# Patient Record
Sex: Female | Born: 1955
Health system: Southern US, Community
[De-identification: ages and names within clinical notes are randomized; demographics above are authoritative.]

## PROBLEM LIST (undated history)

## (undated) DIAGNOSIS — K219 Gastro-esophageal reflux disease without esophagitis: Secondary | ICD-10-CM

## (undated) DIAGNOSIS — IMO0002 Reserved for concepts with insufficient information to code with codable children: Secondary | ICD-10-CM

## (undated) DIAGNOSIS — G5603 Carpal tunnel syndrome, bilateral upper limbs: Secondary | ICD-10-CM

## (undated) DIAGNOSIS — T7840XA Allergy, unspecified, initial encounter: Secondary | ICD-10-CM

## (undated) DIAGNOSIS — F329 Major depressive disorder, single episode, unspecified: Secondary | ICD-10-CM

## (undated) DIAGNOSIS — F419 Anxiety disorder, unspecified: Secondary | ICD-10-CM

## (undated) DIAGNOSIS — M1990 Unspecified osteoarthritis, unspecified site: Secondary | ICD-10-CM

## (undated) DIAGNOSIS — M5430 Sciatica, unspecified side: Secondary | ICD-10-CM

## (undated) DIAGNOSIS — S46009A Unspecified injury of muscle(s) and tendon(s) of the rotator cuff of unspecified shoulder, initial encounter: Secondary | ICD-10-CM

## (undated) DIAGNOSIS — Z720 Tobacco use: Secondary | ICD-10-CM

## (undated) DIAGNOSIS — R0602 Shortness of breath: Secondary | ICD-10-CM

## (undated) DIAGNOSIS — F32A Depression, unspecified: Secondary | ICD-10-CM

## (undated) HISTORY — DX: Anxiety disorder, unspecified: F41.9

## (undated) HISTORY — PX: UPPER GASTROINTESTINAL ENDOSCOPY: SHX188

## (undated) HISTORY — DX: Gastro-esophageal reflux disease without esophagitis: K21.9

## (undated) HISTORY — DX: Sciatica, unspecified side: M54.30

## (undated) HISTORY — DX: Unspecified injury of muscle(s) and tendon(s) of the rotator cuff of unspecified shoulder, initial encounter: S46.009A

## (undated) HISTORY — DX: Unspecified osteoarthritis, unspecified site: M19.90

## (undated) HISTORY — DX: Depression, unspecified: F32.A

## (undated) HISTORY — PX: APPENDECTOMY: SHX54

## (undated) HISTORY — DX: Allergy, unspecified, initial encounter: T78.40XA

## (undated) HISTORY — DX: Carpal tunnel syndrome, bilateral upper limbs: G56.03

## (undated) HISTORY — DX: Tobacco use: Z72.0

## (undated) HISTORY — PX: OTHER SURGICAL HISTORY: SHX169

## (undated) HISTORY — DX: Reserved for concepts with insufficient information to code with codable children: IMO0002

## (undated) HISTORY — PX: EXPLORATORY LAPAROTOMY: SUR591

## (undated) HISTORY — DX: Major depressive disorder, single episode, unspecified: F32.9

---

## 1997-09-13 ENCOUNTER — Emergency Department (HOSPITAL_COMMUNITY): Admission: EM | Admit: 1997-09-13 | Discharge: 1997-09-13 | Payer: Self-pay | Admitting: Emergency Medicine

## 2003-05-22 ENCOUNTER — Ambulatory Visit (HOSPITAL_COMMUNITY): Admission: RE | Admit: 2003-05-22 | Discharge: 2003-05-22 | Payer: Self-pay | Admitting: Family Medicine

## 2003-09-26 ENCOUNTER — Emergency Department (HOSPITAL_COMMUNITY): Admission: EM | Admit: 2003-09-26 | Discharge: 2003-09-26 | Payer: Self-pay | Admitting: Emergency Medicine

## 2007-10-17 ENCOUNTER — Emergency Department (HOSPITAL_COMMUNITY): Admission: EM | Admit: 2007-10-17 | Discharge: 2007-10-17 | Payer: Self-pay | Admitting: Emergency Medicine

## 2008-08-23 ENCOUNTER — Emergency Department (HOSPITAL_COMMUNITY): Admission: EM | Admit: 2008-08-23 | Discharge: 2008-08-23 | Payer: Self-pay | Admitting: Emergency Medicine

## 2008-12-27 ENCOUNTER — Ambulatory Visit: Payer: Self-pay | Admitting: Internal Medicine

## 2008-12-27 ENCOUNTER — Encounter (INDEPENDENT_AMBULATORY_CARE_PROVIDER_SITE_OTHER): Payer: Self-pay | Admitting: Internal Medicine

## 2008-12-27 DIAGNOSIS — F329 Major depressive disorder, single episode, unspecified: Secondary | ICD-10-CM | POA: Insufficient documentation

## 2008-12-30 ENCOUNTER — Emergency Department (HOSPITAL_COMMUNITY): Admission: EM | Admit: 2008-12-30 | Discharge: 2008-12-30 | Payer: Self-pay | Admitting: Emergency Medicine

## 2009-01-21 ENCOUNTER — Ambulatory Visit: Payer: Self-pay | Admitting: Internal Medicine

## 2009-01-21 LAB — CONVERTED CEMR LAB
ALT: 14 units/L (ref 0–35)
BUN: 13 mg/dL (ref 6–23)
CO2: 23 meq/L (ref 19–32)
Calcium: 9.8 mg/dL (ref 8.4–10.5)
Chloride: 108 meq/L (ref 96–112)
Cholesterol: 225 mg/dL — ABNORMAL HIGH (ref 0–200)
Creatinine, Ser: 0.82 mg/dL (ref 0.40–1.20)
Eosinophils Absolute: 0.1 10*3/uL (ref 0.0–0.7)
Eosinophils Relative: 2 % (ref 0–5)
Glucose, Bld: 96 mg/dL (ref 70–99)
HCT: 41.7 % (ref 36.0–46.0)
HDL: 89 mg/dL (ref >39)
Lymphs Abs: 2.4 10*3/uL (ref 0.7–4.0)
MCHC: 31.9 g/dL (ref 30.0–36.0)
MCV: 93.1 fL (ref >=78.0)
Monocytes Absolute: 0.4 10*3/uL (ref 0.1–1.0)
Monocytes Relative: 5 % (ref 3–12)
RBC: 4.48 M/uL (ref 3.87–5.11)
Total CHOL/HDL Ratio: 2.5
Triglycerides: 71 mg/dL (ref <150)
WBC: 7.8 10*3/uL (ref 4.0–10.5)

## 2009-02-08 ENCOUNTER — Ambulatory Visit: Payer: Self-pay | Admitting: Infectious Diseases

## 2009-02-08 DIAGNOSIS — M47816 Spondylosis without myelopathy or radiculopathy, lumbar region: Secondary | ICD-10-CM

## 2010-06-12 LAB — URINALYSIS, ROUTINE W REFLEX MICROSCOPIC
Glucose, UA: NEGATIVE mg/dL
Hgb urine dipstick: NEGATIVE
Protein, ur: NEGATIVE mg/dL

## 2010-07-30 ENCOUNTER — Encounter: Payer: Self-pay | Admitting: Internal Medicine

## 2010-07-30 DIAGNOSIS — G5603 Carpal tunnel syndrome, bilateral upper limbs: Secondary | ICD-10-CM | POA: Insufficient documentation

## 2010-07-30 DIAGNOSIS — S46009A Unspecified injury of muscle(s) and tendon(s) of the rotator cuff of unspecified shoulder, initial encounter: Secondary | ICD-10-CM | POA: Insufficient documentation

## 2010-07-30 DIAGNOSIS — M5430 Sciatica, unspecified side: Secondary | ICD-10-CM | POA: Insufficient documentation

## 2010-09-18 ENCOUNTER — Encounter: Payer: Self-pay | Admitting: Internal Medicine

## 2010-10-27 ENCOUNTER — Encounter: Payer: Self-pay | Admitting: Internal Medicine

## 2010-11-03 ENCOUNTER — Encounter: Payer: Self-pay | Admitting: Internal Medicine

## 2010-12-26 ENCOUNTER — Ambulatory Visit (INDEPENDENT_AMBULATORY_CARE_PROVIDER_SITE_OTHER): Payer: Self-pay | Admitting: Internal Medicine

## 2010-12-26 ENCOUNTER — Encounter: Payer: Self-pay | Admitting: Internal Medicine

## 2010-12-26 VITALS — BP 119/73 | HR 117 | Temp 99.1°F | Wt 177.4 lb

## 2010-12-26 DIAGNOSIS — K219 Gastro-esophageal reflux disease without esophagitis: Secondary | ICD-10-CM | POA: Insufficient documentation

## 2010-12-26 DIAGNOSIS — F329 Major depressive disorder, single episode, unspecified: Secondary | ICD-10-CM

## 2010-12-26 DIAGNOSIS — J45909 Unspecified asthma, uncomplicated: Secondary | ICD-10-CM | POA: Insufficient documentation

## 2010-12-26 DIAGNOSIS — Z23 Encounter for immunization: Secondary | ICD-10-CM

## 2010-12-26 DIAGNOSIS — Z299 Encounter for prophylactic measures, unspecified: Secondary | ICD-10-CM

## 2010-12-26 DIAGNOSIS — F3289 Other specified depressive episodes: Secondary | ICD-10-CM

## 2010-12-26 LAB — CBC
HCT: 40.8 % (ref 36.0–46.0)
Hemoglobin: 13.4 g/dL (ref 12.0–15.0)
MCH: 29.6 pg (ref 26.0–34.0)
MCHC: 32.8 g/dL (ref 30.0–36.0)
MCV: 90.3 fL (ref 78.0–100.0)

## 2010-12-26 MED ORDER — ALBUTEROL SULFATE HFA 108 (90 BASE) MCG/ACT IN AERS: 2.0000 | INHALATION_SPRAY | Freq: Four times a day (QID) | RESPIRATORY_TRACT | Status: DC | PRN

## 2010-12-26 MED ORDER — PREDNISONE (PAK) 10 MG PO TABS: 10.0000 mg | ORAL_TABLET | Freq: Every day | ORAL | Status: AC

## 2010-12-26 MED ORDER — OMEPRAZOLE 40 MG PO CPDR: DELAYED_RELEASE_CAPSULE | ORAL | Status: DC

## 2010-12-26 NOTE — Progress Notes (Signed)
Subjective:   Patient ID: Tabitha Wilcox female   DOB: [birth year] 55 y.o.   MRN: [id number]  HPI: Tabitha Wilcox is a 55 y.o.  Female with PMH significant as outlined below who presented to the clinic for regular office visit and refill of medication. Patient has been here since 2 years since she moved to [place] to help her daughter taking care of the granddaughter. She noted she is doing fine except: 1. Trouble of swolling: this has been going on since 2 years. She noted that taking Prilosec 20 mg daily has improved symptoms somewhat but she continues to have symptoms that something is stuck in her throat. No difference between liquid or solid. She further noted that she had one episode of vomiting and occasionally epigastric pain and nausea. She denies any constipation, diarrhea, weight loss. She does smoke 0.5 pack a day since many years.   2. SOB and dry cough since 2 weeks the patient is using her inhaler more often especially in the night. Denies any fevers, recent travel. Refulx. Stuck things in her throat . Vomiting. Epigastric pain. Vomiting episodes.     Past Medical History  Diagnosis Date  . Asthma   . Carpal tunnel syndrome on both sides   . Rotator cuff injury     Right side  . Sciatica   . Tobacco abuse   . Depression    Current Outpatient Prescriptions  Medication Sig Dispense Refill  . omeprazole (PRILOSEC) 20 MG capsule Take 20 mg by mouth daily. Take one pill by mouth until you take naprosyn       . QUEtiapine (SEROQUEL) 200 MG tablet Take 200 mg by mouth at bedtime.         Family History  Problem Relation Age of Onset  . Colon cancer Maternal Aunt   . Colon cancer Paternal Uncle    History   Social History  . Marital Status: Divorced    Spouse Name: N/A    Number of Children: 3  . Years of Education: N/A   Social History Main Topics  . Smoking status: Smoker, Current Status Unknown -- 0.5 packs/day    Types: Cigarettes  . Smokeless tobacco: None    . Alcohol Use: Yes     Drinks beer occasionally (beer once or twice a week)  . Drug Use: No     stopped since 2 1/2 years. Before using Marijuana.   . Sexually Active: None   Other Topics Concern  . 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 Daughter, 35Son, 33Son, 61 (Deceased, shot at fathers home in 2007)Does not exercise anymore due to foot/sciatica pain   Review of Systems: Constitutional: Denies fever, chills, diaphoresis, appetite change and fatigue.  HEENT: Denies photophobia, eye pain, redness, hearing loss, ear pain, congestion, sore throat, rhinorrhea, sneezing, mouth sores, neck pain, neck stiffness and tinnitus.   Cardiovascular: Denies chest pain, palpitations and leg swelling.  Genitourinary: Denies dysuria, urgency, frequency, hematuria, flank pain and difficulty urinating.  Musculoskeletal: Denies myalgias, back pain, joint swelling, arthralgias and gait problem.  Skin: Denies pallor, rash and wound.  Neurological: Denies dizziness, seizures, syncope, weakness, light-headedness, numbness and headaches.  Hematological: Denies adenopathy. Easy bruising, personal or family bleeding history    Objective:  Physical Exam: Filed Vitals:   12/26/10 1613  BP: 119/73  Pulse: 117  Temp: 99.1 F (37.3 C)  TempSrc: Oral  Weight: 177 lb 6.4 oz (80.468 kg)   Constitutional: Vital signs reviewed.  Patient is a well-developed and well-nourished  in no acute distress and cooperative with exam. Alert and oriented x3.  Mouth: no erythema or exudates, MMM  Neck: Supple, Trachea midline normal ROM, No JVD, mass, thyromegaly,  Cardiovascular: RRR, S1 normal, S2 normal, no MRG, pulses symmetric and intact bilaterally Pulmonary/Chest:  Wheezes bilaterally noted  But no rales, or rhonchi Abdominal: Soft.  Non-tender, non-distended, bowel sounds are normal, no masses, organomegaly, or guarding present.  GU: no CVA tenderness Musculoskeletal: No joint deformities, erythema, or stiffness, ROM full and no nontender Neurological: A&O x3, no focal motor deficit, sensory intact to light touch bilaterally.  Skin: Warm, dry and intact. No rash, cyanosis, or clubbing.

## 2010-12-27 ENCOUNTER — Encounter: Payer: Self-pay | Admitting: Internal Medicine

## 2010-12-27 DIAGNOSIS — Z299 Encounter for prophylactic measures, unspecified: Secondary | ICD-10-CM | POA: Insufficient documentation

## 2010-12-27 LAB — COMPREHENSIVE METABOLIC PANEL
AST: 33 U/L (ref 0–37)
Albumin: 5 g/dL (ref 3.5–5.2)
CO2: 23 mEq/L (ref 19–32)
Glucose, Bld: 123 mg/dL — ABNORMAL HIGH (ref 70–99)
Sodium: 138 mEq/L (ref 135–145)
Total Bilirubin: 0.6 mg/dL (ref 0.3–1.2)
Total Protein: 8 g/dL (ref 6.0–8.3)

## 2010-12-27 LAB — LIPID PANEL
Cholesterol: 208 mg/dL — ABNORMAL HIGH (ref 0–200)
Triglycerides: 105 mg/dL (ref <150)
VLDL: 21 mg/dL (ref 0–40)

## 2010-12-27 NOTE — Assessment & Plan Note (Signed)
Flu vaccination given. Consider mammogram referral and colonoscopy if orange card is approved.

## 2010-12-27 NOTE — Assessment & Plan Note (Signed)
Dysphagia is likley related to uncontrolled GERD. Will increase proazac to 40 mg BID for one month and then 40 mg once a day. If symptoms are uncontrolled at the next office visit consider a Barium Swallow to evaluate Dysmotility , lesion or strictures . Patient has current self pay only.

## 2010-12-27 NOTE — Assessment & Plan Note (Signed)
Patient has no financial recourses at this point to pay for any inhaled steroids to control her Asthma therefore I will prescribe a 10 day course of prednisone will follow up on her insurance situation. She wanted to try to contact Rudell Cobb to assist with orange card. At that point I would consider PFT and start inhaled steroid.

## 2011-02-02 ENCOUNTER — Encounter: Payer: Self-pay | Admitting: Internal Medicine

## 2011-03-12 ENCOUNTER — Ambulatory Visit (INDEPENDENT_AMBULATORY_CARE_PROVIDER_SITE_OTHER): Payer: Self-pay | Admitting: Internal Medicine

## 2011-03-12 ENCOUNTER — Encounter: Payer: Self-pay | Admitting: Internal Medicine

## 2011-03-12 DIAGNOSIS — M25579 Pain in unspecified ankle and joints of unspecified foot: Secondary | ICD-10-CM

## 2011-03-12 DIAGNOSIS — Z299 Encounter for prophylactic measures, unspecified: Secondary | ICD-10-CM

## 2011-03-12 DIAGNOSIS — K222 Esophageal obstruction: Secondary | ICD-10-CM | POA: Insufficient documentation

## 2011-03-12 DIAGNOSIS — R1310 Dysphagia, unspecified: Secondary | ICD-10-CM

## 2011-03-12 NOTE — Assessment & Plan Note (Signed)
Pap smear deferred to next office visit per patient

## 2011-03-12 NOTE — Assessment & Plan Note (Signed)
Patient has completed a one-month course of PPI twice a day which somehow improved her symptoms but as soon as she is only taking once a day her symptoms started to worsen. Obtain them swallow evaluation for any strictures or lesions to assist with management. Patient was noted to have mild elevation of calcium.

## 2011-03-12 NOTE — Progress Notes (Signed)
  Subjective:   Patient ID: Tabitha Wilcox female   DOB: Aug 06, 1955 56 y.o.   MRN: [id number]  HPI: [name] is a 56 y.o. female with past history significant as outlined below who presented to the clinic with bilateral ankle pain. She noted she had sprained her ankle multiple times in the past and since then she feels that her ankles are kind of in stable. As soon as she takes the wrong step she would twist her ankle which happened recently again. She was a noted swelling and pain which was nonsteroidal and elevation. She also reports about pain radiating to her knee and some cramps in her legs.   She was seen during the last office visit for dysphagia. She was started on PPI twice a day. Patient reports that his symptoms had improved slightly but not since she has been taking only once a day she has some episodes where she has the feeling that her food gets stuck . Should be able to get her or in shot next week.     Past Medical History  Diagnosis Date  . Asthma   . Carpal tunnel syndrome on both sides   . Rotator cuff injury     Right side  . Sciatica   . Tobacco abuse   . Depression    Dysphagia    . GERD (gastroesophageal reflux disease)    Current Outpatient Prescriptions  Medication Sig Dispense Refill  . albuterol (PROVENTIL HFA;VENTOLIN HFA) 108 (90 BASE) MCG/ACT inhaler Inhale 2 puffs into the lungs every 6 (six) hours as needed.  1 Inhaler  0  . omeprazole (PRILOSEC) 40 MG capsule Take 40 mg by mouth daily.        . QUEtiapine (SEROQUEL) 200 MG tablet Take 200 mg by mouth at bedtime.         Review of Systems: Constitutional: Denies fever, chills, diaphoresis, appetite change and fatigue.  HEENT: Denies , congestion, sore throat, rhinorrhea, sneezing, mouth sores,  neck pain, neck stiffness and tinnitus.   Respiratory: Denies SOB, DOE, cough, chest tightness,  and wheezing.   Cardiovascular: Denies chest pain, palpitations and leg swelling.  Gastrointestinal: Denies  nausea, vomiting, abdominal pain, diarrhea, constipation, blood in stool and abdominal distention.  Skin: Denies pallor, rash and wound.   Objective:  Physical Exam: Filed Vitals:   03/12/11 1542  BP: 127/81  Pulse: 90  Temp: 97.5 F (36.4 C)  Height: 5\' 2"  (1.575 m)  Weight: 178 lb (80.74 kg)  SpO2: 96%   Constitutional: Vital signs reviewed.  Patient is a well-developed and well-nourished female in no acute distress and cooperative with exam. Alert and oriented x3.  Mouth: no erythema or exudates, MMM  Neck: Supple,  Cardiovascular: RRR, S1 normal, S2 normal, no MRG, pulses symmetric and intact bilaterally Pulmonary/Chest: CTAB, no wheezes, rales, or rhonchi Abdominal: Soft. Non-tender, non-distended, bowel sounds are normal, no masses, organomegaly, or guarding present.  Musculoskeletal: Bialteral joint swelling noted ,without erythema, or stiffness. Normal ROM full  Neurological: A&O x3, no focal motor deficit, sensory intact to light touch bilaterally.  Skin: Warm, dry and intact. No rash, cyanosis, or clubbing.

## 2011-03-12 NOTE — Assessment & Plan Note (Signed)
Noted bilateral pain. I will obtain x-rays of the ankle and recommended bandages or splints over the counter. Furthermore recommended when necessary nonsteroidal if needed. May consider to refer her to sports medicine as soon as patient has insurance.

## 2011-03-19 ENCOUNTER — Other Ambulatory Visit: Payer: Self-pay | Admitting: Internal Medicine

## 2011-03-19 DIAGNOSIS — M25579 Pain in unspecified ankle and joints of unspecified foot: Secondary | ICD-10-CM

## 2011-03-19 NOTE — Assessment & Plan Note (Signed)
She received orange card. He is also now complaining about knee pain when she walked to the front desk. I will refer patient for further evaluation to sports medicine. I would not obtain the x-rays at this point tender for management to sports medicine. Appreciate their assistance.

## 2011-03-24 ENCOUNTER — Other Ambulatory Visit (HOSPITAL_COMMUNITY): Payer: Self-pay

## 2011-03-27 ENCOUNTER — Ambulatory Visit (HOSPITAL_COMMUNITY)
Admission: RE | Admit: 2011-03-27 | Discharge: 2011-03-27 | Disposition: A | Discharge status: Home / Self Care | Payer: Self-pay | Source: Ambulatory Visit | Attending: Internal Medicine | Admitting: Internal Medicine

## 2011-03-27 DIAGNOSIS — R1310 Dysphagia, unspecified: Secondary | ICD-10-CM | POA: Insufficient documentation

## 2011-03-30 ENCOUNTER — Ambulatory Visit (INDEPENDENT_AMBULATORY_CARE_PROVIDER_SITE_OTHER): Payer: Self-pay | Admitting: Family Medicine

## 2011-03-30 VITALS — BP 130/70 | Ht 62.0 in | Wt 178.0 lb

## 2011-03-30 DIAGNOSIS — M25579 Pain in unspecified ankle and joints of unspecified foot: Secondary | ICD-10-CM

## 2011-03-30 DIAGNOSIS — M25562 Pain in left knee: Secondary | ICD-10-CM

## 2011-03-30 DIAGNOSIS — M25569 Pain in unspecified knee: Secondary | ICD-10-CM

## 2011-03-30 MED ORDER — MELOXICAM 7.5 MG PO TABS: 7.5000 mg | ORAL_TABLET | Freq: Every day | ORAL | Status: DC

## 2011-03-31 DIAGNOSIS — M25561 Pain in right knee: Secondary | ICD-10-CM | POA: Insufficient documentation

## 2011-03-31 NOTE — Progress Notes (Signed)
  Subjective:    Patient ID: Tabitha Wilcox, female    DOB: April 19, 1955, 56 y.o.   MRN: [id number]  HPI  Pain in bilateral knees right greater than left and bilateral ankles for 5 or more years. His head at least one prior sprain on the right ankle but no other significant injuries to either ankles or knees.  Ankles feel weak. After she does a lot of walking they ache. She feels like they're unstable. The right knee hurts worse than the left feels stiff and sometimes swollen. She has not noted any erythema or redness of the knee.  Review of Systems Denies unusual weight change, fever, sweats, chills. She does admit to bilateral lower extremity burning sensation at night that's intermittent and goes from her knees down to her ankles and toes.    Objective:   Physical Exam  Vital signs reviewed. GENERAL: Well developed, well nourished, no acute distress Knees: Right knee has a well-healed old superficial scar above the patella. There is mild tenderness to palpation of the lateral joint line greater than the medial joint line. There is no effusion. There is some crepitus. Bilaterally hair knees are ligamentously intact to varus and valgus stress, anterior drawer. Negative memory bilaterally. The left knee has some crepitus but the skin over the kneecap is normal. She has full range of motion in flexion and extension of both knees. ANKLES: Anterior drawer has distinct endpoint on the right, slightly less distinct endpoint on the left. Ankles are otherwise ligamentously intact. There is no significant joint swelling, no warmth or erythema. Sensation is intact to soft touch in bilateral feet. She is unable to do a heel raise and double leg stance. She cannot do heel raise and single stance on either foot. LOWER leg: Bilateral calves are soft. The popliteal space bilaterally is normal.      Assessment & Plan:  Multiple joint pains. I think the ankle issues are related to generalized weakness and have  put her on a home exercise program. I will see her back in 2-3 weeks for followup of that #2. She has some joint line tenderness on the right knee. Will get bilateral standing knees to evaluate for joint space loss. I'll see her back in 2-3 weeks

## 2011-04-01 ENCOUNTER — Ambulatory Visit (HOSPITAL_COMMUNITY)
Admission: RE | Admit: 2011-04-01 | Discharge: 2011-04-01 | Disposition: A | Discharge status: Home / Self Care | Payer: Self-pay | Source: Ambulatory Visit | Attending: Family Medicine | Admitting: Family Medicine

## 2011-04-01 DIAGNOSIS — IMO0002 Reserved for concepts with insufficient information to code with codable children: Secondary | ICD-10-CM | POA: Insufficient documentation

## 2011-04-01 DIAGNOSIS — M25569 Pain in unspecified knee: Secondary | ICD-10-CM | POA: Insufficient documentation

## 2011-04-01 DIAGNOSIS — M25561 Pain in right knee: Secondary | ICD-10-CM

## 2011-04-01 DIAGNOSIS — M1710 Unilateral primary osteoarthritis, unspecified knee: Secondary | ICD-10-CM | POA: Insufficient documentation

## 2011-04-13 ENCOUNTER — Encounter: Payer: Self-pay | Admitting: Internal Medicine

## 2011-04-13 ENCOUNTER — Ambulatory Visit (INDEPENDENT_AMBULATORY_CARE_PROVIDER_SITE_OTHER): Payer: Self-pay | Admitting: Family Medicine

## 2011-04-13 ENCOUNTER — Ambulatory Visit (INDEPENDENT_AMBULATORY_CARE_PROVIDER_SITE_OTHER): Payer: Self-pay | Admitting: Internal Medicine

## 2011-04-13 DIAGNOSIS — R1310 Dysphagia, unspecified: Secondary | ICD-10-CM

## 2011-04-13 DIAGNOSIS — F17200 Nicotine dependence, unspecified, uncomplicated: Secondary | ICD-10-CM

## 2011-04-13 DIAGNOSIS — J45909 Unspecified asthma, uncomplicated: Secondary | ICD-10-CM

## 2011-04-13 DIAGNOSIS — M25579 Pain in unspecified ankle and joints of unspecified foot: Secondary | ICD-10-CM

## 2011-04-13 DIAGNOSIS — Z720 Tobacco use: Secondary | ICD-10-CM | POA: Insufficient documentation

## 2011-04-13 DIAGNOSIS — M25562 Pain in left knee: Secondary | ICD-10-CM

## 2011-04-13 DIAGNOSIS — M25569 Pain in unspecified knee: Secondary | ICD-10-CM

## 2011-04-13 DIAGNOSIS — Z1231 Encounter for screening mammogram for malignant neoplasm of breast: Secondary | ICD-10-CM | POA: Insufficient documentation

## 2011-04-13 MED ORDER — MELOXICAM 15 MG PO TABS: ORAL_TABLET | ORAL | Status: DC

## 2011-04-13 MED ORDER — ALBUTEROL SULFATE HFA 108 (90 BASE) MCG/ACT IN AERS: 2.0000 | INHALATION_SPRAY | Freq: Four times a day (QID) | RESPIRATORY_TRACT | Status: DC | PRN

## 2011-04-13 NOTE — Patient Instructions (Signed)
Increase your meloxicam to twice a day (total of 15 mg a day) ADD two 500 mg tylenol --take two tabs three times a day. If you start having worse stomach problems, stop the meloxicam and call me. See me in 3 months. GREAT work on the ankle exercises---start the knee rehab!

## 2011-04-13 NOTE — Assessment & Plan Note (Signed)
Will refer patient for mammogram. 

## 2011-04-13 NOTE — Assessment & Plan Note (Signed)
History of asthma but no PFT in the records. The patient noted using albuterol more often. I will obtain PFT for further evaluation and management.

## 2011-04-13 NOTE — Progress Notes (Signed)
Subjective:   Patient ID: Tabitha Wilcox female   DOB: 1955/04/23 56 y.o.   MRN: [id number]  HPI: [name] is a 56 y.o. female with past medical history significant as outlined below who presented to the clinic for an office visit. 1. Ankle and knee pain: Patient was evaluated by sports medicine and was provided Mobic initiate 7.5 mg now increased to 50 mg. Furthermore recommended stretching exercises which improved patient's ankle swelling and pain. X-rays of the knees was obtained which showed some mild degenerative disease. 2. Dysphagia; Barium swallow was obtained which showed mild smooth stricture in gastroesophageal junction it did  not reproduce her symptoms. She is currently drinking a lot of fluids if she is taking any medication and cutting her food into small pieces to prevent feeling of swallow  Problems. 3. Tobacco abuse: has made a quit date with the help of 1-800-QUIT NOW number. The date is 04/18/11    Past Medical History  Diagnosis Date  . Asthma   . Carpal tunnel syndrome on both sides   . Rotator cuff injury     Right side  . Sciatica   . Tobacco abuse   . Depression   . Diabetes mellitus   . GERD (gastroesophageal reflux disease)    Current Outpatient Prescriptions  Medication Sig Dispense Refill  . albuterol (PROVENTIL HFA;VENTOLIN HFA) 108 (90 BASE) MCG/ACT inhaler Inhale 2 puffs into the lungs every 6 (six) hours as needed.  1 Inhaler  0  . meloxicam (MOBIC) 15 MG tablet Take 7.5 mg (one half tablet) by mouth twice daily  30 tablet  3  . omeprazole (PRILOSEC) 40 MG capsule Take 40 mg by mouth daily.        . QUEtiapine (SEROQUEL) 200 MG tablet Take 200 mg by mouth at bedtime.         Family History  Problem Relation Age of Onset  . Colon cancer Maternal Aunt   . Colon cancer Paternal Uncle    History   Social History  . Marital Status: Divorced    Spouse Name: N/A    Number of Children: 3  . Years of Education: N/A   Social History Main Topics   . Smoking status: Smoker, Current Status Unknown -- 0.5 packs/day    Types: Cigarettes  . Smokeless tobacco: None   Comment: Planning on quitting soon.  . Alcohol Use: Yes     Drinks beer occasionally (beer once or twice a week)  . Drug Use: No     stopped since 2 1/2 years. Before using Marijuana.   . Sexually Active: None   Other Topics Concern  . 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 Daughter, 35Son, 33Son, 74 (Deceased, shot at fathers home in 2007)Does not exercise anymore due to foot/sciatica pain   Review of Systems: Constitutional: Denies fever, chills, diaphoresis, appetite change and fatigue.  Respiratory: Denies SOB, DOE, cough, chest tightness,  and wheezing.   Cardiovascular: Denies chest pain, palpitations and leg swelling.  Gastrointestinal: Denies nausea, vomiting, abdominal pain, diarrhea, constipation, blood in stool and abdominal distention.  Genitourinary: Denies dysuria, urgency, frequency, hematuria, flank pain and difficulty urinating.  Skin: Denies pallor, rash and wound.  Neurological: Denies dizziness,  numbness and headaches.    Objective:  Physical Exam: Filed Vitals:   04/13/11 1440  BP: 132/82  Pulse: 90  Temp: 97.3 F (36.3 C)  TempSrc: Oral  Height: 5\' 3"  (1.6 m)  Weight: 176 lb 12.8 oz (80.196 kg)   Constitutional: Vital signs reviewed.  Patient is a well-developed and well-nourished  in no acute distress and cooperative with exam. Alert and oriented x3.  Neck: Supple,  Cardiovascular: RRR, S1 normal, S2 normal, no MRG, pulses symmetric and intact bilaterally Pulmonary/Chest: CTAB, no wheezes, rales, or rhonchi Abdominal: Soft. Non-tender, non-distended, bowel sounds are normal,  GU: no CVA tenderness Musculoskeletal: No joint deformities, erythema, or  stiffness, ROM full and no nontender Hematology: no cervical,  Neurological: A&O x3, Strenght is normal and symmetric bilaterally, , no focal motor deficit, sensory intact to light touch bilaterally.  Skin: Warm, dry and intact. No rash, cyanosis, or clubbing.  Psychiatric: Normal mood and affect. speech and behavior is normal.

## 2011-04-13 NOTE — Assessment & Plan Note (Signed)
Patient has changed her habits to improve her symptoms which is concerning with her age and history of tobacco abuse. Barium swallow evaluation did not show any acute process. For the evaluation bowel refer patient to gastroenterology for possible EGD.

## 2011-04-13 NOTE — Progress Notes (Signed)
  Subjective:    Patient ID: Tabitha Wilcox, female    DOB: Feb 13, 1956, 56 y.o.   MRN: [id number]  HPI  #1 ankle pain. Improved. She has been doing her exercises each day and already her ankles feel better, there is swelling less and they feel stronger. #2. Followup bilateral knee pain. She went for the x-rays. Continues to have knee pain that is worse at night, worse with walking up stairs or standing for long periods of time. Sometimes it hurts as much as 8-9/10 but many days she will have pain only 2-3/10.  Review of Systems Denies fever, sweats, chills.    Objective:   Physical Exam   Vital signs reviewed. GENERAL: Well developed, well nourished, no acute distress ANKLES: She can now do 6 or 7 heel raises versus at last visit she can only do one. Ankles are ligamentously intact. There is no edema or swelling. No tenderness to palpation. KNEES: No swelling noted. Full range of motion extension and flexion. Bilateral BP standing knees reveal moderate osteoarthritis a little worse on the right. There is some joint narrowing bilaterally, some sclerosis and on the right there is some osteophytes.       Assessment & Plan  :#1. Ankle pain improved with rehabilitation. I would continue rehabilitation until she can get up to single stance heel raise on each foot of #20. Does not think she can back off to doing those exercises 2 or 3 times a week #2. Mild to moderate osteoarthritis knees. We'll put her on a quad strengthening program. Tylenol for knee pain in addition to her meloxicam. I will increase her meloxicam to 15 mg a day as the 7.5 did not seem to help much. Does have a history of some reflux and has previously been on omeprazole so we discussed red plaques to stop medication. I will see her back in 3-4 months. I do not think there is any need to do ankle x-rays.

## 2011-04-13 NOTE — Assessment & Plan Note (Signed)
Has made a quit date with the help of 1-800-QUIT NOW number. The date is 04/18/11. She will be using nicotine patches

## 2011-04-14 LAB — BASIC METABOLIC PANEL
BUN: 11 mg/dL (ref 6–23)
CO2: 25 mEq/L (ref 19–32)
Calcium: 10.1 mg/dL (ref 8.4–10.5)
Creat: 0.71 mg/dL (ref 0.50–1.10)
Glucose, Bld: 130 mg/dL — ABNORMAL HIGH (ref 70–99)

## 2011-04-20 NOTE — Progress Notes (Signed)
Addended by: Dorie Rank E on: 04/20/2011 02:00 PM   Modules accepted: Orders

## 2011-04-22 ENCOUNTER — Other Ambulatory Visit: Payer: Self-pay | Admitting: *Deleted

## 2011-04-22 ENCOUNTER — Ambulatory Visit (HOSPITAL_COMMUNITY)
Admission: RE | Admit: 2011-04-22 | Discharge: 2011-04-22 | Disposition: A | Discharge status: Home / Self Care | Payer: Self-pay | Source: Ambulatory Visit | Attending: Internal Medicine | Admitting: Internal Medicine

## 2011-04-22 DIAGNOSIS — J45909 Unspecified asthma, uncomplicated: Secondary | ICD-10-CM | POA: Insufficient documentation

## 2011-04-22 MED ORDER — ALBUTEROL SULFATE (5 MG/ML) 0.5% IN NEBU
2.5000 mg | INHALATION_SOLUTION | Freq: Once | RESPIRATORY_TRACT | Status: AC
  Administered 2011-04-22: 2.5 mg via RESPIRATORY_TRACT

## 2011-04-22 MED ORDER — MELOXICAM 15 MG PO TABS: ORAL_TABLET | ORAL | Status: DC

## 2011-04-24 ENCOUNTER — Telehealth: Payer: Self-pay | Admitting: *Deleted

## 2011-04-24 NOTE — Telephone Encounter (Signed)
Spoke with cathy at the [facility] and she informed me that pt lost her rx for albuterol and mobic on the bus. Gave her permission to refill.Loralee Pacas Malo

## 2011-05-13 ENCOUNTER — Ambulatory Visit (HOSPITAL_COMMUNITY)
Admission: RE | Admit: 2011-05-13 | Discharge: 2011-05-13 | Disposition: A | Discharge status: Home / Self Care | Payer: Self-pay | Source: Ambulatory Visit | Attending: Internal Medicine | Admitting: Internal Medicine

## 2011-05-13 DIAGNOSIS — Z1231 Encounter for screening mammogram for malignant neoplasm of breast: Secondary | ICD-10-CM | POA: Insufficient documentation

## 2011-05-14 ENCOUNTER — Encounter: Payer: Self-pay | Admitting: Gastroenterology

## 2011-05-18 ENCOUNTER — Other Ambulatory Visit: Payer: Self-pay | Admitting: Internal Medicine

## 2011-05-18 DIAGNOSIS — R928 Other abnormal and inconclusive findings on diagnostic imaging of breast: Secondary | ICD-10-CM

## 2011-05-25 ENCOUNTER — Ambulatory Visit
Admission: RE | Admit: 2011-05-25 | Discharge: 2011-05-25 | Disposition: A | Discharge status: Home / Self Care | Payer: Self-pay | Source: Ambulatory Visit | Attending: Internal Medicine | Admitting: Internal Medicine

## 2011-05-25 DIAGNOSIS — R928 Other abnormal and inconclusive findings on diagnostic imaging of breast: Secondary | ICD-10-CM

## 2011-06-08 ENCOUNTER — Telehealth: Payer: Self-pay | Admitting: *Deleted

## 2011-06-08 ENCOUNTER — Ambulatory Visit: Payer: Self-pay | Admitting: Gastroenterology

## 2011-06-08 NOTE — Telephone Encounter (Signed)
Called pt. Gradual onset of sxs. Sxs only from shoulder to elbow. Numb & pins and needles but now more dull ache. Feels heavy or like it is asleep, Can move it and raise arm above head. Hand Ok - no weakness. No injuries. Never had these sx before. Highly unlikely that this is a CVA so no need to go to ER. Could be nerve impingement but no muscular weakness so can wait for an appt here. Eval here -first opening is Wed and [facility] is making appt.

## 2011-06-08 NOTE — Telephone Encounter (Signed)
Appointment given for Wed

## 2011-06-08 NOTE — Telephone Encounter (Signed)
Pt called with dull pain, numbness to left arm.  Onset 1 week ago, on and off but is lasting longer.  She states her arm feels like heavy weight. Also feels fuzzy headed and blurred vision on and off. Nothing seems to improve this. No known injury. Please advise.

## 2011-06-10 ENCOUNTER — Ambulatory Visit (INDEPENDENT_AMBULATORY_CARE_PROVIDER_SITE_OTHER): Payer: Self-pay | Admitting: Internal Medicine

## 2011-06-10 ENCOUNTER — Encounter: Payer: Self-pay | Admitting: Internal Medicine

## 2011-06-10 VITALS — BP 138/86 | HR 97 | Temp 97.5°F | Ht 62.0 in | Wt 182.7 lb

## 2011-06-10 DIAGNOSIS — R209 Unspecified disturbances of skin sensation: Secondary | ICD-10-CM

## 2011-06-10 DIAGNOSIS — R200 Anesthesia of skin: Secondary | ICD-10-CM

## 2011-06-10 MED ORDER — ESOMEPRAZOLE MAGNESIUM 40 MG PO CPDR: 40.0000 mg | DELAYED_RELEASE_CAPSULE | Freq: Every day | ORAL | Status: DC

## 2011-06-10 MED ORDER — ALBUTEROL SULFATE HFA 108 (90 BASE) MCG/ACT IN AERS: 2.0000 | INHALATION_SPRAY | Freq: Four times a day (QID) | RESPIRATORY_TRACT | Status: DC | PRN

## 2011-06-10 NOTE — Progress Notes (Signed)
Patient ID: Tabitha Wilcox, female   DOB: [birth year], 56 y.o.   MRN: [id number]  56 year old woman with past medical history listed comes to clinic complaining numbness in the left arm Ongoing for past one week Episodic, multiple episodes during the day. Exacerbating or relieving factors. No pattern to the episodes Never had this problem before Gradually worsening No numbness or weakness in the forearm or hand No pain in the shoulder or back Right arm normal  Physical exam  Gen.- No acute distress CVS- regular rate and rhythm Respiratory- clear to auscultation bilaterally Extremities- right upper- no abnormality on examination, normal strength and sensations throughout the right upper extremities, full range of motion at the shoulder and elbow joint Neuro- nonfocal  Review of system - As per history of present illness

## 2011-06-10 NOTE — Assessment & Plan Note (Signed)
See history of present illness for details Unsure about the diagnosis, but neurological deficits absent No muscular weakness  Plan - Check ESR - TSH, chemistry, CBC normal before - Advised stretching exercise - Followup in the clinic next week if continues to get worse. Consider nerve conduction studies at that time\ - No signs or symptoms suggestive of strokes a CT scan not warranted at this time

## 2011-06-10 NOTE — Patient Instructions (Signed)
Continue to perform stretching exercise as you are doing Call me back next week if no improvement

## 2011-06-11 ENCOUNTER — Encounter: Payer: Self-pay | Admitting: Gastroenterology

## 2011-07-01 ENCOUNTER — Other Ambulatory Visit: Payer: Self-pay | Admitting: *Deleted

## 2011-07-01 MED ORDER — MELOXICAM 15 MG PO TABS: ORAL_TABLET | ORAL | Status: DC

## 2011-07-08 ENCOUNTER — Ambulatory Visit: Payer: Self-pay | Admitting: Gastroenterology

## 2011-07-08 NOTE — Progress Notes (Signed)
Addended by: Neomia Dear on: 07/08/2011 05:24 PM   Modules accepted: Orders

## 2011-07-13 ENCOUNTER — Ambulatory Visit (INDEPENDENT_AMBULATORY_CARE_PROVIDER_SITE_OTHER): Payer: Self-pay | Admitting: Family Medicine

## 2011-07-13 VITALS — BP 110/70

## 2011-07-13 DIAGNOSIS — IMO0002 Reserved for concepts with insufficient information to code with codable children: Secondary | ICD-10-CM

## 2011-07-13 DIAGNOSIS — S83249A Other tear of medial meniscus, current injury, unspecified knee, initial encounter: Secondary | ICD-10-CM

## 2011-07-14 DIAGNOSIS — S83249A Other tear of medial meniscus, current injury, unspecified knee, initial encounter: Secondary | ICD-10-CM | POA: Insufficient documentation

## 2011-07-14 NOTE — Progress Notes (Signed)
  Subjective:    Patient ID: Tabitha Wilcox, female    DOB: June 25, 1955, 56 y.o.   MRN: [id number]  HPI  Followup bilateral knee pain. We have started her on a quadriceps strengthening program which seemed to help for a while. Over the last for 6 weeks he's had increasing pain with particular her left knee when she climbs stairs or does a lot of walking. Has aching at night which is keeping her awake. Has had some intermittent swelling in both knees  that usually resolves overnight. No new injury. These have not been giving way. There is some clicking sensation of sticking he she's been seated for quite a long time.  PERTINENT  PMH / PSH: No prior knee surgeries.  Review of Systems Denies unusual weight change, fever, sweats, chills. Denies calf pain.    Objective:   Physical Exam   GENERAL: Well-developed female no acute distress vital signs are reviewed KNEES: Bilaterally she has full range of motion in extension and flexion. To palpation of the left medial joint line and this reproduces some of the acute sharp pain (not the constant ache). There is a slight effusion on the left knee. Ligamentously intact to varus and valgus stress. Lachman is normal. McMurray is normal.   ULTRASOUND:  Left knee patellar and quadriceps tendons are intact. The lateral meniscus appears benign although not well defined. The medial meniscus appears to be torn and is slightly protruding. There is some surrounding fluid.    INJECTION: Patient was given informed consent, signed copy in the chart. Appropriate time out was taken. Area prepped and draped in usual sterile fashion. One cc of methylprednisolone 40 mg/ml plus  4 cc of 1% lidocaine without epinephrine was injected into the left knee joint using a(n) anterior medial approach. The patient tolerated the procedure well. There were no complications. Post procedure instructions were given.  Assessment & Plan:  #1. Left knee pain and ultrasound consistent with  medial meniscal tear. We discussed options. We'll try corticosteroid injection. She is very done a home exercise program that was quite beneficial in the past and we'll restart that when she had some resolution of her pain. I'll see her back in 4-6 weeks and when necessary.

## 2011-08-10 ENCOUNTER — Ambulatory Visit (INDEPENDENT_AMBULATORY_CARE_PROVIDER_SITE_OTHER): Payer: Self-pay | Admitting: Internal Medicine

## 2011-08-10 ENCOUNTER — Encounter: Payer: Self-pay | Admitting: Internal Medicine

## 2011-08-10 VITALS — BP 131/80 | HR 73 | Temp 98.1°F | Ht 62.0 in | Wt 174.9 lb

## 2011-08-10 DIAGNOSIS — K219 Gastro-esophageal reflux disease without esophagitis: Secondary | ICD-10-CM

## 2011-08-10 DIAGNOSIS — R209 Unspecified disturbances of skin sensation: Secondary | ICD-10-CM

## 2011-08-10 DIAGNOSIS — M25562 Pain in left knee: Secondary | ICD-10-CM

## 2011-08-10 DIAGNOSIS — R200 Anesthesia of skin: Secondary | ICD-10-CM

## 2011-08-10 DIAGNOSIS — M25569 Pain in unspecified knee: Secondary | ICD-10-CM

## 2011-08-10 LAB — CBC
HCT: 36 % (ref 36.0–46.0)
MCH: 29.4 pg (ref 26.0–34.0)
Platelets: 325 10*3/uL (ref 150–400)
RBC: 4.19 MIL/uL (ref 3.87–5.11)
RDW: 14.3 % (ref 11.5–15.5)
WBC: 7.4 10*3/uL (ref 4.0–10.5)

## 2011-08-14 ENCOUNTER — Ambulatory Visit (INDEPENDENT_AMBULATORY_CARE_PROVIDER_SITE_OTHER): Payer: Self-pay | Admitting: Family Medicine

## 2011-08-14 VITALS — BP 120/70

## 2011-08-14 DIAGNOSIS — S83249A Other tear of medial meniscus, current injury, unspecified knee, initial encounter: Secondary | ICD-10-CM

## 2011-08-14 DIAGNOSIS — IMO0002 Reserved for concepts with insufficient information to code with codable children: Secondary | ICD-10-CM

## 2011-08-14 NOTE — Progress Notes (Signed)
  Subjective:    Patient ID: Tabitha Wilcox, female    DOB: [birth year], 56 y.o.   MRN: [id number]  HPI  Followup left knee pain with ultrasound consistent with meniscal tear. At last office visit we did a corticosteroid injection. She is 95% better. She has decreased her meloxicam to one half tab daily.  Review of Systems Denies redness, warmth or swelling of the left knee joint.    Objective:   Physical Exam  Vital signs reviewed. GENERAL: Well developed, well nourished, no acute distress KNEES: Full range of motion in extension bilaterally. Left knee is without any sign of effusion, erythema, warmth.      Assessment & Plan:  #1. Knee pain significantly improved. Likely meniscal tear. I agree with decreasing her meloxicam to the lowest dose that will keep her pain a day. Hopefully she will have spent many months of relief. She will return when necessary. We did discuss that corticosteroid injections should not be more often then every 3 months and in fact should be space as far apart as she can given her pain level. She's quite satisfied with the results

## 2011-08-14 NOTE — Assessment & Plan Note (Signed)
95% improved with CSI

## 2011-08-17 NOTE — Progress Notes (Signed)
Subjective:   Patient ID: Tabitha Wilcox female   DOB: October 21, 1955 56 y.o.   MRN: [id number]  HPI: [name] is a 56 y.o. female with past medical history significant as outlined below who presented to the clinic for regular followup. Patient was evaluated a month ago for right  arm numbness. Patient reports this has mildly improved but is still persistent. She further complains about bilateral knee pain but is followed up by Dr. Lloyd Huger (sports medicine.) Has been doing exercises which has somewhat improved her symptoms.    Past Medical History  Diagnosis Date  . Asthma   . Carpal tunnel syndrome on both sides   . Rotator cuff injury     Right side  . Sciatica   . Tobacco abuse   . Depression   . Diabetes mellitus   . GERD (gastroesophageal reflux disease)    Current Outpatient Prescriptions  Medication Sig Dispense Refill  . FLUoxetine (PROZAC) 20 MG capsule Take 20 mg by mouth daily. Prescribed by Dr Biagio Borg      . meloxicam (MOBIC) 15 MG tablet Take 7.5 mg (one half tablet) by mouth twice daily  30 tablet  3  . traZODone (DESYREL) 100 MG tablet Take 100 mg by mouth at bedtime. Prescribed Dr Biagio Borg      . albuterol (PROVENTIL HFA;VENTOLIN HFA) 108 (90 BASE) MCG/ACT inhaler Inhale 2 puffs into the lungs every 6 (six) hours as needed.  1 Inhaler  2  . esomeprazole (NEXIUM) 40 MG capsule Take 1 capsule (40 mg total) by mouth daily.  30 capsule  1   Family History  Problem Relation Age of Onset  . Colon cancer Maternal Aunt   . Colon cancer Paternal Uncle    History   Social History  . Marital Status: Divorced    Spouse Name: N/A    Number of Children: 3  . Years of Education: N/A   Social History Main Topics  . Smoking status: Smoker, Current Status Unknown -- 0.5 packs/day    Types: Cigarettes  . Smokeless tobacco: None   Comment: Planning on quitting soon.  . Alcohol Use: Yes     Drinks beer occasionally (beer once or twice a week)  . Drug Use: No       stopped since 2 1/2 years. Before using Marijuana.   . Sexually Active: None   Other Topics Concern  . 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 Daughter, 35Son, 33Son, 78 (Deceased, shot at fathers home in 2007)Does not exercise anymore due to foot/sciatica pain   Review of Systems: Positive if bold .  Constitutional:  fever, chills, diaphoresis, appetite change and fatigue.  Respiratory:SOB, DOE, cough, chest tightness,  and wheezing.   Cardiovascular:  chest pain, palpitations and leg swelling.  Gastrointestinal:  nausea, vomiting, abdominal pain, diarrhea, constipation, blood in stool and abdominal distention.  Genitourinary:  dysuria, urgency, frequency, hematuria, flank pain and difficulty urinating.  Musculoskeletal: myalgias, back pain, joint swelling, arthralgias and gait problem.  Skin:  pallor, rash and wound.  Neurological: dizziness, seizures, syncope, weakness, light-headedness, numbness and headaches.    Objective:  Physical Exam: Filed Vitals:   08/10/11 1436  BP: 131/80  Pulse: 73  Temp: 98.1 F (36.7 C)  TempSrc: Oral  Height: 5\' 2"  (1.575 m)  Weight: 174 lb 14.4  oz (79.334 kg)  SpO2: 99%   Constitutional: Vital signs reviewed.  Patient is a well-developed and well-nourished woman in no acute distress and cooperative with exam. Alert and oriented x3.  Neck: Supple,  Cardiovascular: RRR, S1 normal, S2 normal, no MRG, pulses symmetric and intact bilaterally Pulmonary/Chest: CTAB, no wheezes, rales, or rhonchi Abdominal: Soft. Non-tender, non-distended, bowel sounds are normal, no masses, organomegaly, or guarding present.  Musculoskeletal: No joint deformities, erythema, or stiffness, ROM full and no nontender Neurological: A&O x3, Strenght is normal and symmetric bilaterally,  no  focal motor deficit, sensory intact to light touch bilaterally.  Skin: Warm, dry and intact. No rash, cyanosis, or clubbing.

## 2011-08-17 NOTE — Assessment & Plan Note (Signed)
Unclear etiology. ESR, TSH, chemistry and CBC are within normal limits. May need to consider nerve conduction study in the future. Patient has no insurance therefore be difficult to schedule the study. Will continue to monitor.

## 2011-08-17 NOTE — Assessment & Plan Note (Signed)
Patient will followup with Dr. Jennette Kettle  (Sports medicine ). Patient was found to have meniscal tear and is currently doing special exercises.

## 2011-08-19 ENCOUNTER — Ambulatory Visit (INDEPENDENT_AMBULATORY_CARE_PROVIDER_SITE_OTHER): Payer: Self-pay | Admitting: Gastroenterology

## 2011-08-19 ENCOUNTER — Encounter: Payer: Self-pay | Admitting: Gastroenterology

## 2011-08-19 VITALS — BP 124/68 | HR 88 | Ht 62.0 in | Wt 169.0 lb

## 2011-08-19 DIAGNOSIS — Z1211 Encounter for screening for malignant neoplasm of colon: Secondary | ICD-10-CM | POA: Insufficient documentation

## 2011-08-19 DIAGNOSIS — K219 Gastro-esophageal reflux disease without esophagitis: Secondary | ICD-10-CM

## 2011-08-19 NOTE — Assessment & Plan Note (Addendum)
I suspect that she has an early esophageal stricture as evidenced by the hang up of a barium tablet despite the absence of a frank stricture by barium swallow.  Recommendations #1 upper endoscopy with dilatation as indicated  Risks, alternatives, and complications of the procedure, including bleeding, perforation, and possible need for surgery, were explained to the patient.  Patient's questions were answered.

## 2011-08-19 NOTE — Progress Notes (Signed)
History of Present Illness:  [name] is a pleasant 56 year old white female for at the request of Dr. Loistine Chance for evaluation of dysphagia. For over a year he's been complaining of dysphagia to solids and, most recently, liquids. She frequently regurgitates her foods.  Esophagram in January, 2013, which I reviewed, demonstrated transient holdup of a barium tablet but no frank strictures.  She has minimal pyrosis with careful dietary restrictions and not eating late at night. She is on omeprazole.  She does complain of intermittent hoarseness.      Past Medical History  Diagnosis Date  . Asthma   . Carpal tunnel syndrome on both sides   . Rotator cuff injury     Right side  . Sciatica   . Tobacco abuse   . Depression   . GERD (gastroesophageal reflux disease)   . Alcoholism   . Anxiety    Past Surgical History  Procedure Date  . Appendectomy    family history includes Colon cancer in her maternal aunt and paternal uncle. Current Outpatient Prescriptions  Medication Sig Dispense Refill  . albuterol (PROVENTIL HFA;VENTOLIN HFA) 108 (90 BASE) MCG/ACT inhaler Inhale 2 puffs into the lungs every 6 (six) hours as needed.  1 Inhaler  2  . AMBULATORY NON FORMULARY MEDICATION SKIN AND NAILS VITAMIN Take as directed      . CALCIUM PO Take 1 tablet by mouth daily.      . Cyanocobalamin (VITAMIN B-12 PO) Take 1 capsule by mouth daily.      [name] esomeprazole (NEXIUM) 40 MG capsule Take 1 capsule (40 mg total) by mouth daily.  30 capsule  1  . fish oil-omega-3 fatty acids 1000 MG capsule Take by mouth daily.      [name] FLUoxetine (PROZAC) 20 MG capsule Take 20 mg by mouth daily. Prescribed by Dr Biagio Borg      . meloxicam (MOBIC) 15 MG tablet Take 7.5 mg (one half tablet) by mouth twice daily  30 tablet  3  . Multiple Vitamins-Minerals (MULTIVITAMIN PO) Take 1 capsule by mouth daily.      . traZODone (DESYREL) 100 MG tablet Take 100 mg by mouth at bedtime. Prescribed Dr Biagio Borg        Allergies as of 08/19/2011  . (No Known Allergies)    reports that she has been smoking Cigarettes.  She has been smoking about .5 packs per day. She has never used smokeless tobacco. She reports that she drinks alcohol. She reports that she does not use illicit drugs.     Review of Systems: Pertinent positive and negative review of systems were noted in the above HPI section. All other review of systems were otherwise negative.  Vital signs were reviewed in today's medical record Physical Exam: General: Well developed , well nourished, no acute distress Head: Normocephalic and atraumatic Eyes:  sclerae anicteric, EOMI Ears: Normal auditory acuity Mouth: No deformity or lesions Neck: Supple, no masses or thyromegaly Lungs: Clear throughout to auscultation Heart: Regular rate and rhythm; no murmurs, rubs or bruits Abdomen: Soft, non tender and non distended. No masses, hepatosplenomegaly or hernias noted. Normal Bowel sounds Rectal:deferred Musculoskeletal: Symmetrical with no gross deformities  Skin: No lesions on visible extremities Pulses:  Normal pulses noted Extremities: No clubbing, cyanosis, edema or deformities noted Neurological: Alert oriented x 4, grossly nonfocal Cervical Nodes:  No significant cervical adenopathy Inguinal Nodes: No significant inguinal adenopathy Psychological:  Alert and cooperative. Normal mood and affect

## 2011-08-19 NOTE — Patient Instructions (Addendum)
Your Endoscopy is scheduled on 08/24/2011 at 11am Your Colonoscopy is scheduled on 09/22/2011 at 8am A sample kit of MoviPrep has been given to you  Upper GI Endoscopy Upper GI endoscopy means using a flexible scope to look at the esophagus, stomach, and upper small bowel. This is done to make a diagnosis in people with heartburn, abdominal pain, or abnormal bleeding. Sometimes an endoscope is needed to remove foreign bodies or food that become stuck in the esophagus; it can also be used to take biopsy samples. For the best results, do not eat or drink for 8 hours before having your upper endoscopy.  To perform the endoscopy, you will probably be sedated and your throat will be numbed with a special spray. The endoscope is then slowly passed down your throat (this will not interfere with your breathing). An endoscopy exam takes 15 to 30 minutes to complete and there is no real pain. Patients rarely remember much about the procedure. The results of the test may take several days if a biopsy or other test is taken.  You may have a sore throat after an endoscopy exam. Serious complications are very rare. Stick to liquids and soft foods until your pain is better. Do not drive a car or operate any dangerous equipment for at least 24 hours after being sedated. SEEK IMMEDIATE MEDICAL CARE IF:   You have severe throat pain.   You have shortness of breath.   You have bleeding problems.   You have a fever.   You have difficulty recovering from your sedation.  Document Released: 04/02/2004 Document Revised: 02/12/2011 Document Reviewed: 02/26/2008 [facility] Patient Information 2012 [place], [place].  Colonoscopy A colonoscopy is an exam to evaluate your entire colon. In this exam, your colon is cleansed. A long fiberoptic tube is inserted through your rectum and into your colon. The fiberoptic scope (endoscope) is a long bundle of enclosed and very flexible fibers. These fibers transmit light to the area  examined and send images from that area to your caregiver. Discomfort is usually minimal. You may be given a drug to help you sleep (sedative) during or prior to the procedure. This exam helps to detect lumps (tumors), polyps, inflammation, and areas of bleeding. Your caregiver may also take a small piece of tissue (biopsy) that will be examined under a microscope. LET YOUR CAREGIVER KNOW ABOUT:   Allergies to food or medicine.   Medicines taken, including vitamins, herbs, eyedrops, over-the-counter medicines, and creams.   Use of steroids (by mouth or creams).   Previous problems with anesthetics or numbing medicines.   History of bleeding problems or blood clots.   Previous surgery.   Other health problems, including diabetes and kidney problems.   Possibility of pregnancy, if this applies.  BEFORE THE PROCEDURE   A clear liquid diet may be required for 2 days before the exam.   Ask your caregiver about changing or stopping your regular medications.   Liquid injections (enemas) or laxatives may be required.   A large amount of electrolyte solution may be given to you to drink over a short period of time. This solution is used to clean out your colon.   You should be present 60 minutes prior to your procedure or as directed by your caregiver.  AFTER THE PROCEDURE   If you received a sedative or pain relieving medication, you will need to arrange for someone to drive you home.   Occasionally, there is a little blood passed with the first  bowel movement. Do not be concerned.  FINDING OUT THE RESULTS OF YOUR TEST Not all test results are available during your visit. If your test results are not back during the visit, make an appointment with your caregiver to find out the results. Do not assume everything is normal if you have not heard from your caregiver or the medical facility. It is important for you to follow up on all of your test results. HOME CARE INSTRUCTIONS   It is not  unusual to pass moderate amounts of gas and experience mild abdominal cramping following the procedure. This is due to air being used to inflate your colon during the exam. Walking or a warm pack on your belly (abdomen) may help.   You may resume all normal meals and activities after sedatives and medicines have worn off.   Only take over-the-counter or prescription medicines for pain, discomfort, or fever as directed by your caregiver. Do not use aspirin or blood thinners if a biopsy was taken. Consult your caregiver for medicine usage if biopsies were taken.  SEEK IMMEDIATE MEDICAL CARE IF:   You have a fever.   You pass large blood clots or fill a toilet with blood following the procedure. This may also occur 10 to 14 days following the procedure. This is more likely if a biopsy was taken.   You develop abdominal pain that keeps getting worse and cannot be relieved with medicine.  Document Released: 02/21/2000 Document Revised: 02/12/2011 Document Reviewed: 10/06/2007 [facility] Patient Information 2012 [place], [place].

## 2011-08-19 NOTE — Assessment & Plan Note (Signed)
Fairly well-controlled with omeprazole. We'll continue with the same.

## 2011-08-19 NOTE — Assessment & Plan Note (Signed)
Plan screening colonoscopy 

## 2011-08-24 ENCOUNTER — Encounter: Payer: Self-pay | Admitting: *Deleted

## 2011-08-24 ENCOUNTER — Ambulatory Visit (AMBULATORY_SURGERY_CENTER): Payer: Self-pay | Admitting: Gastroenterology

## 2011-08-24 ENCOUNTER — Encounter: Payer: Self-pay | Admitting: Gastroenterology

## 2011-08-24 VITALS — BP 120/51 | HR 84 | Temp 98.3°F | Resp 17 | Ht 62.0 in | Wt 169.0 lb

## 2011-08-24 DIAGNOSIS — K219 Gastro-esophageal reflux disease without esophagitis: Secondary | ICD-10-CM

## 2011-08-24 DIAGNOSIS — K222 Esophageal obstruction: Secondary | ICD-10-CM

## 2011-08-24 MED ORDER — SODIUM CHLORIDE 0.9 % IV SOLN: 500.0000 mL | INTRAVENOUS | Status: DC

## 2011-08-24 NOTE — Progress Notes (Signed)
Patient did not experience any of the following events: a burn prior to discharge; a fall within the facility; wrong site/side/patient/procedure/implant event; or a hospital transfer or hospital admission upon discharge from the facility. (G8907) Patient did not have preoperative order for IV antibiotic SSI prophylaxis. (G8918)  

## 2011-08-24 NOTE — Op Note (Signed)
[facility] [address] [place], [place]  [address]  ENDOSCOPY PROCEDURE REPORT  PATIENT:  Tabitha Wilcox, Tabitha Wilcox  MR#:  [id number] BIRTHDATE:  December 17, 1955, 55 yrs. old  GENDER:  female  ENDOSCOPIST:  Barbette Hair. Arlyce Dice, MD Referred by:  PROCEDURE DATE:  08/24/2011 PROCEDURE:  EGD, diagnostic 43235, Maloney Dilation of Esophagus ASA CLASS:  Class II INDICATIONS:  dysphagia  MEDICATIONS:   MAC sedation, administered by CRNA propofol 200mg IV, glycopyrrolate (Robinal) 0.2 mg IV, 0.6cc simethancone 0.6 cc PO TOPICAL ANESTHETIC:  DESCRIPTION OF PROCEDURE:   After the risks and benefits of the procedure were explained, informed consent was obtained.  The LB GIF-H180 [id number] endoscope was introduced through the mouth and advanced to the third portion of the duodenum.  The instrument was slowly withdrawn as the mucosa was fully examined. <<PROCEDUREIMAGES>>  Multiple erosions were found in the distal esophagus (see image7, image6, and image5).  A stricture was found at the gastroesophageal junction (see image4). Early stricture Dilation with maloney dilator 18mm Mild resistance; no heme  Otherwise the examination was normal (see image2 and image3).    Retroflexed views revealed no abnormalities.    The scope was then withdrawn from the patient and the procedure completed.  COMPLICATIONS:  None  ENDOSCOPIC IMPRESSION: 1) Erosions, multiple in the distal esophagus 2) Stricture at the gastroesophageal junction - s/p maloney dilitation 3) Otherwise normal examination RECOMMENDATIONS: 1) avoid NSAIDS 2) continue PPI - nexium 3) Call office next 2-3 days to schedule an office appointment for 1 month  ______________________________ Barbette Hair. Arlyce Dice, MD  CC:  Nydia Bouton MD  n. Rosalie DoctorBarbette Hair. Lety Cullens at 08/24/2011 11:26 AM  Lenord Fellers, [phone]

## 2011-08-24 NOTE — Patient Instructions (Addendum)
Hold meloxicam for at least 2 weeks Discharge instructions given with verbal understanding. Handouts on esophagitis and a dilatation diet given. Resume previous medications. YOU HAD AN ENDOSCOPIC PROCEDURE TODAY AT THE [facility]: Refer to the procedure report that was given to you for any specific questions about what was found during the examination.  If the procedure report does not answer your questions, please call your gastroenterologist to clarify.  If you requested that your care partner not be given the details of your procedure findings, then the procedure report has been included in a sealed envelope for you to review at your convenience later.  YOU SHOULD EXPECT: Some feelings of bloating in the abdomen. Passage of more gas than usual.  Walking can help get rid of the air that was put into your GI tract during the procedure and reduce the bloating. If you had a lower endoscopy (such as a colonoscopy or flexible sigmoidoscopy) you may notice spotting of blood in your stool or on the toilet paper. If you underwent a bowel prep for your procedure, then you may not have a normal bowel movement for a few days.  DIET: Your first meal following the procedure should be a light meal and then it is ok to progress to your normal diet.  A half-sandwich or bowl of soup is an example of a good first meal.  Heavy or fried foods are harder to digest and may make you feel nauseous or bloated.  Likewise meals heavy in dairy and vegetables can cause extra gas to form and this can also increase the bloating.  Drink plenty of fluids but you should avoid alcoholic beverages for 24 hours.  ACTIVITY: Your care partner should take you home directly after the procedure.  You should plan to take it easy, moving slowly for the rest of the day.  You can resume normal activity the day after the procedure however you should NOT DRIVE or use heavy machinery for 24 hours (because of the sedation medicines used  during the test).    SYMPTOMS TO REPORT IMMEDIATELY: A gastroenterologist can be reached at any hour.  During normal business hours, 8:30 AM to 5:00 PM Monday through Friday, call [phone].  After hours and on weekends, please call the GI answering service at [phone] who will take a message and have the physician on call contact you.   Following upper endoscopy (EGD)  Vomiting of blood or coffee ground material  New chest pain or pain under the shoulder blades  Painful or persistently difficult swallowing  New shortness of breath  Fever of 100F or higher  Black, tarry-looking stools  FOLLOW UP: If any biopsies were taken you will be contacted by phone or by letter within the next 1-3 weeks.  Call your gastroenterologist if you have not heard about the biopsies in 3 weeks.  Our staff will call the home number listed on your records the next business day following your procedure to check on you and address any questions or concerns that you may have at that time regarding the information given to you following your procedure. This is a courtesy call and so if there is no answer at the home number and we have not heard from you through the emergency physician on call, we will assume that you have returned to your regular daily activities without incident.  SIGNATURES/CONFIDENTIALITY: You and/or your care partner have signed paperwork which will be entered into your electronic medical record.  These  signatures attest to the fact that that the information above on your After Visit Summary has been reviewed and is understood.  Full responsibility of the confidentiality of this discharge information lies with you and/or your care-partner.

## 2011-08-25 ENCOUNTER — Telehealth: Payer: Self-pay | Admitting: *Deleted

## 2011-08-25 NOTE — Telephone Encounter (Signed)
Pt states she had an EGD yesterday and was told to stop meloxicam for 2 weeks after procedure.  She is approved by GI to take tylenol for pain.  Advised pt to try tylenol as needed for pain, asked her to call back if she has any problems.  Pt agreeable.

## 2011-08-25 NOTE — Telephone Encounter (Signed)
  Follow up Call-  Call back number 08/24/2011  Post procedure Call Back phone  # [phone]  Permission to leave phone message Yes     Patient questions:  Message left to call if necessary.

## 2011-08-25 NOTE — Telephone Encounter (Signed)
Message copied by Mora Bellman on Tue Aug 25, 2011 11:57 AM ------      Message from: Lizbeth Bark      Created: Tue Aug 25, 2011 10:24 AM      Regarding: phone message      Contact: [phone]       Pt would like to discuss her medication changes.

## 2011-09-04 ENCOUNTER — Ambulatory Visit (AMBULATORY_SURGERY_CENTER): Payer: Self-pay | Admitting: Gastroenterology

## 2011-09-04 ENCOUNTER — Encounter: Payer: Self-pay | Admitting: Gastroenterology

## 2011-09-04 VITALS — BP 113/66 | HR 87 | Temp 98.2°F | Resp 27 | Ht 62.0 in | Wt 169.0 lb

## 2011-09-04 DIAGNOSIS — K648 Other hemorrhoids: Secondary | ICD-10-CM

## 2011-09-04 DIAGNOSIS — D126 Benign neoplasm of colon, unspecified: Secondary | ICD-10-CM

## 2011-09-04 DIAGNOSIS — Z1211 Encounter for screening for malignant neoplasm of colon: Secondary | ICD-10-CM

## 2011-09-04 MED ORDER — SODIUM CHLORIDE 0.9 % IV SOLN: 500.0000 mL | INTRAVENOUS | Status: DC

## 2011-09-04 NOTE — Progress Notes (Signed)
Patient did not experience any of the following events: a burn prior to discharge; a fall within the facility; wrong site/side/patient/procedure/implant event; or a hospital transfer or hospital admission upon discharge from the facility. (G8907) Patient did not have preoperative order for IV antibiotic SSI prophylaxis. (G8918)  

## 2011-09-04 NOTE — Op Note (Signed)
[facility] [address] [place], [place]  [address]  COLONOSCOPY PROCEDURE REPORT  PATIENT:  Tabitha Wilcox, Tabitha Wilcox  MR#:  [id number] BIRTHDATE:  01-23-1956, 55 yrs. old  GENDER:  female ENDOSCOPIST:  Tabitha Hair. Arlyce Dice, MD REF. BY: PROCEDURE DATE:  09/04/2011 PROCEDURE:  Colonoscopy with snare polypectomy ASA CLASS:  Class II INDICATIONS:  Routine Risk Screening MEDICATIONS:   MAC sedation, administered by CRNA 200 mg IV  DESCRIPTION OF PROCEDURE:   After the risks benefits and alternatives of the procedure were thoroughly explained, informed consent was obtained.  Digital rectal exam was performed and revealed no abnormalities.   The LB PCF-H180AL [id number] endoscope was introduced through the anus and advanced to the cecum, which was identified by both the appendix and ileocecal valve, without limitations.  The quality of the prep was excellent, using MiraLax.  The instrument was then slowly withdrawn as the colon was fully examined. <<PROCEDUREIMAGES>>  FINDINGS:  A sessile polyp was found in the descending colon. It was 5 mm in size. Polyp was snared without cautery. Retrieval was successful (see image3). snare polyp  Internal Hemorrhoids were found (see image4).  This was otherwise a normal examination of the colon (see image1).   Retroflexed views in the rectum revealed no abnormalities.    The time to cecum =  1) 2.75  minutes. The scope was then withdrawn in  1) 6.0  minutes from the cecum and the procedure completed. COMPLICATIONS:  None ENDOSCOPIC IMPRESSION: 1) 5 mm sessile polyp in the descending colon 2) Internal hemorrhoids 3) Otherwise normal examination RECOMMENDATIONS: 1) If the polyp(s) removed today are proven to be adenomatous (pre-cancerous) polyps, you will need a repeat colonoscopy in 5 years. Otherwise you should continue to follow colorectal cancer screening guidelines for "routine risk" patients with colonoscopy in 10 years. You will receive a  letter within 1-2 weeks with the results of your biopsy as well as final recommendations. Please call my office if you have not received a letter after 3 weeks. REPEAT EXAM:  You will receive a letter from Dr. Arlyce Wilcox in 1-2 weeks, after reviewing the final pathology, with followup recommendations.  ______________________________ Tabitha Hair Arlyce Dice, MD  CC:  Nydia Bouton MD  n. Rosalie DoctorBarbette Hair. Natashia Wilcox at 09/04/2011 11:37 AM  Lenord Fellers, [phone]

## 2011-09-04 NOTE — Patient Instructions (Addendum)
YOU HAD AN ENDOSCOPIC PROCEDURE TODAY AT THE [facility]: Refer to the procedure report that was given to you for any specific questions about what was found during the examination.  If the procedure report does not answer your questions, please call your gastroenterologist to clarify.  If you requested that your care partner not be given the details of your procedure findings, then the procedure report has been included in a sealed envelope for you to review at your convenience later.  YOU SHOULD EXPECT: Some feelings of bloating in the abdomen. Passage of more gas than usual.  Walking can help get rid of the air that was put into your GI tract during the procedure and reduce the bloating. If you had a lower endoscopy (such as a colonoscopy or flexible sigmoidoscopy) you may notice spotting of blood in your stool or on the toilet paper. If you underwent a bowel prep for your procedure, then you may not have a normal bowel movement for a few days.  DIET: Your first meal following the procedure should be a light meal and then it is ok to progress to your normal diet.  A half-sandwich or bowl of soup is an example of a good first meal.  Heavy or fried foods are harder to digest and may make you feel nauseous or bloated.  Likewise meals heavy in dairy and vegetables can cause extra gas to form and this can also increase the bloating.  Drink plenty of fluids but you should avoid alcoholic beverages for 24 hours.  ACTIVITY: Your care partner should take you home directly after the procedure.  You should plan to take it easy, moving slowly for the rest of the day.  You can resume normal activity the day after the procedure however you should NOT DRIVE or use heavy machinery for 24 hours (because of the sedation medicines used during the test).    SYMPTOMS TO REPORT IMMEDIATELY: A gastroenterologist can be reached at any hour.  During normal business hours, 8:30 AM to 5:00 PM Monday through Friday,  call [phone].  After hours and on weekends, please call the GI answering service at [phone] who will take a message and have the physician on call contact you.   Following lower endoscopy (colonoscopy or flexible sigmoidoscopy):  Excessive amounts of blood in the stool  Significant tenderness or worsening of abdominal pains  Swelling of the abdomen that is new, acute  Fever of 100F or higher    FOLLOW UP: If any biopsies were taken you will be contacted by phone or by letter within the next 1-3 weeks.  Call your gastroenterologist if you have not heard about the biopsies in 3 weeks.  Our staff will call the home number listed on your records the next business day following your procedure to check on you and address any questions or concerns that you may have at that time regarding the information given to you following your procedure. This is a courtesy call and so if there is no answer at the home number and we have not heard from you through the emergency physician on call, we will assume that you have returned to your regular daily activities without incident.  SIGNATURES/CONFIDENTIALITY: You and/or your care partner have signed paperwork which will be entered into your electronic medical record.  These signatures attest to the fact that that the information above on your After Visit Summary has been reviewed and is understood.  Full responsibility of the confidentiality   of this discharge information lies with you and/or your care-partner.     

## 2011-09-04 NOTE — Progress Notes (Signed)
The pt tolerated the colonoscopy very well. Maw   

## 2011-09-07 ENCOUNTER — Telehealth: Payer: Self-pay | Admitting: *Deleted

## 2011-09-07 NOTE — Telephone Encounter (Signed)
  Follow up Call-  Call back number 09/04/2011 08/24/2011  Post procedure Call Back phone  # [phone]  Permission to leave phone message Yes Yes     Patient questions:  Do you have a fever, pain , or abdominal swelling? no Pain Score  0 *  Have you tolerated food without any problems? yes  Have you been able to return to your normal activities? yes  Do you have any questions about your discharge instructions: Diet   no Medications  no Follow up visit  no  Do you have questions or concerns about your Care? no  Actions: * If pain score is 4 or above: No action needed, pain <4.

## 2011-09-14 ENCOUNTER — Encounter: Payer: Self-pay | Admitting: Gastroenterology

## 2011-09-22 ENCOUNTER — Encounter: Payer: Self-pay | Admitting: Gastroenterology

## 2011-10-05 ENCOUNTER — Ambulatory Visit: Payer: Self-pay | Admitting: Gastroenterology

## 2011-10-26 ENCOUNTER — Ambulatory Visit (INDEPENDENT_AMBULATORY_CARE_PROVIDER_SITE_OTHER): Payer: Self-pay | Admitting: Family Medicine

## 2011-10-26 ENCOUNTER — Encounter: Payer: Self-pay | Admitting: Family Medicine

## 2011-10-26 VITALS — BP 122/77 | HR 74 | Ht 62.0 in | Wt 169.0 lb

## 2011-10-26 DIAGNOSIS — S83249A Other tear of medial meniscus, current injury, unspecified knee, initial encounter: Secondary | ICD-10-CM

## 2011-10-26 DIAGNOSIS — S46009A Unspecified injury of muscle(s) and tendon(s) of the rotator cuff of unspecified shoulder, initial encounter: Secondary | ICD-10-CM

## 2011-10-26 DIAGNOSIS — S4980XA Other specified injuries of shoulder and upper arm, unspecified arm, initial encounter: Secondary | ICD-10-CM

## 2011-10-26 DIAGNOSIS — IMO0002 Reserved for concepts with insufficient information to code with codable children: Secondary | ICD-10-CM

## 2011-10-26 DIAGNOSIS — M549 Dorsalgia, unspecified: Secondary | ICD-10-CM

## 2011-10-27 NOTE — Progress Notes (Signed)
  Subjective:    Patient ID: Tabitha Wilcox, female    DOB: November 13, 1955, 56 y.o.   MRN: [id number]  HPI #1. Right knee pain. 3-1/2 months ago I gave her corticosteroid injection for knee pain thought to be related to meniscal tear. She did really well with that we'll about 2 weeks ago when her pain returned. It is now 5-6/10. Same symptoms as previously, worse with walking, climbing stairs. She would like a second corticosteroid injection. #2. Right shoulder pain. Long ago she had some issues with rotator cuff but has not bothered her for years. She is currently in a job training program and doing more lifting and work with her arms. She noted this is keeping her up at night. #3. Back pain. Also chronic but worse the last month or so. Possibly she's been on her feet more. No radiation into the legs, no incontinence. Pain is in the center of the back lumbar area diffuse aching in quality. Worse with standing.   Review of Systems Denies unusual weight change. Has had no leg weakness, no leg pains. No fever, sweats, chills.    Objective:   Physical Exam  GENERAL: Well-developed female no acute distress SHOULDER: Right shoulder slight weakness with supraspinatus testing although this may be related to some pain. Positive impingement signs. The rest of rotator cuff is intact although she has pain with internal rotation. KNEES: Neither knee has effusion. The right knee has full range of motion in flexion extension. Medial joint line is tender to palpation. Did not perform McMurray. BACK: Nontender to palpation. No defect. Flexion extension at the hips is normal. She has some mild tenderness to palpation over the PSIS bilaterally. Lower extremity strength is 5 out of 5. DTRs of knee are 5 out of 5. ULTRASOUND: Right shoulder. Articular surface tear that appears to be old in the supraspinatus muscle. This is about one third the surface of the humeral head. There is no increased Doppler activity noted here  the rest the rotator cuff is normal except for some thinning of the attachment of the bursal side of the supraspinatus muscle.  INJECTION: Patient was given informed consent, signed copy in the chart. Appropriate time out was taken. Area prepped and draped in usual sterile fashion. One cc of methylprednisolone 40 mg/ml plus  4 cc of 1% lidocaine without epinephrine was injected into the right subacromial bursa using a(n) posterior approach. The patient tolerated the procedure well. There were no complications. Post procedure instructions were given.       Assessment & Plan:

## 2011-10-27 NOTE — Assessment & Plan Note (Addendum)
Ultrasound showing articular side tear in the supraspinatus, 30%. It is not a full-thickness tear. It looks old and chronic. There is also some thinning of the supraspinatus muscle. Today we'll start her on a rotator cuff program. Did give her corticosteroid injection. I think is tear has been there for a long time and she's compensated until she had new activities recently. Over the corticosteroid injection will allow her to complete a home exercise program. I'll see her back in 4-6 weeks for this.

## 2011-10-27 NOTE — Assessment & Plan Note (Signed)
Functional low back pain. I gave her home exercise program for back. We'll see her back in 4-6 weeks.

## 2011-10-27 NOTE — Assessment & Plan Note (Signed)
Suspected medial meniscal tear given clinical history and ultrasound findings. Her first corticosteroid injection was 3-1/2 months ago. We gave her her second today.

## 2011-11-23 ENCOUNTER — Ambulatory Visit: Payer: Self-pay | Admitting: Family Medicine

## 2011-12-02 ENCOUNTER — Ambulatory Visit (INDEPENDENT_AMBULATORY_CARE_PROVIDER_SITE_OTHER): Payer: Self-pay

## 2011-12-02 DIAGNOSIS — Z23 Encounter for immunization: Secondary | ICD-10-CM

## 2011-12-11 ENCOUNTER — Ambulatory Visit (INDEPENDENT_AMBULATORY_CARE_PROVIDER_SITE_OTHER): Payer: Self-pay | Admitting: Family Medicine

## 2011-12-11 ENCOUNTER — Encounter: Payer: Self-pay | Admitting: Family Medicine

## 2011-12-11 VITALS — BP 125/85 | HR 84 | Ht 62.0 in | Wt 169.0 lb

## 2011-12-11 DIAGNOSIS — S4980XA Other specified injuries of shoulder and upper arm, unspecified arm, initial encounter: Secondary | ICD-10-CM

## 2011-12-11 DIAGNOSIS — M549 Dorsalgia, unspecified: Secondary | ICD-10-CM

## 2011-12-11 DIAGNOSIS — S46009A Unspecified injury of muscle(s) and tendon(s) of the rotator cuff of unspecified shoulder, initial encounter: Secondary | ICD-10-CM

## 2011-12-14 NOTE — Progress Notes (Signed)
  Subjective:    Patient ID: Tabitha Wilcox, female    DOB: 1955/05/28, 56 y.o.   MRN: [id number]  HPI  #1. Followup shoulder pain. She has not really been doing her exercises. At last office visit she had a corticosteroid injection.She notes little improvement in her shoulder pain. She has been taking meloxicam intermittently. #2. Followup back pain. Has had about 10% improvement this from doing the exercises #3. Followup knee pain which was better after the corticosteroid injection.  Review of Systems Denies hand or arm numbness in the right shoulder.    Objective:   Physical Exam Vital signs are reviewed GENERAL: Well-developed female no acute distress SHOULDER: Right. Full range of motion. Pain with supraspinatus testing.       Assessment & Plan:  #1. Chronic partial supraspinatus tear. I do think the exercises are her route to recovery and I have reiterated that. Injection did not seem to help her a great deal. I take meloxicam regularly, really hit the rehabilitation program hard and followup in 3-4 weeks. #2. Chronic back pain. Again I reassured and the need for rehabilitation program.

## 2011-12-29 ENCOUNTER — Other Ambulatory Visit: Payer: Self-pay | Admitting: *Deleted

## 2011-12-29 DIAGNOSIS — S46009A Unspecified injury of muscle(s) and tendon(s) of the rotator cuff of unspecified shoulder, initial encounter: Secondary | ICD-10-CM

## 2012-01-11 ENCOUNTER — Ambulatory Visit: Payer: Medicaid Other | Attending: Family Medicine | Admitting: Rehabilitative and Restorative Service Providers"

## 2012-01-11 DIAGNOSIS — M542 Cervicalgia: Secondary | ICD-10-CM | POA: Insufficient documentation

## 2012-01-11 DIAGNOSIS — IMO0001 Reserved for inherently not codable concepts without codable children: Secondary | ICD-10-CM | POA: Insufficient documentation

## 2012-01-11 DIAGNOSIS — M25519 Pain in unspecified shoulder: Secondary | ICD-10-CM | POA: Insufficient documentation

## 2012-01-18 ENCOUNTER — Ambulatory Visit: Payer: Medicaid Other | Admitting: Rehabilitative and Restorative Service Providers"

## 2012-01-21 ENCOUNTER — Encounter: Payer: Self-pay | Admitting: Rehabilitative and Restorative Service Providers"

## 2012-01-25 ENCOUNTER — Ambulatory Visit: Payer: Medicaid Other | Admitting: Rehabilitative and Restorative Service Providers"

## 2012-01-25 ENCOUNTER — Encounter: Payer: Self-pay | Admitting: Internal Medicine

## 2012-02-02 ENCOUNTER — Encounter: Payer: Self-pay | Admitting: Rehabilitative and Restorative Service Providers"

## 2012-02-08 ENCOUNTER — Ambulatory Visit: Payer: Medicaid Other | Attending: Family Medicine | Admitting: Rehabilitative and Restorative Service Providers"

## 2012-02-08 DIAGNOSIS — IMO0001 Reserved for inherently not codable concepts without codable children: Secondary | ICD-10-CM | POA: Insufficient documentation

## 2012-02-08 DIAGNOSIS — M542 Cervicalgia: Secondary | ICD-10-CM | POA: Insufficient documentation

## 2012-02-08 DIAGNOSIS — M25519 Pain in unspecified shoulder: Secondary | ICD-10-CM | POA: Insufficient documentation

## 2012-02-10 ENCOUNTER — Ambulatory Visit: Payer: Medicaid Other | Admitting: Rehabilitative and Restorative Service Providers"

## 2012-02-16 ENCOUNTER — Encounter: Payer: Self-pay | Admitting: Internal Medicine

## 2012-02-17 ENCOUNTER — Ambulatory Visit: Payer: Medicaid Other | Admitting: Rehabilitative and Restorative Service Providers"

## 2012-02-22 ENCOUNTER — Ambulatory Visit: Payer: Medicaid Other | Admitting: Rehabilitative and Restorative Service Providers"

## 2012-02-24 ENCOUNTER — Other Ambulatory Visit: Payer: Self-pay | Admitting: *Deleted

## 2012-02-24 ENCOUNTER — Ambulatory Visit: Payer: Medicaid Other | Admitting: Rehabilitative and Restorative Service Providers"

## 2012-02-24 MED ORDER — MELOXICAM 15 MG PO TABS: ORAL_TABLET | ORAL | Status: DC

## 2012-03-07 ENCOUNTER — Encounter: Payer: Self-pay | Admitting: Internal Medicine

## 2012-03-07 ENCOUNTER — Ambulatory Visit (INDEPENDENT_AMBULATORY_CARE_PROVIDER_SITE_OTHER): Payer: Self-pay | Admitting: Internal Medicine

## 2012-03-07 VITALS — BP 114/72 | HR 99 | Temp 98.6°F | Ht 62.0 in | Wt 166.5 lb

## 2012-03-07 DIAGNOSIS — S46009A Unspecified injury of muscle(s) and tendon(s) of the rotator cuff of unspecified shoulder, initial encounter: Secondary | ICD-10-CM

## 2012-03-07 DIAGNOSIS — K219 Gastro-esophageal reflux disease without esophagitis: Secondary | ICD-10-CM

## 2012-03-07 DIAGNOSIS — S4980XA Other specified injuries of shoulder and upper arm, unspecified arm, initial encounter: Secondary | ICD-10-CM

## 2012-03-07 DIAGNOSIS — X58XXXA Exposure to other specified factors, initial encounter: Secondary | ICD-10-CM

## 2012-03-07 DIAGNOSIS — M549 Dorsalgia, unspecified: Secondary | ICD-10-CM

## 2012-03-07 MED ORDER — GABAPENTIN 300 MG PO CAPS: 300.0000 mg | ORAL_CAPSULE | Freq: Three times a day (TID) | ORAL | Status: DC

## 2012-03-07 NOTE — Assessment & Plan Note (Addendum)
Patient again is complaining about shoulder pain. I encouraged her to continue physical therapy and Mobic 7.5 mg bid. Started on Gabapentin 300 mg TID. Will increase gradually Will obtain Bmet today.

## 2012-03-07 NOTE — Assessment & Plan Note (Signed)
Encouraged to continue exercise as per Dr Donnetta Hail recommendation.

## 2012-03-07 NOTE — Patient Instructions (Signed)
Cymbalta  Amitriptyline  Buspirone ( smoking)

## 2012-03-07 NOTE — Progress Notes (Signed)
Subjective:   Patient ID: Tabitha Wilcox female   DOB: [birth year] 56 y.o.   MRN: [id number]  HPI: Tabitha Wilcox is a 56 y.o.  Female with PMH signficant as outlined below who presented to the clinic for numbness/pain in right shoulder/neck and back pain .  Patient has been evaluated by Sportsmedicine where she was given steroid injection x2 with no significant improvement. Recommendation was to do physical therapy and Mobic 7.5 mg bid which she has been doing. She noted that she has started to exercise and lost some weight and already feels a little better. She had experienced  knee swelling and ankle swelling in the past but after starting Mobic the symptoms resolved. No other joints involved. No morning stiffness. No rashes.   Tobacco abuse: Ready to quit. Has been in contact with the [facility] and will be receiving patches very soon. She would like to start a medication to help staying smoke free.      Past Medical History  Diagnosis Date  . Asthma   . Tobacco abuse   . Depression   . GERD (gastroesophageal reflux disease)   . Alcoholism   . Anxiety   . Arthritis   . Carpal tunnel syndrome on both sides   . Rotator cuff injury     Right side  . Sciatica   . Ulcer     1990's   Current Outpatient Prescriptions  Medication Sig Dispense Refill  . albuterol (PROVENTIL HFA;VENTOLIN HFA) 108 (90 BASE) MCG/ACT inhaler Inhale 2 puffs into the lungs every 6 (six) hours as needed.  1 Inhaler  2  . AMBULATORY NON FORMULARY MEDICATION SKIN AND NAILS VITAMIN Take as directed      . CALCIUM PO Take 1 tablet by mouth daily.      . Cyanocobalamin (VITAMIN B-12 PO) Take 1 capsule by mouth daily.      [name] esomeprazole (NEXIUM) 40 MG capsule Take 1 capsule (40 mg total) by mouth daily.  30 capsule  1  . fish oil-omega-3 fatty acids 1000 MG capsule Take by mouth daily.      [name] FLUoxetine (PROZAC) 20 MG capsule Take 20 mg by mouth daily. Prescribed by Dr Biagio Borg      . meloxicam (MOBIC) 15  MG tablet Take 7.5 mg (one half tablet) by mouth twice daily  30 tablet  6  . Multiple Vitamins-Minerals (MULTIVITAMIN PO) Take 1 capsule by mouth daily.      . traZODone (DESYREL) 100 MG tablet Take 100 mg by mouth at bedtime. Prescribed Dr Biagio Borg       Current Facility-Administered Medications  Medication Dose Route Frequency Provider Last Rate Last Dose  . 0.9 %  sodium chloride infusion  500 mL Intravenous Continuous Louis Meckel, MD       Family History  Problem Relation Age of Onset  . Colon cancer Maternal Aunt   . Colon cancer Paternal Uncle    History   Social History  . Marital Status: Divorced    Spouse Name: N/A    Number of Children: 3  . Years of Education: N/A   Occupational History  . Unemployed     Social History Main Topics  . Smoking status: Current Every Day Smoker -- 0.5 packs/day    Types: Cigarettes  . Smokeless tobacco: Never Used     Comment: Planning on quitting soon.  . Alcohol Use: 7.2 oz/week    12 Cans of beer per week  Comment: Drinks beer occasionally (beer once or twice a week)  . Drug Use: No     Comment: stopped since 2 1/2 years. Before using Marijuana.   . Sexually Active: None   Other Topics Concern  . 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 Daughter, 35Son, 33Son, 50 (Deceased, shot at fathers home in 2007)Does not exercise anymore due to foot/sciatica pain   Review of Systems: Constitutional: Denies fever, chills, diaphoresis, appetite change and fatigue.   Respiratory: Denies SOB, DOE, cough, chest tightness,  and wheezing.   Cardiovascular: Denies chest pain, palpitations and leg swelling.  Gastrointestinal: Denies nausea, vomiting, abdominal pain, diarrhea, constipation,Musculoskeletal: Noted myalgias, back pain, joint swelling,  arthralgias  Neurological: Denies dizziness   Objective:  Physical Exam: Filed Vitals:   03/07/12 1508  BP: 114/72  Pulse: 99  Temp: 98.6 F (37 C)  TempSrc: Oral  Height: 5\' 2"  (1.575 m)  Weight: 166 lb 8 oz (75.524 kg)  SpO2: 96%   Constitutional: Vital signs reviewed.  Patient is a well-developed and well-nourished female in no acute distress and cooperative with exam. Alert and oriented x3.  Neck: Supple,  Cardiovascular: RRR, S1 normal, S2 normal, no MRG, pulses symmetric and intact bilaterally Pulmonary/Chest: CTAB, no wheezes, rales, or rhonchi Abdominal: Soft. Non-tender, non-distended, bowel sounds are normal, Musculoskeletal: Right shoulder: tenderness to palpation anteriorly. No redness noted. Mild decrease in ROM due to pain. Good strength .  Lumbar area: pain on palpation.  Neurological: A&O x3, Strength is normal and symmetric bilaterally, sensory intact to light touch bilaterally.

## 2012-03-08 LAB — BASIC METABOLIC PANEL WITH GFR
BUN: 17 mg/dL (ref 6–23)
Creat: 0.68 mg/dL (ref 0.50–1.10)
GFR, Est African American: >89 mL/min
GFR, Est Non African American: >89 mL/min
Potassium: 4.4 mEq/L (ref 3.5–5.3)

## 2012-03-10 ENCOUNTER — Encounter: Payer: Self-pay | Admitting: Physical Therapy

## 2012-03-15 ENCOUNTER — Ambulatory Visit: Payer: Medicaid Other | Attending: Family Medicine | Admitting: Physical Therapy

## 2012-03-15 DIAGNOSIS — M542 Cervicalgia: Secondary | ICD-10-CM | POA: Insufficient documentation

## 2012-03-15 DIAGNOSIS — IMO0001 Reserved for inherently not codable concepts without codable children: Secondary | ICD-10-CM | POA: Insufficient documentation

## 2012-03-15 DIAGNOSIS — M25519 Pain in unspecified shoulder: Secondary | ICD-10-CM | POA: Insufficient documentation

## 2012-03-28 ENCOUNTER — Ambulatory Visit: Payer: Medicaid Other | Admitting: Rehabilitative and Restorative Service Providers"

## 2012-04-04 ENCOUNTER — Ambulatory Visit: Payer: Medicaid Other | Admitting: Rehabilitative and Restorative Service Providers"

## 2012-04-06 ENCOUNTER — Ambulatory Visit: Payer: Medicaid Other | Attending: Family Medicine | Admitting: Physical Therapy

## 2012-04-18 ENCOUNTER — Encounter: Payer: Self-pay | Admitting: Internal Medicine

## 2012-04-18 ENCOUNTER — Ambulatory Visit (INDEPENDENT_AMBULATORY_CARE_PROVIDER_SITE_OTHER): Payer: Medicaid Other | Admitting: Internal Medicine

## 2012-04-18 VITALS — BP 125/76 | HR 91 | Temp 98.3°F | Ht 62.0 in | Wt 169.1 lb

## 2012-04-18 DIAGNOSIS — S46009A Unspecified injury of muscle(s) and tendon(s) of the rotator cuff of unspecified shoulder, initial encounter: Secondary | ICD-10-CM

## 2012-04-18 DIAGNOSIS — M25519 Pain in unspecified shoulder: Secondary | ICD-10-CM

## 2012-04-18 DIAGNOSIS — F17200 Nicotine dependence, unspecified, uncomplicated: Secondary | ICD-10-CM

## 2012-04-18 DIAGNOSIS — Z720 Tobacco use: Secondary | ICD-10-CM

## 2012-04-18 MED ORDER — BACLOFEN 10 MG PO TABS: 10.0000 mg | ORAL_TABLET | Freq: Two times a day (BID) | ORAL | Status: DC

## 2012-04-18 NOTE — Assessment & Plan Note (Addendum)
Current steroid injections not helping and will refer to orthopedics for possible repair. Gave baclofen rx at today's visit.

## 2012-04-18 NOTE — Assessment & Plan Note (Signed)
Trying to quit with patch and buproprion.

## 2012-04-18 NOTE — Patient Instructions (Signed)
Come back in March for a PAP smear and go see the orthopedic doctor to look at your neck and shoulders. We will give you a pain medicine called baclofen to try for pain. You can take it up to 2 times daily. Our number is [phone].

## 2012-04-18 NOTE — Progress Notes (Signed)
Subjective:     Patient ID: Tabitha Wilcox, female   DOB: 24-Oct-1955, 57 y.o.   MRN: [id number]  HPI The patient is a 57 year old female with past medical history of depression, back pain, carpal tunnel, asthma, tobacco abuse. She is trying to quit currently with the patch and buproprion. She states her asthma is under fairly good control and her carpal tunnel syndrome is reasonably well-controlled. She is having problems however with the back pain which is also in her shoulders. She states she has gone to sports medicine to have injections in her shoulders which did not work. She has tried Aleve which was bad and on her stomach and she tried gabapentin which was also bad on her stomach. She does take Nexium daily however this was not enough to overcome those medications and she had to stop taking them. She is not having any shortness of breath, chest pain, nausea, vomiting, diarrhea. She states that she has had Pap smear before however it was many years ago. She did not have hysterectomy however she is postmenopausal at this time. She does not wish to get Pap smear at today's visit however would be reasonable to get one in March or April.  Review of Systems  Constitutional: Positive for activity change. Negative for fever, chills, diaphoresis, appetite change, fatigue and unexpected weight change.  HENT: Positive for neck pain and neck stiffness. Negative for hearing loss, ear pain, nosebleeds, congestion, sore throat, facial swelling, rhinorrhea, sneezing, drooling, mouth sores, trouble swallowing, dental problem, voice change, postnasal drip, sinus pressure, tinnitus and ear discharge.   Eyes: Negative.   Respiratory: Negative for cough, choking, shortness of breath and wheezing.   Cardiovascular: Negative for chest pain, palpitations and leg swelling.  Gastrointestinal: Negative.   Endocrine: Negative.   Genitourinary: Negative.   Musculoskeletal: Positive for myalgias, back pain and arthralgias.  Negative for joint swelling and gait problem.  Skin: Negative.   Allergic/Immunologic: Negative.   Neurological: Negative.  Negative for dizziness, tremors, seizures, syncope, weakness and numbness.  Psychiatric/Behavioral: Negative.        Objective:   Physical Exam  Constitutional: She is oriented to person, place, and time. She appears well-developed and well-nourished. No distress.  HENT:  Head: Normocephalic and atraumatic.  Eyes: EOM are normal. Pupils are equal, round, and reactive to light.  Neck: Normal range of motion. Neck supple. No JVD present. No tracheal deviation present. No thyromegaly present.  Cardiovascular: Normal rate, regular rhythm and normal heart sounds.   Pulmonary/Chest: Effort normal and breath sounds normal. No respiratory distress. She has no wheezes. She has no rales.  Abdominal: Soft. Bowel sounds are normal.  Musculoskeletal: Normal range of motion. She exhibits tenderness. She exhibits no edema.  Mostly in the shoulder region and upper neck pain C2-C4 region  Lymphadenopathy:    She has no cervical adenopathy.  Neurological: She is alert and oriented to person, place, and time. No cranial nerve deficit.  Skin: Skin is warm and dry. No rash noted. No erythema. No pallor.  Psychiatric: She has a normal mood and affect. Her behavior is normal.       Assessment/Plan:    1. Please see problem oriented charting.  2. Disposition-the patient be seen back in 3 months. Prescription for baclofen was given at today's visit. Referral for orthopedic surgery was given at today's visit regarding shoulder pain and neck pain. Patient advised to get Pap smear at next visit. She is currently trying to quit smoking and I congratulated  her in that regard.

## 2012-04-25 ENCOUNTER — Ambulatory Visit (INDEPENDENT_AMBULATORY_CARE_PROVIDER_SITE_OTHER): Payer: Medicaid Other | Admitting: Family Medicine

## 2012-04-25 ENCOUNTER — Other Ambulatory Visit: Payer: Self-pay | Admitting: Internal Medicine

## 2012-04-25 VITALS — BP 110/70 | Ht 62.0 in | Wt 169.0 lb

## 2012-04-25 DIAGNOSIS — S46009A Unspecified injury of muscle(s) and tendon(s) of the rotator cuff of unspecified shoulder, initial encounter: Secondary | ICD-10-CM

## 2012-04-25 DIAGNOSIS — Z1231 Encounter for screening mammogram for malignant neoplasm of breast: Secondary | ICD-10-CM

## 2012-04-25 DIAGNOSIS — S4980XA Other specified injuries of shoulder and upper arm, unspecified arm, initial encounter: Secondary | ICD-10-CM

## 2012-04-25 NOTE — Progress Notes (Signed)
Patient left without being seen.

## 2012-05-16 ENCOUNTER — Encounter: Payer: Medicaid Other | Admitting: Internal Medicine

## 2012-05-25 ENCOUNTER — Ambulatory Visit (HOSPITAL_COMMUNITY)
Admission: RE | Admit: 2012-05-25 | Discharge: 2012-05-25 | Disposition: A | Discharge status: Home / Self Care | Payer: Medicaid Other | Source: Ambulatory Visit | Attending: Internal Medicine | Admitting: Internal Medicine

## 2012-05-25 DIAGNOSIS — Z1231 Encounter for screening mammogram for malignant neoplasm of breast: Secondary | ICD-10-CM | POA: Insufficient documentation

## 2012-05-31 ENCOUNTER — Ambulatory Visit (INDEPENDENT_AMBULATORY_CARE_PROVIDER_SITE_OTHER): Payer: Medicaid Other | Admitting: Internal Medicine

## 2012-05-31 ENCOUNTER — Encounter: Payer: Self-pay | Admitting: Internal Medicine

## 2012-05-31 ENCOUNTER — Other Ambulatory Visit (HOSPITAL_COMMUNITY)
Admission: RE | Admit: 2012-05-31 | Discharge: 2012-05-31 | Disposition: A | Discharge status: Home / Self Care | Payer: Medicaid Other | Source: Ambulatory Visit | Attending: Internal Medicine | Admitting: Internal Medicine

## 2012-05-31 VITALS — BP 127/77 | HR 99 | Temp 98.5°F | Ht 62.0 in | Wt 165.6 lb

## 2012-05-31 DIAGNOSIS — F17200 Nicotine dependence, unspecified, uncomplicated: Secondary | ICD-10-CM

## 2012-05-31 DIAGNOSIS — Z720 Tobacco use: Secondary | ICD-10-CM

## 2012-05-31 DIAGNOSIS — M549 Dorsalgia, unspecified: Secondary | ICD-10-CM

## 2012-05-31 DIAGNOSIS — O09A0 Supervision of pregnancy with history of molar pregnancy, unspecified trimester: Secondary | ICD-10-CM | POA: Insufficient documentation

## 2012-05-31 DIAGNOSIS — M5431 Sciatica, right side: Secondary | ICD-10-CM

## 2012-05-31 DIAGNOSIS — Z1151 Encounter for screening for human papillomavirus (HPV): Secondary | ICD-10-CM | POA: Insufficient documentation

## 2012-05-31 DIAGNOSIS — Z124 Encounter for screening for malignant neoplasm of cervix: Secondary | ICD-10-CM

## 2012-05-31 DIAGNOSIS — Z01419 Encounter for gynecological examination (general) (routine) without abnormal findings: Secondary | ICD-10-CM | POA: Insufficient documentation

## 2012-05-31 DIAGNOSIS — S46002S Unspecified injury of muscle(s) and tendon(s) of the rotator cuff of left shoulder, sequela: Secondary | ICD-10-CM

## 2012-05-31 MED ORDER — ACETAMINOPHEN 325 MG PO TABS: 325.0000 mg | ORAL_TABLET | Freq: Four times a day (QID) | ORAL | Status: DC | PRN

## 2012-05-31 MED ORDER — GABAPENTIN 300 MG PO CAPS: 300.0000 mg | ORAL_CAPSULE | Freq: Three times a day (TID) | ORAL | Status: DC

## 2012-05-31 MED ORDER — TRAMADOL HCL 50 MG PO TABS: 50.0000 mg | ORAL_TABLET | Freq: Two times a day (BID) | ORAL | Status: DC | PRN

## 2012-05-31 NOTE — Progress Notes (Signed)
Subjective:   Patient ID: Tabitha Wilcox female   DOB: 12-26-1955 57 y.o.   MRN: [id number]  HPI: [name] is a 57 y.o. female with cosmetic history significant as outlined below who presented to the clinic for regular office visit. Her main concern today is back pain. She has been experience back pain for regular long time. It is located in the lower back. She reports a shooting pain down her leg especially on the right. Denies any weakness, numbness or tingling, no changes in bowel or bladder control, no fevers or chills, no rashes. She would like to have a lidocaine patch prescribed for the pain. Patient reports she had tried gabapentin and Mobic which made her stomach upset. She further endorses left shoulder pain which she is followed by orthopedic surgeon who recommended surgical intervention.    Past Medical History  Diagnosis Date  . Asthma   . Tobacco abuse   . Depression   . GERD (gastroesophageal reflux disease)   . Alcoholism   . Anxiety   . Arthritis   . Carpal tunnel syndrome on both sides   . Rotator cuff injury     Right side  . Sciatica   . Ulcer     1990's   Current Outpatient Prescriptions  Medication Sig Dispense Refill  . albuterol (PROVENTIL HFA;VENTOLIN HFA) 108 (90 BASE) MCG/ACT inhaler Inhale 2 puffs into the lungs every 6 (six) hours as needed.  1 Inhaler  2  . AMBULATORY NON FORMULARY MEDICATION SKIN AND NAILS VITAMIN Take as directed      . baclofen (LIORESAL) 10 MG tablet Take 1 tablet (10 mg total) by mouth 2 (two) times daily.  60 tablet  1  . buPROPion (WELLBUTRIN XL) 150 MG 24 hr tablet Take 150 mg by mouth daily.      [name] CALCIUM PO Take 1 tablet by mouth daily.      . Cyanocobalamin (VITAMIN B-12 PO) Take 1 capsule by mouth daily.      [name] esomeprazole (NEXIUM) 40 MG capsule Take 1 capsule (40 mg total) by mouth daily.  30 capsule  1  . fish oil-omega-3 fatty acids 1000 MG capsule Take by mouth daily.      [name] FLUoxetine (PROZAC) 20 MG capsule  Take 20 mg by mouth daily. Prescribed by Dr Biagio Borg      . gabapentin (NEURONTIN) 300 MG capsule Take 1 capsule (300 mg total) by mouth 3 (three) times daily.  90 capsule  0  . hydrOXYzine (ATARAX/VISTARIL) 25 MG tablet Take 25 mg by mouth 2 (two) times daily as needed. Prescribed by Dr Marybelle Killings      . meloxicam (MOBIC) 15 MG tablet Take 7.5 mg (one half tablet) by mouth twice daily  30 tablet  6  . Multiple Vitamins-Minerals (MULTIVITAMIN PO) Take 1 capsule by mouth daily.      . traZODone (DESYREL) 100 MG tablet Take 100 mg by mouth at bedtime. Prescribed Dr Biagio Borg       No current facility-administered medications for this visit.   Family History  Problem Relation Age of Onset  . Colon cancer Maternal Aunt   . Colon cancer Paternal Uncle    History   Social History  . Marital Status: Divorced    Spouse Name: N/A    Number of Children: 3  . Years of Education: N/A   Occupational History  . Unemployed     Social History Main Topics  . Smoking status: Current Every  Day Smoker -- 0.20 packs/day    Types: Cigarettes  . Smokeless tobacco: Never Used     Comment: cuting back. Taking Wellbutrin.  . Alcohol Use: 7.2 oz/week    12 Cans of beer per week     Comment: Drinks beer occasionally (beer once or twice a week)  . Drug Use: No     Comment: stopped since 2 1/2 years. Before using Marijuana.   . Sexually Active: None   Other Topics Concern  . 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       Daughter, 72   Son, 72   Son, 45 (Deceased, shot at fathers home in 2007)   Does not exercise anymore due to foot/sciatica pain         Review of Systems: Constitutional: Denies fever, chills, diaphoresis, appetite change and fatigue.  Respiratory: Denies SOB, DOE, cough, chest tightness,  and  wheezing.   Cardiovascular: Denies chest pain, palpitations and leg swelling.  Gastrointestinal: Denies nausea, vomiting, abdominal pain, diarrhea, constipation, blood in stool and abdominal distention.  Genitourinary: Denies dysuria, urgency, frequency, hematuria, flank pain and difficulty urinating.  Skin: Denies pallor, rash and wound.  Neurological: Denies dizziness,weakness, light-headedness, numbness and headaches.     Objective:  Physical Exam: Filed Vitals:   05/31/12 1021  BP: 127/77  Pulse: 99  Temp: 98.5 F (36.9 C)  TempSrc: Oral  Height: 5\' 2"  (1.575 m)  Weight: 165 lb 9.6 oz (75.116 kg)  SpO2: 97%   Constitutional: Vital signs reviewed.  Patient is a well-developed and well-nourished female  in no acute distress and cooperative with exam. Alert and oriented x3.  Neck: Supple,  Cardiovascular: RRR, S1 normal, S2 normal, no MRG, pulses symmetric and intact bilaterally Pulmonary/Chest: CTAB, no wheezes, rales, or rhonchi Abdominal: Soft. Non-tender, non-distended, bowel sounds are normal,  GU: no CVA tenderness Musculoskeletal: No joint deformities, erythema, or stiffness, ROM full and no nontender. Mild tenderness present in the lumbar paraspinal area on palpation. Negative straight leg test.  Hematology: no cervical adenopathy.  Neurological: A&O x3, Strength is normal and symmetric bilaterally, cranial nerve II-XII are grossly intact, no focal motor deficit, sensory intact to light touch bilaterally.  Skin: Warm, dry and intact. No rash, cyanosis, or clubbing.

## 2012-06-01 NOTE — Assessment & Plan Note (Signed)
Patient has chronic back pain for years. His CT was done in 2010 with did not show any significant changes. I refer patient for physical therapy and start patient on tramadol for pain control. The tramadol should be short-term. Furthermore we'll obtain a lumbar x-ray.

## 2012-06-01 NOTE — Assessment & Plan Note (Signed)
Pap smear obtained today. 

## 2012-06-01 NOTE — Assessment & Plan Note (Signed)
Patient continues to smoke but reports that she has cut down. Patient was provided counseling him and handouts for one 800-number. Patient is currently on Wellbutrin and reports that does help with the craving somewhat.

## 2012-06-02 ENCOUNTER — Other Ambulatory Visit (HOSPITAL_COMMUNITY): Payer: Medicaid Other

## 2012-06-13 ENCOUNTER — Ambulatory Visit: Payer: Medicaid Other | Admitting: Physical Therapy

## 2012-06-14 ENCOUNTER — Ambulatory Visit: Payer: Medicaid Other | Admitting: Physical Therapy

## 2012-06-21 ENCOUNTER — Ambulatory Visit: Payer: Medicaid Other | Attending: Internal Medicine

## 2012-06-21 DIAGNOSIS — IMO0001 Reserved for inherently not codable concepts without codable children: Secondary | ICD-10-CM | POA: Insufficient documentation

## 2012-06-21 DIAGNOSIS — M542 Cervicalgia: Secondary | ICD-10-CM | POA: Insufficient documentation

## 2012-06-21 DIAGNOSIS — M25519 Pain in unspecified shoulder: Secondary | ICD-10-CM | POA: Insufficient documentation

## 2012-06-23 ENCOUNTER — Telehealth: Payer: Self-pay | Admitting: Dietician

## 2012-06-23 NOTE — Telephone Encounter (Signed)
Checked blood sugar and it was in 50s and 60s fasting. Patient wanted to know if she should be concerned and discuss with doctor at her next appointment. She denies having diabetes. I explained that in someone without diabetes having blood sugars this low is pretty normal when fasting ( hoem meters are not 100% accurate)  and it usually means it is time to eat. Explained to normal daily fluctuations in  Blood sugar and rational for healthy meal pattern eating every 40 6 hours while awake.

## 2012-06-25 ENCOUNTER — Encounter (HOSPITAL_COMMUNITY): Payer: Self-pay | Admitting: Nurse Practitioner

## 2012-06-25 ENCOUNTER — Emergency Department (HOSPITAL_COMMUNITY)
Admission: EM | Admit: 2012-06-25 | Discharge: 2012-06-25 | Discharge status: Against Medical Advice | Payer: Medicaid Other | Attending: Emergency Medicine | Admitting: Emergency Medicine

## 2012-06-25 DIAGNOSIS — Y939 Activity, unspecified: Secondary | ICD-10-CM | POA: Insufficient documentation

## 2012-06-25 DIAGNOSIS — S4980XA Other specified injuries of shoulder and upper arm, unspecified arm, initial encounter: Secondary | ICD-10-CM | POA: Insufficient documentation

## 2012-06-25 DIAGNOSIS — Y9229 Other specified public building as the place of occurrence of the external cause: Secondary | ICD-10-CM | POA: Insufficient documentation

## 2012-06-25 DIAGNOSIS — W010XXA Fall on same level from slipping, tripping and stumbling without subsequent striking against object, initial encounter: Secondary | ICD-10-CM | POA: Insufficient documentation

## 2012-06-25 DIAGNOSIS — S46909A Unspecified injury of unspecified muscle, fascia and tendon at shoulder and upper arm level, unspecified arm, initial encounter: Secondary | ICD-10-CM | POA: Insufficient documentation

## 2012-06-25 NOTE — ED Notes (Signed)
Unable to locate the pt 

## 2012-06-25 NOTE — ED Notes (Signed)
The pt left a few minutes after checking in.  Another pt heard her say she was leaving and wal;ked out the back door.  She did not get the iv ordered

## 2012-06-25 NOTE — ED Notes (Signed)
Unable to locate pt x2

## 2012-06-25 NOTE — ED Notes (Signed)
Per ems: pt fell on slippery floor at grocery store today and reports having L shoulder pain since. Cms intact, no deformity.

## 2012-06-28 ENCOUNTER — Ambulatory Visit: Payer: Medicaid Other

## 2012-07-05 ENCOUNTER — Encounter (HOSPITAL_COMMUNITY): Payer: Self-pay | Admitting: Emergency Medicine

## 2012-07-05 ENCOUNTER — Encounter (HOSPITAL_COMMUNITY): Payer: Self-pay | Admitting: Anesthesiology

## 2012-07-05 ENCOUNTER — Encounter (HOSPITAL_COMMUNITY): Admission: EM | Disposition: A | Discharge status: Home / Self Care | Payer: Self-pay | Source: Home / Self Care

## 2012-07-05 ENCOUNTER — Inpatient Hospital Stay (HOSPITAL_COMMUNITY)
Admission: EM | Admit: 2012-07-05 | Discharge: 2012-07-07 | DRG: 603 | Disposition: A | Discharge status: Home / Self Care | Payer: Medicaid Other | Attending: Surgery | Admitting: Surgery

## 2012-07-05 ENCOUNTER — Inpatient Hospital Stay (HOSPITAL_COMMUNITY): Payer: Medicaid Other | Admitting: Anesthesiology

## 2012-07-05 DIAGNOSIS — L02419 Cutaneous abscess of limb, unspecified: Principal | ICD-10-CM | POA: Diagnosis present

## 2012-07-05 DIAGNOSIS — F3289 Other specified depressive episodes: Secondary | ICD-10-CM | POA: Diagnosis present

## 2012-07-05 DIAGNOSIS — Z23 Encounter for immunization: Secondary | ICD-10-CM

## 2012-07-05 DIAGNOSIS — L02215 Cutaneous abscess of perineum: Secondary | ICD-10-CM

## 2012-07-05 DIAGNOSIS — F17200 Nicotine dependence, unspecified, uncomplicated: Secondary | ICD-10-CM | POA: Diagnosis present

## 2012-07-05 DIAGNOSIS — K219 Gastro-esophageal reflux disease without esophagitis: Secondary | ICD-10-CM | POA: Diagnosis present

## 2012-07-05 DIAGNOSIS — F329 Major depressive disorder, single episode, unspecified: Secondary | ICD-10-CM | POA: Diagnosis present

## 2012-07-05 DIAGNOSIS — L03119 Cellulitis of unspecified part of limb: Secondary | ICD-10-CM

## 2012-07-05 DIAGNOSIS — Z79899 Other long term (current) drug therapy: Secondary | ICD-10-CM

## 2012-07-05 DIAGNOSIS — J45909 Unspecified asthma, uncomplicated: Secondary | ICD-10-CM | POA: Diagnosis present

## 2012-07-05 HISTORY — DX: Shortness of breath: R06.02

## 2012-07-05 HISTORY — PX: IRRIGATION AND DEBRIDEMENT ABSCESS: SHX5252

## 2012-07-05 LAB — POCT I-STAT, CHEM 8
Calcium, Ion: 1.18 mmol/L (ref 1.12–1.23)
Creatinine, Ser: 0.7 mg/dL (ref 0.50–1.10)
Glucose, Bld: 114 mg/dL — ABNORMAL HIGH (ref 70–99)
Hemoglobin: 13.3 g/dL (ref 12.0–15.0)
TCO2: 21 mmol/L (ref 0–100)

## 2012-07-05 LAB — CBC WITH DIFFERENTIAL/PLATELET
Eosinophils Absolute: 0 10*3/uL (ref 0.0–0.7)
Eosinophils Relative: 0 % (ref 0–5)
HCT: 34.3 % — ABNORMAL LOW (ref 36.0–46.0)
Lymphs Abs: 1.2 10*3/uL (ref 0.7–4.0)
MCH: 30.9 pg (ref 26.0–34.0)
MCV: 87.5 fL (ref 78.0–100.0)
Monocytes Absolute: 0.9 10*3/uL (ref 0.1–1.0)
Monocytes Relative: 5 % (ref 3–12)
Platelets: 265 10*3/uL (ref 150–400)
RBC: 3.92 MIL/uL (ref 3.87–5.11)

## 2012-07-05 SURGERY — IRRIGATION AND DEBRIDEMENT ABSCESS
Anesthesia: General | Site: Perineum | Laterality: Right | Wound class: Dirty or Infected

## 2012-07-05 MED ORDER — 0.9 % SODIUM CHLORIDE (POUR BTL) OPTIME
TOPICAL | Status: DC | PRN
  Administered 2012-07-05: 1000 mL

## 2012-07-05 MED ORDER — BUPIVACAINE-EPINEPHRINE 0.5% -1:200000 IJ SOLN
INTRAMUSCULAR | Status: DC | PRN
  Administered 2012-07-05: 1 mL

## 2012-07-05 MED ORDER — PNEUMOCOCCAL VAC POLYVALENT 25 MCG/0.5ML IJ INJ
0.5000 mL | INJECTION | INTRAMUSCULAR | Status: AC
  Administered 2012-07-06: 0.5 mL via INTRAMUSCULAR
  Filled 2012-07-05: qty 0.5

## 2012-07-05 MED ORDER — CLINDAMYCIN PHOSPHATE 900 MG/50ML IV SOLN
900.0000 mg | Freq: Once | INTRAVENOUS | Status: AC
  Administered 2012-07-05: 900 mg via INTRAVENOUS
  Filled 2012-07-05: qty 50

## 2012-07-05 MED ORDER — HYDROMORPHONE HCL PF 1 MG/ML IJ SOLN
INTRAMUSCULAR | Status: AC
  Filled 2012-07-05: qty 1

## 2012-07-05 MED ORDER — PROPOFOL 10 MG/ML IV BOLUS
INTRAVENOUS | Status: DC | PRN
  Administered 2012-07-05: 200 mg via INTRAVENOUS

## 2012-07-05 MED ORDER — ONDANSETRON HCL 4 MG/2ML IJ SOLN: 4.0000 mg | Freq: Once | INTRAMUSCULAR | Status: DC | PRN

## 2012-07-05 MED ORDER — HYDROMORPHONE HCL PF 1 MG/ML IJ SOLN
1.0000 mg | INTRAMUSCULAR | Status: DC | PRN
  Administered 2012-07-05: 1 mg via INTRAVENOUS
  Filled 2012-07-05: qty 1

## 2012-07-05 MED ORDER — DIPHENHYDRAMINE HCL 12.5 MG/5ML PO ELIX: 12.5000 mg | ORAL_SOLUTION | Freq: Four times a day (QID) | ORAL | Status: DC | PRN

## 2012-07-05 MED ORDER — PIPERACILLIN-TAZOBACTAM 3.375 G IVPB
3.3750 g | Freq: Three times a day (TID) | INTRAVENOUS | Status: DC
  Administered 2012-07-05 – 2012-07-07 (×6): 3.375 g via INTRAVENOUS
  Filled 2012-07-05 (×8): qty 50

## 2012-07-05 MED ORDER — SODIUM CHLORIDE 0.9 % IV SOLN
INTRAVENOUS | Status: DC
  Administered 2012-07-05 – 2012-07-07 (×4): via INTRAVENOUS

## 2012-07-05 MED ORDER — MORPHINE SULFATE 2 MG/ML IJ SOLN
2.0000 mg | Freq: Once | INTRAMUSCULAR | Status: AC
  Administered 2012-07-05: 2 mg via INTRAVENOUS
  Filled 2012-07-05: qty 1

## 2012-07-05 MED ORDER — BUPROPION HCL ER (XL) 150 MG PO TB24
150.0000 mg | ORAL_TABLET | Freq: Every day | ORAL | Status: DC
  Administered 2012-07-05 – 2012-07-07 (×3): 150 mg via ORAL
  Filled 2012-07-05 (×4): qty 1

## 2012-07-05 MED ORDER — BUPIVACAINE HCL (PF) 0.5 % IJ SOLN
INTRAMUSCULAR | Status: AC
  Filled 2012-07-05: qty 30

## 2012-07-05 MED ORDER — OXYCODONE-ACETAMINOPHEN 5-325 MG PO TABS
1.0000 | ORAL_TABLET | ORAL | Status: DC | PRN
  Administered 2012-07-05 – 2012-07-07 (×9): 2 via ORAL
  Filled 2012-07-05 (×9): qty 2

## 2012-07-05 MED ORDER — ONDANSETRON HCL 4 MG/2ML IJ SOLN: 4.0000 mg | Freq: Four times a day (QID) | INTRAMUSCULAR | Status: DC | PRN

## 2012-07-05 MED ORDER — LIDOCAINE HCL (CARDIAC) 20 MG/ML IV SOLN
INTRAVENOUS | Status: DC | PRN
  Administered 2012-07-05: 50 mg via INTRAVENOUS

## 2012-07-05 MED ORDER — MORPHINE SULFATE 4 MG/ML IJ SOLN: 4.0000 mg | INTRAMUSCULAR | Status: DC | PRN

## 2012-07-05 MED ORDER — ONDANSETRON HCL 4 MG/2ML IJ SOLN
INTRAMUSCULAR | Status: DC | PRN
  Administered 2012-07-05: 4 mg via INTRAVENOUS

## 2012-07-05 MED ORDER — LACTATED RINGERS IV SOLN
INTRAVENOUS | Status: DC | PRN
  Administered 2012-07-05 (×3): via INTRAVENOUS

## 2012-07-05 MED ORDER — DIPHENHYDRAMINE HCL 50 MG/ML IJ SOLN: 12.5000 mg | Freq: Four times a day (QID) | INTRAMUSCULAR | Status: DC | PRN

## 2012-07-05 MED ORDER — ALBUTEROL SULFATE HFA 108 (90 BASE) MCG/ACT IN AERS: 2.0000 | INHALATION_SPRAY | Freq: Four times a day (QID) | RESPIRATORY_TRACT | Status: DC | PRN

## 2012-07-05 MED ORDER — SODIUM CHLORIDE 0.9 % IV BOLUS (SEPSIS)
2000.0000 mL | Freq: Once | INTRAVENOUS | Status: AC
  Administered 2012-07-05: 2000 mL via INTRAVENOUS

## 2012-07-05 MED ORDER — PANTOPRAZOLE SODIUM 40 MG IV SOLR
40.0000 mg | Freq: Every day | INTRAVENOUS | Status: DC
  Administered 2012-07-05: 40 mg via INTRAVENOUS
  Filled 2012-07-05 (×3): qty 40

## 2012-07-05 MED ORDER — FENTANYL CITRATE 0.05 MG/ML IJ SOLN
INTRAMUSCULAR | Status: DC | PRN
  Administered 2012-07-05: 100 ug via INTRAVENOUS
  Administered 2012-07-05: 25 ug via INTRAVENOUS
  Administered 2012-07-05: 50 ug via INTRAVENOUS

## 2012-07-05 MED ORDER — HYDROMORPHONE HCL PF 1 MG/ML IJ SOLN
0.2500 mg | INTRAMUSCULAR | Status: DC | PRN
  Administered 2012-07-05: 0.5 mg via INTRAVENOUS

## 2012-07-05 SURGICAL SUPPLY — 39 items
BANDAGE CONFORM 2  STR LF (GAUZE/BANDAGES/DRESSINGS) ×1 IMPLANT
BLADE SURG 15 STRL LF DISP TIS (BLADE) ×1 IMPLANT
BLADE SURG 15 STRL SS (BLADE) ×2
CANISTER SUCTION 2500CC (MISCELLANEOUS) ×2 IMPLANT
CLOTH BEACON ORANGE TIMEOUT ST (SAFETY) ×2 IMPLANT
COVER SURGICAL LIGHT HANDLE (MISCELLANEOUS) ×2 IMPLANT
DRSG PAD ABDOMINAL 8X10 ST (GAUZE/BANDAGES/DRESSINGS) ×2 IMPLANT
ELECT CAUTERY BLADE 6.4 (BLADE) ×1 IMPLANT
ELECT REM PT RETURN 9FT ADLT (ELECTROSURGICAL) ×2
ELECTRODE REM PT RTRN 9FT ADLT (ELECTROSURGICAL) IMPLANT
GAUZE PACKING IODOFORM 1 (PACKING) IMPLANT
GLOVE BIO SURGEON STRL SZ7 (GLOVE) ×2 IMPLANT
GLOVE BIOGEL PI IND STRL 6.5 (GLOVE) IMPLANT
GLOVE BIOGEL PI IND STRL 7.0 (GLOVE) IMPLANT
GLOVE BIOGEL PI INDICATOR 6.5 (GLOVE) ×1
GLOVE BIOGEL PI INDICATOR 7.0 (GLOVE) ×2
GLOVE ECLIPSE 6.5 STRL STRAW (GLOVE) ×1 IMPLANT
GLOVE SURG SIGNA 7.5 PF LTX (GLOVE) ×2 IMPLANT
GOWN PREVENTION PLUS XLARGE (GOWN DISPOSABLE) ×2 IMPLANT
GOWN STRL NON-REIN LRG LVL3 (GOWN DISPOSABLE) ×4 IMPLANT
KIT BASIN OR (CUSTOM PROCEDURE TRAY) ×2 IMPLANT
KIT ROOM TURNOVER OR (KITS) ×2 IMPLANT
NEEDLE 22X1 1/2 (OR ONLY) (NEEDLE) ×1 IMPLANT
NS IRRIG 1000ML POUR BTL (IV SOLUTION) ×2 IMPLANT
PACK LITHOTOMY IV (CUSTOM PROCEDURE TRAY) ×2 IMPLANT
PAD ARMBOARD 7.5X6 YLW CONV (MISCELLANEOUS) ×3 IMPLANT
PENCIL BUTTON HOLSTER BLD 10FT (ELECTRODE) ×1 IMPLANT
SPONGE GAUZE 4X4 12PLY (GAUZE/BANDAGES/DRESSINGS) ×2 IMPLANT
SPONGE LAP 18X18 X RAY DECT (DISPOSABLE) ×1 IMPLANT
SWAB COLLECTION DEVICE MRSA (MISCELLANEOUS) ×2 IMPLANT
SYR BULB 3OZ (MISCELLANEOUS) ×1 IMPLANT
SYR CONTROL 10ML LL (SYRINGE) ×1 IMPLANT
TOWEL OR 17X24 6PK STRL BLUE (TOWEL DISPOSABLE) ×2 IMPLANT
TOWEL OR 17X26 10 PK STRL BLUE (TOWEL DISPOSABLE) ×2 IMPLANT
TUBE ANAEROBIC SPECIMEN COL (MISCELLANEOUS) ×2 IMPLANT
TUBE CONNECTING 12X1/4 (SUCTIONS) ×2 IMPLANT
UNDERPAD 30X30 INCONTINENT (UNDERPADS AND DIAPERS) ×2 IMPLANT
WATER STERILE IRR 1000ML POUR (IV SOLUTION) ×1 IMPLANT
YANKAUER SUCT BULB TIP NO VENT (SUCTIONS) ×2 IMPLANT

## 2012-07-05 NOTE — H&P (Signed)
I have seen and examined the patient and agree with the assessment and plans.  Jyquan Kenley A. Ayjah Show  MD, FACS  

## 2012-07-05 NOTE — Preoperative (Signed)
Beta Blockers   Reason not to administer Beta Blockers:Not Applicable 

## 2012-07-05 NOTE — ED Provider Notes (Signed)
Medical screening examination/treatment/procedure(s) were conducted as a shared visit with non-physician practitioner(s) and myself.  I personally evaluated the patient during the encounter  Doug Sou, MD 07/05/12 [phone]

## 2012-07-05 NOTE — Transfer of Care (Signed)
Immediate Anesthesia Transfer of Care Note  Patient: Tabitha Wilcox  Procedure(s) Performed: Procedure(s): IRRIGATION AND DEBRIDEMENT RIGHT LABIAL/THIGH ABSCESS (Right)  Patient Location: PACU  Anesthesia Type:General  Level of Consciousness: awake, alert  and oriented  Airway & Oxygen Therapy: Patient Spontanous Breathing and Patient connected to nasal cannula oxygen  Post-op Assessment: Report given to PACU RN  Post vital signs: Reviewed and stable  Complications: No apparent anesthesia complications

## 2012-07-05 NOTE — Progress Notes (Signed)
Patient ID: Tabitha Wilcox, female   DOB: [birth year], 56 y.o.   MRN: [id number]  Agree with H and P Plan on I and D of right labia/thigh abscess in OR today. Procedure and risks discussed with patient in detail

## 2012-07-05 NOTE — Op Note (Signed)
IRRIGATION AND DEBRIDEMENT RIGHT LABIAL/THIGH ABSCESS  Procedure Note  CAMALA TALWAR 07/05/2012   Pre-op Diagnosis: right labial/thigh abscess     Post-op Diagnosis: right proximal thigh abscess  Procedure(s): IRRIGATION AND DEBRIDEMENT RIGHT THIGH ABSCESS  Surgeon(s): Shelly Rubenstein, MD  Anesthesia: General  Staff:  Circulator: Pauletta Browns, RN Relief Circulator: Gerre Pebbles Sipsis, RN Relief Scrub: Jani Files, CST Scrub Person: Gerre Pebbles Sipsis, RN Circulator Assistant: Doy Mince, RN  Estimated Blood Loss: Minimal                         Junita Kubota A   Date: 07/05/2012  Time: 3:04 PM

## 2012-07-05 NOTE — ED Provider Notes (Signed)
Presents with "infection" at right talk ring to right thigh onset 2 days ago. Area is painful and swollen she treated it with warm compresses, without relief. No vomiting no known fever no other complaint. On exam patient with call pulseless fluctuant area at right talk with reddened  area two thirds of the way down medial thigh   Doug Sou, MD 07/05/12 [phone]

## 2012-07-05 NOTE — ED Provider Notes (Signed)
History     CSN: [id number]  Arrival date & time 07/05/12  0718   First MD Initiated Contact with Patient 07/05/12 [phone]      Chief Complaint  Patient presents with  . Wound Infection    (Consider location/radiation/quality/duration/timing/severity/associated sxs/prior treatment) HPI Comments: Tabitha Wilcox is a 57 y/o with PMHx of asthma, depression, GERD, anxiety, sciatica presenting to the ED with abscess formation to the right inner thigh and perineum region. Patient reported that on Saturday she noticed mild itching to the inner right thigh that eventually developed into an abscess - stated that the pain, redness, and swelling have gotten progressively worse over the past couple of days. Stated that pain is more of a burning sensation. Patient reported that she is very uncomfortable, hurts when walking when her thighs hit together and hurts when she sits down - stated that she has been having to stand up when urinating because any pressure to site is painful. Patient reported that she has been using hot compressions with zinc topicals with little reliegf. Reported that last night she was unable to sleep due to discomfort - reported that her left thigh, posterior aspect, started to cramp up. She has been having an ongoing headache since yesterday, described as a throbbing sensation that is constant - has used Tylenol with little relief. Denied dizziness, visual distortions, facial asymmetry, facial drooping, loss of sensation, numbness, paresthesias, gi symptoms, urinary symptoms, chest pain, shortness of breathe, difficulty breathing, fever, chills.   PCP: Almyra Deforest  The history is provided by the patient. No language interpreter was used.    Past Medical History  Diagnosis Date  . Asthma   . Tobacco abuse   . Depression   . GERD (gastroesophageal reflux disease)   . Alcoholism   . Anxiety   . Arthritis   . Carpal tunnel syndrome on both sides   . Rotator cuff injury    Right side  . Sciatica   . Ulcer     1990's    Past Surgical History  Procedure Laterality Date  . Appendectomy    . Upper gastrointestinal endoscopy      Family History  Problem Relation Age of Onset  . Colon cancer Maternal Aunt   . Colon cancer Paternal Uncle     History  Substance Use Topics  . Smoking status: Current Every Day Smoker -- 0.20 packs/day    Types: Cigarettes  . Smokeless tobacco: Never Used     Comment: cuting back. Taking Wellbutrin.  . Alcohol Use: 7.2 oz/week    12 Cans of beer per week     Comment: Drinks beer occasionally (beer once or twice a week)    OB History   Grav Para Term Preterm Abortions TAB SAB Ect Mult Living                  Review of Systems  Constitutional: Negative for fever and chills.  HENT: Negative for ear pain, sore throat, trouble swallowing, neck pain and tinnitus.   Eyes: Negative for pain and visual disturbance.  Respiratory: Negative for cough, chest tightness and shortness of breath.   Cardiovascular: Negative for chest pain.  Gastrointestinal: Negative for nausea, vomiting, abdominal pain, diarrhea and constipation.  Genitourinary: Negative for dysuria, hematuria, decreased urine volume and difficulty urinating.  Musculoskeletal: Negative for back pain.  Skin: Positive for color change. Negative for wound.       Abscess to right inner thigh and right labia majora  Neurological: Negative for dizziness, weakness, light-headedness, numbness and headaches.  All other systems reviewed and are negative.    Allergies  Review of patient's allergies indicates no known allergies.  Home Medications   Current Outpatient Rx  Name  Route  Sig  Dispense  Refill  . albuterol (PROVENTIL HFA;VENTOLIN HFA) 108 (90 BASE) MCG/ACT inhaler   Inhalation   Inhale 2 puffs into the lungs every 6 (six) hours as needed.   1 Inhaler   2   . baclofen (LIORESAL) 10 MG tablet   Oral   Take 1 tablet (10 mg total) by mouth 2 (two)  times daily.   60 tablet   1   . buPROPion (WELLBUTRIN XL) 150 MG 24 hr tablet   Oral   Take 150 mg by mouth daily.         [name] CALCIUM PO   Oral   Take 1 tablet by mouth daily.         . Cyanocobalamin (VITAMIN B-12 PO)   Oral   Take 1 capsule by mouth daily.         . fish oil-omega-3 fatty acids 1000 MG capsule   Oral   Take by mouth daily.         [name] FLUoxetine (PROZAC) 20 MG capsule   Oral   Take 20 mg by mouth daily. Prescribed by Dr Biagio Borg         . hydrOXYzine (ATARAX/VISTARIL) 25 MG tablet   Oral   Take 25 mg by mouth 2 (two) times daily as needed. Prescribed by Dr Marybelle Killings         . meloxicam (MOBIC) 7.5 MG tablet   Oral   Take 7.5 mg by mouth daily.         . Multiple Vitamins-Minerals (HAIR/SKIN/NAILS PO)   Oral   Take 1 tablet by mouth daily.         . Multiple Vitamins-Minerals (MULTIVITAMIN PO)   Oral   Take 1 capsule by mouth daily.         . traMADol (ULTRAM) 50 MG tablet   Oral   Take 1 tablet (50 mg total) by mouth 2 (two) times daily as needed for pain.   30 tablet   0   . traZODone (DESYREL) 100 MG tablet   Oral   Take 100 mg by mouth at bedtime. Prescribed Dr Biagio Borg           BP 129/74  Pulse 105  Temp(Src) 99.8 F (37.7 C) (Oral)  Resp 19  SpO2 99%  Physical Exam  Nursing note and vitals reviewed. Constitutional: She is oriented to person, place, and time. She appears well-developed and well-nourished. No distress.  HENT:  Head: Normocephalic and atraumatic.  Mouth/Throat: Oropharynx is clear and moist. No oropharyngeal exudate.  Uvula midline, symmetrical elevation  Eyes: Conjunctivae and EOM are normal. Pupils are equal, round, and reactive to light. Right eye exhibits no discharge. Left eye exhibits no discharge.  Neck: Normal range of motion. Neck supple. No tracheal deviation present.  Negative nuchal rigidity Negative lymphadenopathy  Cardiovascular: Normal rate and normal heart  sounds.  Exam reveals no friction rub.   No murmur heard. Mild tachycardic Radial pulses 2+ bilaterally Pedal pulses 2+ bilaterally Negative leg and ankle swelling Negative pitting edema  Pulmonary/Chest: Effort normal and breath sounds normal. No respiratory distress. She has no wheezes. She has no rales.  Genitourinary:     Musculoskeletal: Normal range of motion. She exhibits  no edema.  Lymphadenopathy:    She has no cervical adenopathy.  Neurological: She is alert and oriented to person, place, and time. No cranial nerve deficit. She exhibits normal muscle tone. Coordination normal.  Skin: Skin is warm and dry. No rash noted. She is not diaphoretic. There is erythema.     Inflammation and abscess noted to the right inner thigh and right lower labia majora. Mild fluctuance to right inner thigh abscess, induration to right lower labia majora extending to right perirectal region. Warmth to touch noted. Tenderness upon palpation to these regions. Erythema noted to right inner thigh, spreading noted.   Psychiatric: She has a normal mood and affect. Her behavior is normal. Thought content normal.    ED Course  Procedures (including critical care time)  8:50AM Dr. Ethelda Chick assessed by patient - recommended general surgery to be on board.  CBC, Chem 8, IV saline lock, NS IV fluids 2L, morphine 2 mg IV ordered.   9:27AM General surgery to see patient. Clindamycin IVPB 900 mg ordered for infection coverage.    10:33AM CBC elevated WBC (19.0) with left shift (neutro 89). Chem-8 hyponatremia (133) - patient being hydrated and IV antibiotics given. Patient needs to get abscess removed and taken care of.   11:08AM patient reassessed- appears calm when laying in bed. Reported improvement in pain.   Patient seen by general surgery - going to be admitted to general surgery and I&D procedure performed for removal of right thigh and perineum abscess in OR setting.    Labs Reviewed  CBC WITH  DIFFERENTIAL - Abnormal; Notable for the following:    WBC 19.0 (*)    HCT 34.3 (*)    Neutrophils Relative 89 (*)    Neutro Abs 16.9 (*)    Lymphocytes Relative 6 (*)    All other components within normal limits  POCT I-STAT, CHEM 8 - Abnormal; Notable for the following:    Sodium 133 (*)    Glucose, Bld 114 (*)    All other components within normal limits   No results found.   1. Abscess of perineum   2. Asthma   3. GERD (gastroesophageal reflux disease)       MDM  I personally evaluated and examined the patient. Patient febrile and tachycardic, elevated WBC (19.0) with left shift, mild hyponatremia (133) - patient given IV fluids and started on IV antibiotics. General surgery consult ordered and completed. Pain controlled in ED setting. Patient is to be admitted to general surgery, I&D procedure to be performed in OR this afternoon with Dr. Barrie Dunker.          Raymon Mutton, PA-C 07/05/12 1558

## 2012-07-05 NOTE — Anesthesia Preprocedure Evaluation (Signed)
Anesthesia Evaluation  Patient identified by MRN, date of birth, ID band Patient awake    Reviewed: Allergy & Precautions, H&P , NPO status , Patient's Chart, lab work & pertinent test results  Airway Mallampati: I TM Distance: >3 FB Neck ROM: full    Dental   Pulmonary asthma , Current Smoker,          Cardiovascular Rhythm:regular Rate:Normal     Neuro/Psych  Neuromuscular disease    GI/Hepatic GERD-  ,  Endo/Other    Renal/GU      Musculoskeletal   Abdominal   Peds  Hematology   Anesthesia Other Findings ETOH  Reproductive/Obstetrics                           Anesthesia Physical Anesthesia Plan  ASA: II  Anesthesia Plan: General   Post-op Pain Management:    Induction: Intravenous  Airway Management Planned: Oral ETT  Additional Equipment:   Intra-op Plan:   Post-operative Plan: Extubation in OR  Informed Consent: I have reviewed the patients History and Physical, chart, labs and discussed the procedure including the risks, benefits and alternatives for the proposed anesthesia with the patient or authorized representative who has indicated his/her understanding and acceptance.     Plan Discussed with: CRNA, Anesthesiologist and Surgeon  Anesthesia Plan Comments:         Anesthesia Quick Evaluation

## 2012-07-05 NOTE — H&P (Signed)
Tabitha Wilcox is an 57 y.o. female.   Chief Complaint: Right medial thigh and perineum abscess/cellulitis Referring Provider: Doug Sou, MD HPI: 57 yr old female with 2 day history of pain and redness in her inner right thigh and labia.  It has progressively gotten worse and became so bad she could no longer sleep or sit without severe pain.  She has also had fevers.  She has had a "boil" in her right axilla that has had to be surgically drained in the past but has otherwise not had any other abscesses.  The area began to drain today near her labia.  Past Medical History  Diagnosis Date  . Asthma   . Tobacco abuse   . Depression   . GERD (gastroesophageal reflux disease)   . Alcoholism   . Anxiety   . Arthritis   . Carpal tunnel syndrome on both sides   . Rotator cuff injury     Right side  . Sciatica   . Ulcer     1990's    Past Surgical History  Procedure Laterality Date  . Appendectomy    . Upper gastrointestinal endoscopy      Family History  Problem Relation Age of Onset  . Colon cancer Maternal Aunt   . Colon cancer Paternal Uncle    Social History:  reports that she has been smoking Cigarettes.  She has been smoking about 0.20 packs per day. She has never used smokeless tobacco. She reports that she drinks about 7.2 ounces of alcohol per week. She reports that she does not use illicit drugs.  Allergies: No Known Allergies   (Not in a hospital admission)  Results for orders placed during the hospital encounter of 07/05/12 (from the past 48 hour(s))  CBC WITH DIFFERENTIAL     Status: Abnormal   Collection Time    07/05/12  9:00 AM      Result Value Range   WBC 19.0 (*) 4.0 - 10.5 K/uL   RBC 3.92  3.87 - 5.11 MIL/uL   Hemoglobin 12.1  12.0 - 15.0 g/dL   HCT 16.1 (*) 09.6 - 04.5 %   MCV 87.5  78.0 - 100.0 fL   MCH 30.9  26.0 - 34.0 pg   MCHC 35.3  30.0 - 36.0 g/dL   RDW 40.9  81.1 - 91.4 %   Platelets 265  150 - 400 K/uL   Neutrophils Relative 89 (*) 43  - 77 %   Neutro Abs 16.9 (*) 1.7 - 7.7 K/uL   Lymphocytes Relative 6 (*) 12 - 46 %   Lymphs Abs 1.2  0.7 - 4.0 K/uL   Monocytes Relative 5  3 - 12 %   Monocytes Absolute 0.9  0.1 - 1.0 K/uL   Eosinophils Relative 0  0 - 5 %   Eosinophils Absolute 0.0  0.0 - 0.7 K/uL   Basophils Relative 0  0 - 1 %   Basophils Absolute 0.0  0.0 - 0.1 K/uL  POCT I-STAT, CHEM 8     Status: Abnormal   Collection Time    07/05/12  9:19 AM      Result Value Range   Sodium 133 (*) 135 - 145 mEq/L   Potassium 4.1  3.5 - 5.1 mEq/L   Chloride 104  96 - 112 mEq/L   BUN 10  6 - 23 mg/dL   Creatinine, Ser 7.82  0.50 - 1.10 mg/dL   Glucose, Bld 956 (*) 70 - 99 mg/dL  Calcium, Ion 1.18  1.12 - 1.23 mmol/L   TCO2 21  0 - 100 mmol/L   Hemoglobin 13.3  12.0 - 15.0 g/dL   HCT 16.1  09.6 - 04.5 %   No results found.  Review of Systems  Constitutional: Positive for fever. Negative for chills and malaise/fatigue.  HENT: Negative.   Eyes: Negative.   Respiratory: Negative.   Cardiovascular: Negative.   Gastrointestinal: Negative.   Genitourinary: Negative.   Musculoskeletal: Negative.   Skin: Positive for rash.       Pain and redness in right medial thigh and labia  Neurological: Negative.   Endo/Heme/Allergies: Negative.   Psychiatric/Behavioral: Negative.     Blood pressure 129/74, pulse 119, temperature 100.4 F (38 C), resp. rate 18, SpO2 96.00%. Physical Exam  Nursing note and vitals reviewed. Constitutional: She is oriented to person, place, and time. She appears well-developed and well-nourished.  HENT:  Head: Normocephalic and atraumatic.  Eyes: Conjunctivae are normal. Pupils are equal, round, and reactive to light.  Neck: Normal range of motion. Neck supple.  Cardiovascular: Normal rate and regular rhythm.   Respiratory: Effort normal and breath sounds normal.  GI: Soft. Bowel sounds are normal.  Genitourinary:  See skin  Musculoskeletal: Normal range of motion.  Neurological: She is  alert and oriented to person, place, and time.  Skin: Skin is warm and dry. There is erythema (medial right thigh and perinium).  Medial right thigh with baseball sized indurated area with cellulitic changes extended almost to the knee, perineum near the labia has a large hard area with a small draining area in the center, the area is very indurated and painful to the touch.   Psychiatric: She has a normal mood and affect. Her behavior is normal. Thought content normal.     Assessment/Plan 1. Right medial thigh and perineum abscess and cellulitis: the area is very extensive and extremely tender.  The patient's WBC count is also 19.  I do not believe the area will be able to be debrided at bedside adequately.  I believe that this will need to be done in the operating room.  This was explained to the patient who expresses understanding and agrees to this.  She will be admitted for now and placed on IV antibiotics, IV pain medications and kept NPO pending OR today.  Tabitha Wilcox 07/05/2012, 10:59 AM

## 2012-07-05 NOTE — ED Notes (Signed)
Has a bump on rt inner thigh that has been getting biger started since Sunday has been putting warm compresses on it now upper rt leg hurts

## 2012-07-06 ENCOUNTER — Encounter (HOSPITAL_COMMUNITY): Payer: Self-pay | Admitting: Surgery

## 2012-07-06 LAB — CBC
HCT: 29.8 % — ABNORMAL LOW (ref 36.0–46.0)
Hemoglobin: 10 g/dL — ABNORMAL LOW (ref 12.0–15.0)
MCH: 30 pg (ref 26.0–34.0)
MCHC: 33.6 g/dL (ref 30.0–36.0)
MCV: 89.5 fL (ref 78.0–100.0)
Platelets: 227 10*3/uL (ref 150–400)
RBC: 3.33 MIL/uL — ABNORMAL LOW (ref 3.87–5.11)
RDW: 14.6 % (ref 11.5–15.5)
WBC: 10.1 10*3/uL (ref 4.0–10.5)

## 2012-07-06 MED ORDER — LORAZEPAM 2 MG/ML IJ SOLN
0.5000 mg | Freq: Four times a day (QID) | INTRAMUSCULAR | Status: DC | PRN
  Administered 2012-07-06 – 2012-07-07 (×2): 0.5 mg via INTRAVENOUS
  Filled 2012-07-06 (×2): qty 1

## 2012-07-06 MED ORDER — PANTOPRAZOLE SODIUM 40 MG PO TBEC
40.0000 mg | DELAYED_RELEASE_TABLET | Freq: Every day | ORAL | Status: DC
  Administered 2012-07-06: 40 mg via ORAL
  Filled 2012-07-06: qty 1

## 2012-07-06 NOTE — Anesthesia Postprocedure Evaluation (Signed)
  Anesthesia Post-op Note  Patient: Tabitha Wilcox  Procedure(s) Performed: Procedure(s): IRRIGATION AND DEBRIDEMENT RIGHT LABIAL/THIGH ABSCESS (Right)  Patient Location: PACU  Anesthesia Type:General  Level of Consciousness: awake, oriented and patient cooperative  Airway and Oxygen Therapy: Patient Spontanous Breathing  Post-op Pain: mild  Post-op Assessment: Post-op Vital signs reviewed, Patient's Cardiovascular Status Stable, Respiratory Function Stable, Patent Airway, No signs of Nausea or vomiting and Pain level controlled  Post-op Vital Signs: stable  Complications: No apparent anesthesia complications

## 2012-07-06 NOTE — Op Note (Signed)
NAMEESSENCE, MERLE                ACCOUNT NO.:  [id number]  MEDICAL RECORD NO.:  [id number]  LOCATION:  6N30C                        FACILITY:  [facility]  PHYSICIAN:  Abigail Miyamoto, M.D. DATE OF BIRTH:  12-26-1955  DATE OF PROCEDURE:  07/05/2012 DATE OF DISCHARGE:                              OPERATIVE REPORT   PREOPERATIVE DIAGNOSIS:  Right thigh/labial abscess.  POSTOPERATIVE DIAGNOSIS:  Right proximal thigh abscess.  PROCEDURE:  Incision and drainage of the right proximal thigh abscess.  SURGEON:  Abigail Miyamoto, M.D.  ANESTHESIA:  General and 0.5% Marcaine with epinephrine.  ESTIMATED BLOOD LOSS:  Minimal.  PROCEDURE IN DETAIL:  The patient was brought to the operating room, identified as [employer].  She was placed supine on the operating room table and general anesthesia was induced.  The patient was placed in lithotomy position.  Her perianal area and proximal thigh was then prepped and draped in the usual sterile fashion.  The patient was found to have an area of fluctuance on the proximal right thigh near the buttock.  This did not go up onto the labia.  I made an incision over the fluctuant area, and drained a large abscess cavity.  I made another incision on an area of erythema that was a slightly more lateral to this, and I found no area of purulence there.  Cultures were obtained of these areas.  I then irrigated the areas with saline.  I then packed them with wet-to-dry gauze after hemostasis was achieved with cautery. I anesthetized them circumferentially with Marcaine as well.  A dry gauze and tape were then applied.  The patient tolerated the procedure well.  All the counts were correct at the end of the procedure.  The patient was then extubated in the operating room, and taken in a stable condition to recovery room.     Abigail Miyamoto, M.D.     DB/MEDQ  D:  07/05/2012  T:  07/06/2012  Job:  [id number]

## 2012-07-06 NOTE — Progress Notes (Signed)
Noted that pt had albuterol inhaler at bedside. Advised pt that she has an order for the inhaler and to notify RN if she needs to use it so that it can be documented accurately and dispensed correctly. Pt stated understanding and told me that she had used it 4 times since 1800 4/29.

## 2012-07-06 NOTE — Progress Notes (Signed)
Patient ID: Tabitha Wilcox, female   DOB: 1955-12-10, 57 y.o.   MRN: [id number] 1 Day Post-Op  Subjective: Pt feels better this morning.    Objective: Vital signs in last 24 hours: Temp:  [97.8 F (36.6 C)-99.8 F (37.7 C)] 98 F (36.7 C) (04/30 0944) Pulse Rate:  [87-118] 104 (04/30 0944) Resp:  [12-27] 18 (04/30 0944) BP: (102-135)/(58-82) 135/75 mmHg (04/30 0944) SpO2:  [93 %-99 %] 99 % (04/30 0944) Weight:  [165 lb 9.6 oz (75.116 kg)] 165 lb 9.6 oz (75.116 kg) (04/29 1722) Last BM Date: 07/05/12  Intake/Output from previous day: 04/29 0701 - 04/30 0700 In: 3330 [P.O.:720; I.V.:2510; IV Piggyback:100] Out: 25 [Blood:25] Intake/Output this shift:    PE: Skin: right thigh still with erythema tracking down the medial portion towards the mid thigh and superior towards the top of the thigh.  There is still some induration as well.  Wounds are clean with no purulent drainage note.  Some bleeding was encountered, but controlled.  Lab Results:   Recent Labs  07/05/12 0900 07/05/12 0919 07/06/12 0500  WBC 19.0*  --  10.1  HGB 12.1 13.3 10.0*  HCT 34.3* 39.0 29.8*  PLT 265  --  227   BMET  Recent Labs  07/05/12 0919  NA 133*  K 4.1  CL 104  GLUCOSE 114*  BUN 10  CREATININE 0.70   PT/INR No results found for this basename: LABPROT, INR,  in the last 72 hours CMP     Component Value Date/Time   NA 133* 07/05/2012 0919   K 4.1 07/05/2012 0919   CL 104 07/05/2012 0919   CO2 24 03/07/2012 1618   GLUCOSE 114* 07/05/2012 0919   BUN 10 07/05/2012 0919   CREATININE 0.70 07/05/2012 0919   CREATININE 0.68 03/07/2012 1618   CALCIUM 10.2 03/07/2012 1618   PROT 8.0 12/26/2010 1643   ALBUMIN 5.0 12/26/2010 1643   AST 33 12/26/2010 1643   ALT 33 12/26/2010 1643   ALKPHOS 105 12/26/2010 1643   BILITOT 0.6 12/26/2010 1643   Lipase  No results found for this basename: lipase       Studies/Results: No results found.  Anti-infectives: Anti-infectives   Start      Dose/Rate Route Frequency Ordered Stop   07/05/12 1615  piperacillin-tazobactam (ZOSYN) IVPB 3.375 g     3.375 g 12.5 mL/hr over 240 Minutes Intravenous 3 times per day 07/05/12 1608     07/05/12 0915  clindamycin (CLEOCIN) IVPB 900 mg     900 mg 100 mL/hr over 30 Minutes Intravenous  Once 07/05/12 0905 07/05/12 1214       Assessment/Plan  1. Right thigh abscess, s/p I&D  Plan: 1. Patient still needs at least another 24 hrs of IV abx therapy for her cellulitic changes to her right thigh.   2. Start NS WD dressing changes BID 3. Hopefully home tomorrow.   LOS: 1 day    Oneika Simonian E 07/06/2012, 9:45 AM Pager: [phone]

## 2012-07-06 NOTE — Progress Notes (Signed)
I have seen and examined the patient and agree with the assessment and plans.  Sivan Quast A. Eustolia Drennen  MD, FACS  

## 2012-07-06 NOTE — Progress Notes (Signed)
UR completed. Pawel Soules RNBSN 

## 2012-07-07 MED ORDER — OXYCODONE-ACETAMINOPHEN 5-325 MG PO TABS: 1.0000 | ORAL_TABLET | ORAL | Status: DC | PRN

## 2012-07-07 MED ORDER — DOXYCYCLINE HYCLATE 50 MG PO CAPS: 100.0000 mg | ORAL_CAPSULE | Freq: Two times a day (BID) | ORAL | Status: DC

## 2012-07-07 NOTE — Progress Notes (Signed)
Patient discharged to home.  Discharge teaching done including follow up care, medications, and signs and symptoms of infection.  Instructed on dressing change, patient used a mirror to observe and assist with dressing change.  Verbalizes understanding on wet to dry dressing change.  [facility] to follow patient at home to continue teaching, patient aware.  Vital signs stable.  Discharged per wheelchair with family.

## 2012-07-07 NOTE — Care Management Note (Signed)
  Page 1 of 1   07/07/2012     10:42:53 AM   CARE MANAGEMENT NOTE 07/07/2012  Patient:  Tabitha Wilcox, Tabitha Wilcox   Account Number:  [id number]  Date Initiated:  07/07/2012  Documentation initiated by:  Ronny Flurry  Subjective/Objective Assessment:     Action/Plan:   Anticipated DC Date:  07/07/2012   Anticipated DC Plan:  HOME W HOME HEALTH SERVICES         Choice offered to / List presented to:  C-1 Patient        HH arranged  HH-1 RN      [facility] agency  [facility]   Status of service:  Completed, signed off Medicare Important Message given?   (If response is "NO", the following Medicare IM given date fields will be blank) Date Medicare IM given:   Date Additional Medicare IM given:    Discharge Disposition:  HOME W HOME HEALTH SERVICES  Per UR Regulation:  Reviewed for med. necessity/level of care/duration of stay  If discussed at Long Length of Stay Meetings, dates discussed:    Comments:  07-07-12 Facesheet information confirmed with patient. Ronny Flurry RN BSN [id number]

## 2012-07-07 NOTE — Discharge Summary (Signed)
Patient ID: ARMELLA STOGNER MRN: [id number] DOB/AGE: 56-24-1957 56 y.o.  Admit date: 07/05/2012 Discharge date: 07/07/2012  Procedures:IRRIGATION AND DEBRIDEMENT RIGHT THIGH ABSCESS   Consults: None  Reason for Admission: 57 yr old female with 2 day history of pain and redness in her inner right thigh and labia. It has progressively gotten worse and became so bad she could no longer sleep or sit without severe pain. She has also had fevers. She has had a "boil" in her right axilla that has had to be surgically drained in the past but has otherwise not had any other abscesses. The area began to drain today near her labia.   Admission Diagnoses:  1. Right medial thigh and perineum abscess and cellulitis: Patient Active Problem List   Diagnosis Date Noted  . Screening for cervical cancer 05/31/2012  . Special screening for malignant neoplasms, colon 08/19/2011  . Medial meniscus tear 07/14/2011  . Right arm numbness 06/10/2011  . Tobacco abuse 04/13/2011  . Knee pain, bilateral 03/31/2011  . Ankle pain 03/12/2011  . Dysphagia 03/12/2011  . Asthma 12/26/2010  . GERD (gastroesophageal reflux disease) 12/26/2010  . Carpal tunnel syndrome on both sides   . Rotator cuff injury   . Sciatica   . BACK PAIN 02/08/2009  . DEPRESSION 12/27/2008    Hospital Course: the patient was admitted and taken to the operating room where she had an I&D of her right thigh abscess in 2 separate places.  She did great from this.  On POD1, she still had some erythema; however, her dressing change was completed with no problems.  One more day of IV abx was given.  On POD2, she was looking better and felt better.  She was stable for dc home.  PE: Skin: less erythema today.  Still with some induration.  Wounds are all clean.  Discharge Diagnoses:  Right medial thigh and perineum abscess and cellulitis: Patient Active Problem List   Diagnosis Date Noted  . Screening for cervical cancer 05/31/2012  . Special  screening for malignant neoplasms, colon 08/19/2011  . Medial meniscus tear 07/14/2011  . Right arm numbness 06/10/2011  . Tobacco abuse 04/13/2011  . Knee pain, bilateral 03/31/2011  . Ankle pain 03/12/2011  . Dysphagia 03/12/2011  . Asthma 12/26/2010  . GERD (gastroesophageal reflux disease) 12/26/2010  . Carpal tunnel syndrome on both sides   . Rotator cuff injury   . Sciatica   . BACK PAIN 02/08/2009  . DEPRESSION 12/27/2008     Discharge Medications:   Medication List    TAKE these medications       albuterol 108 (90 BASE) MCG/ACT inhaler  Commonly known as:  PROVENTIL HFA;VENTOLIN HFA  Inhale 2 puffs into the lungs every 6 (six) hours as needed.     baclofen 10 MG tablet  Commonly known as:  LIORESAL  Take 1 tablet (10 mg total) by mouth 2 (two) times daily.     buPROPion 150 MG 24 hr tablet  Commonly known as:  WELLBUTRIN XL  Take 150 mg by mouth daily.     CALCIUM PO  Take 1 tablet by mouth daily.     doxycycline 50 MG capsule  Commonly known as:  VIBRAMYCIN  Take 2 capsules (100 mg total) by mouth 2 (two) times daily.     fish oil-omega-3 fatty acids 1000 MG capsule  Take by mouth daily.     FLUoxetine 20 MG capsule  Commonly known as:  PROZAC  Take 20 mg by  mouth daily. Prescribed by Dr Biagio Borg     hydrOXYzine 25 MG tablet  Commonly known as:  ATARAX/VISTARIL  Take 25 mg by mouth 2 (two) times daily as needed. Prescribed by Dr Marybelle Killings     meloxicam 7.5 MG tablet  Commonly known as:  MOBIC  Take 7.5 mg by mouth daily.     MULTIVITAMIN PO  Take 1 capsule by mouth daily.     HAIR/SKIN/NAILS PO  Take 1 tablet by mouth daily.     oxyCODONE-acetaminophen 5-325 MG per tablet  Commonly known as:  PERCOCET/ROXICET  Take 1-2 tablets by mouth every 4 (four) hours as needed.     traMADol 50 MG tablet  Commonly known as:  ULTRAM  Take 1 tablet (50 mg total) by mouth 2 (two) times daily as needed for pain.     traZODone 100 MG tablet   Commonly known as:  DESYREL  Take 100 mg by mouth at bedtime. Prescribed Dr Biagio Borg     VITAMIN B-12 PO  Take 1 capsule by mouth daily.        Discharge Instructions: Follow-up Information   Follow up with Shelly Rubenstein, MD On 07/14/2012. (our office will call you with appointment time)    Contact information:   [address] [place] [address] [phone]       Signed: Letha Cape 07/07/2012, 9:33 AM

## 2012-07-08 ENCOUNTER — Encounter: Payer: Self-pay | Admitting: Internal Medicine

## 2012-07-08 LAB — CULTURE, ROUTINE-ABSCESS

## 2012-07-10 LAB — ANAEROBIC CULTURE

## 2012-07-14 ENCOUNTER — Encounter (INDEPENDENT_AMBULATORY_CARE_PROVIDER_SITE_OTHER): Payer: Self-pay | Admitting: Surgery

## 2012-07-14 ENCOUNTER — Ambulatory Visit (INDEPENDENT_AMBULATORY_CARE_PROVIDER_SITE_OTHER): Payer: Medicaid Other | Admitting: Surgery

## 2012-07-14 VITALS — BP 118/68 | HR 107 | Temp 96.2°F | Ht 62.0 in | Wt 170.4 lb

## 2012-07-14 DIAGNOSIS — Z09 Encounter for follow-up examination after completed treatment for conditions other than malignant neoplasm: Secondary | ICD-10-CM

## 2012-07-14 NOTE — Progress Notes (Signed)
Subjective:     Patient ID: Tabitha Wilcox, female   DOB: 1955-08-06, 57 y.o.   MRN: [id number]  HPI She is here for her first postop visit status post incision and drainage of a right proximal thigh/labial abscess. She is undergoing wet to dry dressing changes daily. She is finishing her course of doxycycline. She is doing well and has no complaints  Review of Systems     Objective:   Physical Exam On exam, the wounds are healing well with excellent granulation tissue. There is no erythema or induration    Assessment:     Patient stable postop     Plan:     She will remove the dressing changes to just once a day. I will see her back in 2 weeks

## 2012-07-19 ENCOUNTER — Telehealth (INDEPENDENT_AMBULATORY_CARE_PROVIDER_SITE_OTHER): Payer: Self-pay | Admitting: General Surgery

## 2012-07-19 NOTE — Telephone Encounter (Signed)
Tabitha Wilcox with [facility] calling to see if okay to continue seeing patient once weekly for packing of open thigh wound. Per Dr Magnus Ivan this is okay and Tabitha Wilcox made aware. They will call with any problems. Wound looking good.

## 2012-07-25 ENCOUNTER — Encounter: Payer: Medicaid Other | Admitting: Internal Medicine

## 2012-07-29 ENCOUNTER — Encounter (INDEPENDENT_AMBULATORY_CARE_PROVIDER_SITE_OTHER): Payer: Medicaid Other | Admitting: Surgery

## 2012-08-15 ENCOUNTER — Encounter: Payer: Self-pay | Admitting: Internal Medicine

## 2012-08-15 ENCOUNTER — Telehealth: Payer: Self-pay | Admitting: *Deleted

## 2012-08-15 ENCOUNTER — Ambulatory Visit (INDEPENDENT_AMBULATORY_CARE_PROVIDER_SITE_OTHER): Payer: Medicaid Other | Admitting: Internal Medicine

## 2012-08-15 VITALS — BP 149/93 | HR 79 | Temp 98.8°F | Ht 62.0 in | Wt 169.4 lb

## 2012-08-15 DIAGNOSIS — F1021 Alcohol dependence, in remission: Secondary | ICD-10-CM | POA: Insufficient documentation

## 2012-08-15 DIAGNOSIS — F1010 Alcohol abuse, uncomplicated: Secondary | ICD-10-CM

## 2012-08-15 DIAGNOSIS — F10239 Alcohol dependence with withdrawal, unspecified: Secondary | ICD-10-CM

## 2012-08-15 DIAGNOSIS — F10230 Alcohol dependence with withdrawal, uncomplicated: Secondary | ICD-10-CM

## 2012-08-15 MED ORDER — THIAMINE HCL 100 MG PO TABS: 100.0000 mg | ORAL_TABLET | Freq: Every day | ORAL | Status: DC

## 2012-08-15 MED ORDER — FOLIC ACID 1 MG PO TABS: 1.0000 mg | ORAL_TABLET | Freq: Every day | ORAL | Status: DC

## 2012-08-15 MED ORDER — CHLORDIAZEPOXIDE HCL 25 MG PO CAPS: ORAL_CAPSULE | ORAL | Status: DC

## 2012-08-15 NOTE — Progress Notes (Signed)
Subjective:   Patient ID: Tabitha Wilcox female   DOB: 02/08/1956 57 y.o.   MRN: [id number]  HPI: [name] is a 57 y.o. female with past medical history significant as outlined below who presented with chills, sweats, shakiness, diarrhea, nausea. Patient reports that she stopped drinking 3 days ago and since then everything started. She used to drink at least a 6 pack/day for the past 4 month. She has been drinking on and on since age 61. She had x3 she would be drinking 20 and liquor. Underwent in the past detox program and has been going to [facility] but not on a regular basis. She never has experienced alcohol withdrawal symptoms or seizures. Patient noted that she wants to quit as soon as possible. At this point she noticed that she no longer has a craving and taste to drink alcohol. She has some beer at home but does not want to drink it. She is more than ready to quit. She also has been trying to reduce smoking. She noted her daughter is very helpful. She has been able to keep down food and drink fluids. Denies any episodes of vomiting but reports nausea with heaves.   Past Medical History  Diagnosis Date  . Asthma   . Tobacco abuse   . Depression   . GERD (gastroesophageal reflux disease)   . Alcoholism   . Anxiety   . Arthritis   . Carpal tunnel syndrome on both sides   . Rotator cuff injury     Right side  . Sciatica   . Ulcer     1990's  . Shortness of breath    Current Outpatient Prescriptions  Medication Sig Dispense Refill  . albuterol (PROVENTIL HFA;VENTOLIN HFA) 108 (90 BASE) MCG/ACT inhaler Inhale 2 puffs into the lungs every 6 (six) hours as needed.  1 Inhaler  2  . baclofen (LIORESAL) 10 MG tablet Take 1 tablet (10 mg total) by mouth 2 (two) times daily.  60 tablet  1  . buPROPion (WELLBUTRIN XL) 150 MG 24 hr tablet Take 150 mg by mouth daily.      [name] CALCIUM PO Take 1 tablet by mouth daily.      . Cyanocobalamin (VITAMIN B-12 PO) Take 1 capsule by mouth  daily.      [name] doxycycline (VIBRAMYCIN) 50 MG capsule Take 2 capsules (100 mg total) by mouth 2 (two) times daily.  28 capsule  0  . fish oil-omega-3 fatty acids 1000 MG capsule Take by mouth daily.      [name] FLUoxetine (PROZAC) 20 MG capsule Take 20 mg by mouth daily. Prescribed by Dr Biagio Borg      . hydrOXYzine (ATARAX/VISTARIL) 25 MG tablet Take 25 mg by mouth 2 (two) times daily as needed. Prescribed by Dr Marybelle Killings      . meloxicam (MOBIC) 7.5 MG tablet Take 7.5 mg by mouth daily.      . Multiple Vitamins-Minerals (HAIR/SKIN/NAILS PO) Take 1 tablet by mouth daily.      . Multiple Vitamins-Minerals (MULTIVITAMIN PO) Take 1 capsule by mouth daily.      [name] oxyCODONE-acetaminophen (PERCOCET/ROXICET) 5-325 MG per tablet Take 1-2 tablets by mouth every 4 (four) hours as needed.  30 tablet  0  . traMADol (ULTRAM) 50 MG tablet Take 1 tablet (50 mg total) by mouth 2 (two) times daily as needed for pain.  30 tablet  0  . traZODone (DESYREL) 100 MG tablet Take 100 mg by mouth at bedtime.  Prescribed Dr Biagio Borg       No current facility-administered medications for this visit.   Family History  Problem Relation Age of Onset  . Colon cancer Maternal Aunt   . Colon cancer Paternal Uncle    History   Social History  . Marital Status: Divorced    Spouse Name: N/A    Number of Children: 3  . Years of Education: N/A   Occupational History  . Unemployed     Social History Main Topics  . Smoking status: Current Every Day Smoker -- 0.20 packs/day for 25 years    Types: Cigarettes  . Smokeless tobacco: Never Used     Comment: cuting back. Taking Wellbutrin.  . Alcohol Use: No     Comment: Stopped alcohol since Thursday.  . Drug Use: No     Comment: stopped since 2 1/2 years. Before using Marijuana.   . Sexually Active: None   Other Topics Concern  . 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       Daughter, 54   Son, 58   Son, 33 (Deceased, shot at fathers home in 2007)   Does not exercise anymore due to foot/sciatica pain         Review of Systems: Constitutional: Denies fever but noted chills, diaphoresis, appetite change and fatigue.  HEENT: Denies photophobia, sore throat,trouble swallowing, neck pain, neck stiffness and tinnitus.   Respiratory: Denies SOB, DOE, cough, chest tightness,  and wheezing.   Cardiovascular: Denies chest pain  and leg swelling.  but noted palpitations Gastrointestinal: Noted nausea and diarrhea but denies vomiting, abdominal pain,  constipation, blood in stool and abdominal distention.  Genitourinary: Denies dysuria, urgency, frequency, hematuria, flank pain and difficulty urinating.  Musculoskeletal: Noted myalgias Skin: Denies pallor, rash and wound.  Neurological: Denies dizziness, seizures, syncope, weakness,  headaches.   Psychiatric/Behavioral: Denies suicidal ideation.   Objective:  Physical Exam: Filed Vitals:   08/15/12 1421  BP: 149/93  Pulse: 79  Temp: 98.8 F (37.1 C)  TempSrc: Oral  Height: 5\' 2"  (1.575 m)  Weight: 169 lb 6.4 oz (76.839 kg)  SpO2: 99%   Constitutional: Vital signs reviewed.  Patient is a well-developed and well-nourished female in no acute distress and cooperative with exam. Alert and oriented x3.  Mouth: no erythema or exudates, MMM Eyes: PERRL, EOMI, conjunctivae normal, No scleral icterus.  Neck: Supple,  Cardiovascular: RRR, S1 normal, S2 normal, no MRG, pulses symmetric and intact bilaterally Pulmonary/Chest: normal respiratory effort, CTAB, no wheezes, rales, or rhonchi Abdominal: Soft. Non-tender, non-distended, bowel sounds are normal, no masses, organomegaly, or guarding present.  Hematology: no cervical adenopathy.  Neurological: A&O x3, Strength is normal and symmetric bilaterally, cranial nerve II-XII  are grossly intact, no focal motor deficit, sensory intact to light touch bilaterally. Mild tremor present  Skin: Warm, dry and intact. No rash, cyanosis, or clubbing.  Psychiatric: Patient is tearful. speech and behavior is normal. Judgment and thought content normal.

## 2012-08-15 NOTE — Patient Instructions (Signed)
1. You have to stay with your Daughter for 1 week. ( Somebody should be with you ) 2. Please make sure that you drink and eat regularly 3. Stop taking Meloxicam  4. If you are not feeling better please call the clinic or go to the emergency room right away   5.After the one week think about going to AA group. Our social worker can help you with it .

## 2012-08-15 NOTE — Telephone Encounter (Signed)
Pt calls and states since fri she has hot/cold flashes, N&V,

## 2012-08-16 ENCOUNTER — Telehealth: Payer: Self-pay | Admitting: Internal Medicine

## 2012-08-16 LAB — COMPREHENSIVE METABOLIC PANEL
Albumin: 4.9 g/dL (ref 3.5–5.2)
BUN: 10 mg/dL (ref 6–23)
CO2: 23 mEq/L (ref 19–32)
Calcium: 10 mg/dL (ref 8.4–10.5)
Chloride: 102 mEq/L (ref 96–112)
Glucose, Bld: 144 mg/dL — ABNORMAL HIGH (ref 70–99)
Potassium: 4.4 mEq/L (ref 3.5–5.3)
Total Protein: 8 g/dL (ref 6.0–8.3)

## 2012-08-16 NOTE — Telephone Encounter (Signed)
Called to reevaluate patient . Patient did not answer the phone . Left a message to call the clinic

## 2012-08-16 NOTE — Assessment & Plan Note (Signed)
Most likely patient is experiencing alcohol withdrawal. Discussed with Dr. Aundria Rud to treat patient as an outpatient. Based on the following criteria:  No symptoms of seizures, delirium, fever, disorientation, sever tachycardia or hypertension is present. Patient is able to take oral medication. Patient may be staying with her daughter therefore reliable family member to monitor her patient. Patient has no history of delirium tremens or alcohol withdrawal seizures. CIWA score of  15. The patient was started on thiamine and folic acid. Per up to date followed detox regimen with chlordiazepoxide . Discussed with patient in length that if she is experiencing worsening symptoms she needs to call the clinic or directly go to the emergency room. Patient will be followed up in the clinic on 08/17/12.

## 2012-08-16 NOTE — Assessment & Plan Note (Signed)
The patient is interested in alcohol absence. Years ago patient underwent detox for one month and had been going to [facility] but not on a regular basis. Patient at this point is definitely interested to restart the process. Consider to discuss details during the next office visit and refer to social worker for assistance.

## 2012-08-17 ENCOUNTER — Ambulatory Visit (INDEPENDENT_AMBULATORY_CARE_PROVIDER_SITE_OTHER): Payer: Medicaid Other | Admitting: Internal Medicine

## 2012-08-17 ENCOUNTER — Encounter: Payer: Self-pay | Admitting: Internal Medicine

## 2012-08-17 VITALS — BP 108/73 | HR 97 | Temp 99.0°F | Ht 62.0 in | Wt 171.6 lb

## 2012-08-17 DIAGNOSIS — F10230 Alcohol dependence with withdrawal, uncomplicated: Secondary | ICD-10-CM

## 2012-08-17 DIAGNOSIS — F1020 Alcohol dependence, uncomplicated: Secondary | ICD-10-CM

## 2012-08-17 DIAGNOSIS — F10239 Alcohol dependence with withdrawal, unspecified: Secondary | ICD-10-CM

## 2012-08-17 NOTE — Progress Notes (Addendum)
Patient ID: Tabitha Wilcox, female   DOB: December 04, 1955, 56 y.o.   MRN: [id number]  Subjective:   Patient ID: Tabitha Wilcox female   DOB: 1955-08-24 57 y.o.   MRN: [id number]  HPI: [name] is a 57 y.o. female with history of EtOH abuse, asthma, arthritis and GERD presenting to the clinic for followup of EtOH withdrawal. Was seem in the clinic 2 days ago by Dr. Loistine Chance. At that time complained of chills/sweats/shakiness 3 days after last EtOH drink. History of heavy EtOH abuse (beer, liquor). Has undergone detox/AA before, fell off wagon. At appt 2 days ago, wanting to detox, at home if possible. Plan to treat as outpatient as she was without any seizures, delirium , or autonomic instability.  She was started on folate and thiamine. Plan for detox on chlordiazepoxide 50mg  BID on day 1, 25mg  q6h day 2, 25mg  BID day 3, and 25mg  once on day 4.  Reports compliance with above regimen. No 6 days passed since last EtOH drink. Symptoms have completely resolved at this point. Denies any chills, shaking, fever, heart racing, nausea, vomiting, abdominal pain, confusion, delusions. Feels safe with her abstinence plan. Is living with her daughter, calls to check on her multiple times per day. Denies craving alcohol. Has scheduled regular daily activities including walking and meals to help with her routine. Is interested in speaking to her clinical social worker about maintenance options.    Past Medical History  Diagnosis Date  . Asthma   . Tobacco abuse   . Depression   . GERD (gastroesophageal reflux disease)   . Alcoholism   . Anxiety   . Arthritis   . Carpal tunnel syndrome on both sides   . Rotator cuff injury     Right side  . Sciatica   . Ulcer     1990's  . Shortness of breath    Current Outpatient Prescriptions  Medication Sig Dispense Refill  . albuterol (PROVENTIL HFA;VENTOLIN HFA) 108 (90 BASE) MCG/ACT inhaler Inhale 2 puffs into the lungs every 6 (six) hours as needed.  1 Inhaler  2   . baclofen (LIORESAL) 10 MG tablet Take 1 tablet (10 mg total) by mouth 2 (two) times daily.  60 tablet  1  . buPROPion (WELLBUTRIN XL) 150 MG 24 hr tablet Take 150 mg by mouth daily.      [name] CALCIUM PO Take 1 tablet by mouth daily.      . chlordiazePOXIDE (LIBRIUM) 25 MG capsule 1. Today ( 08/15/12) : Take 50 mg ( 2 tablets) every 12 hours 2. Tuesday  (08/16/12): Take 25 mg (1 tablet) every 6 hours  3. On Wednesday ( 08/17/12) Take 25 mg twice a day  4. On Thursday (08/18/12) Take 25 mg at bedtime  15 capsule  0  . Cyanocobalamin (VITAMIN B-12 PO) Take 1 capsule by mouth daily.      . fish oil-omega-3 fatty acids 1000 MG capsule Take by mouth daily.      [name] FLUoxetine (PROZAC) 20 MG capsule Take 20 mg by mouth daily. Prescribed by Dr Biagio Borg      . folic acid (FOLVITE) 1 MG tablet Take 1 tablet (1 mg total) by mouth daily.  30 tablet  2  . hydrOXYzine (ATARAX/VISTARIL) 25 MG tablet Take 25 mg by mouth 2 (two) times daily as needed. Prescribed by Dr Marybelle Killings      . Multiple Vitamins-Minerals (HAIR/SKIN/NAILS PO) Take 1 tablet by mouth daily.      [name]  thiamine 100 MG tablet Take 1 tablet (100 mg total) by mouth daily.  30 tablet  2  . traMADol (ULTRAM) 50 MG tablet Take 1 tablet (50 mg total) by mouth 2 (two) times daily as needed for pain.  30 tablet  0  . traZODone (DESYREL) 100 MG tablet Take 100 mg by mouth at bedtime. Prescribed Dr Biagio Borg       No current facility-administered medications for this visit.   Family History  Problem Relation Age of Onset  . Colon cancer Maternal Aunt   . Colon cancer Paternal Uncle    History   Social History  . Marital Status: Divorced    Spouse Name: N/A    Number of Children: 3  . Years of Education: N/A   Occupational History  . Unemployed     Social History Main Topics  . Smoking status: Current Every Day Smoker -- 0.20 packs/day for 25 years    Types: Cigarettes  . Smokeless tobacco: Never Used     Comment: cuting back. Taking  Wellbutrin.  . Alcohol Use: No     Comment: Stopped alcohol since Thursday.  . Drug Use: No     Comment: stopped since 2 1/2 years. Before using Marijuana.   . Sexually Active: None   Other Topics Concern  . 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       Daughter, 34   Son, 45   Son, 1 (Deceased, shot at fathers home in 2007)   Does not exercise anymore due to foot/sciatica pain         Review of Systems: 10 pt ROS performed, pertinent positives and negatives noted in HPI Objective:  Physical Exam: Filed Vitals:   08/17/12 1344  BP: 108/73  Pulse: 97  Temp: 99 F (37.2 C)  TempSrc: Oral  Height: 5\' 2"  (1.575 m)  Weight: 171 lb 9.6 oz (77.837 kg)  SpO2: 98%   Constitutional: Vital signs reviewed.  Patient is a well-developed and well-nourished woman in no acute distress and cooperative with exam. Alert and oriented x3. Nontremulous. Head: Normocephalic and atraumatic Mouth: no erythema or exudates, MMM Eyes: PERRL, EOMI, conjunctivae normal, No scleral icterus.  Neck: Supple, Trachea midline normal ROM Cardiovascular: RRR, S1 normal, S2 normal, no MRG, pulses symmetric and intact bilaterally Pulmonary/Chest: CTAB  Abdominal: Soft. Non-tender, non-distended, bowel sounds are normal, no masses, organomegaly, or guarding present.  Neurological: A&O x3, Strength is normal and symmetric bilaterally, cranial nerve II-XII are intact in detail, no focal motor deficit, sensory intact to light touch bilaterally. Normal FNF, nl Romberg, gait normal, negative pronator drift. Skin: Warm, dry and intact. No rash, cyanosis, or clubbing.  Psychiatric: Normal mood and affect. Assessment & Plan:   Please see problem-based charting for assessment and plan.

## 2012-08-17 NOTE — Patient Instructions (Signed)
1. Keep doing what you are doing with your meds and vitamins. I will have our social worker get in touch with you tomorrow.  If you have any more symptoms of shaking, chills, confusion or have seizures, please go to the ED.

## 2012-08-17 NOTE — Assessment & Plan Note (Signed)
Referral made today to [facility]. Will send message to ask her to call pt tomorrow regarding options for alcohol dependence and support.

## 2012-08-17 NOTE — Assessment & Plan Note (Signed)
Withdrawal sx have resolved. No evidence of autonomic instability. Complaint w chlordiazepoxide taper. - Continue benzo taper - continue thiamine/folate - warning signs/sx and return parameters discussed - RTC in 1 month or sooner prn

## 2012-08-18 ENCOUNTER — Telehealth: Payer: Self-pay | Admitting: Licensed Clinical Social Worker

## 2012-08-18 NOTE — Telephone Encounter (Signed)
Tabitha Wilcox was referred to CSW for substance abuse referrals.  CSW placed call to Tabitha Wilcox to provide pt with resources available utilizing her insurance.  There are several agencies available to support pt.  Tabitha Wilcox requesting CSW to place information in the mail.  CSW informed Tabitha Wilcox information and resources will go out next week, in the meantime pt can utilize [employer] for walk-in services. Pt aware and denied add'l needs.

## 2012-08-23 NOTE — Progress Notes (Signed)
TEACHING ATTENDING ADDENDUM: I discussed this case with Dr. Zeimer at the time of the patient visit. I agree with the HPI, exam findings and have read the documentation provided by the resident,  and I concur with the plan of care. Please see the resident note for details of management.  

## 2012-09-07 ENCOUNTER — Encounter (INDEPENDENT_AMBULATORY_CARE_PROVIDER_SITE_OTHER): Payer: Self-pay | Admitting: Surgery

## 2012-09-07 ENCOUNTER — Ambulatory Visit (INDEPENDENT_AMBULATORY_CARE_PROVIDER_SITE_OTHER): Payer: Medicaid Other | Admitting: Surgery

## 2012-09-07 VITALS — BP 148/80 | HR 120 | Resp 18 | Ht 62.0 in | Wt 171.2 lb

## 2012-09-07 DIAGNOSIS — Z09 Encounter for follow-up examination after completed treatment for conditions other than malignant neoplasm: Secondary | ICD-10-CM

## 2012-09-07 NOTE — Progress Notes (Signed)
Subjective:     Patient ID: Tabitha Wilcox, female   DOB: 13-Oct-1955, 57 y.o.   MRN: [id number]  HPI She is here for a wound check. She has no complaints  Review of Systems     Objective:   Physical Exam On exam, her wound is completely healed    Assessment:     Healed wound     Plan:     I will see her back as needed

## 2012-09-19 ENCOUNTER — Encounter: Payer: Medicaid Other | Admitting: Internal Medicine

## 2012-09-19 NOTE — Progress Notes (Deleted)
  Subjective:    Patient ID: Tabitha Wilcox, female    DOB: 30-Jan-1956, 57 y.o.   MRN: [id number]  HPI [name] is a 57 y.o. female with history of EtOH abuse, asthma, arthritis and GERD presenting to the clinic for followup of EtOH withdrawal.  Ask her ab9ut w/d symptoms    Review of Systems     Objective:   Physical Exam        Assessment & Plan:

## 2012-09-19 NOTE — Progress Notes (Signed)
This note was created in error. This encounter was created in error - please disregard.

## 2012-10-19 ENCOUNTER — Other Ambulatory Visit: Payer: Self-pay | Admitting: *Deleted

## 2012-10-19 DIAGNOSIS — J45909 Unspecified asthma, uncomplicated: Secondary | ICD-10-CM

## 2012-10-20 MED ORDER — ALBUTEROL SULFATE HFA 108 (90 BASE) MCG/ACT IN AERS: 2.0000 | INHALATION_SPRAY | Freq: Four times a day (QID) | RESPIRATORY_TRACT | Status: DC | PRN

## 2012-10-25 NOTE — Telephone Encounter (Signed)
Albuterol rx called to [facility] - pt informed.

## 2012-11-21 ENCOUNTER — Encounter (HOSPITAL_COMMUNITY): Payer: Self-pay | Admitting: Emergency Medicine

## 2012-11-21 ENCOUNTER — Emergency Department (HOSPITAL_COMMUNITY)
Admission: EM | Admit: 2012-11-21 | Discharge: 2012-11-21 | Disposition: A | Discharge status: Home / Self Care | Payer: Medicaid Other | Source: Home / Self Care | Attending: Family Medicine | Admitting: Family Medicine

## 2012-11-21 DIAGNOSIS — L0231 Cutaneous abscess of buttock: Secondary | ICD-10-CM

## 2012-11-21 MED ORDER — SULFAMETHOXAZOLE-TRIMETHOPRIM 800-160 MG PO TABS: ORAL_TABLET | ORAL | Status: DC

## 2012-11-21 NOTE — ED Notes (Signed)
C/o abscess of left buttock with pain and swelling. Onset yesterday. Denies fever and drainage.

## 2012-11-21 NOTE — ED Provider Notes (Signed)
CSN: [id number]     Arrival date & time 11/21/12  4098 History   First MD Initiated Contact with Patient 11/21/12 [phone]     Chief Complaint  Patient presents with  . Abscess    left buttock. pain and swelling.    (Consider location/radiation/quality/duration/timing/severity/associated sxs/prior Treatment) HPI Comments: Patient presents complaining of possible infection or abscess on her left butt cheek. She has had this in the past that was so bad that had to be drained by general surgery. She says this is not as bad and is in a different spot. She first noticed this late last night. The area of pain and swelling is getting significantly better since last night. She denies any fever, chills, dyschezia, NVD. She does note that she has in orthopedic surgery scheduled for tomorrow to repair her rotator cuff.  Patient is a 57 y.o. female presenting with abscess.  Abscess Associated symptoms: no fever, no nausea and no vomiting     Past Medical History  Diagnosis Date  . Asthma   . Tobacco abuse   . Depression   . GERD (gastroesophageal reflux disease)   . Alcoholism   . Anxiety   . Arthritis   . Carpal tunnel syndrome on both sides   . Rotator cuff injury     Right side  . Sciatica   . Ulcer     1990's  . Shortness of breath    Past Surgical History  Procedure Laterality Date  . Appendectomy    . Upper gastrointestinal endoscopy    . Irrigation and debridement abscess Right 07/05/2012    Procedure: IRRIGATION AND DEBRIDEMENT RIGHT LABIAL/THIGH ABSCESS;  Surgeon: Shelly Rubenstein, MD;  Location: [facility] OR;  Service: General;  Laterality: Right;   Family History  Problem Relation Age of Onset  . Colon cancer Maternal Aunt   . Colon cancer Paternal Uncle    History  Substance Use Topics  . Smoking status: Current Every Day Smoker -- 0.20 packs/day for 25 years    Types: Cigarettes  . Smokeless tobacco: Never Used     Comment: cuting back. Taking Wellbutrin.  . Alcohol Use: No       Comment: Stopped alcohol since Thursday.   OB History   Grav Para Term Preterm Abortions TAB SAB Ect Mult Living                 Review of Systems  Constitutional: Negative for fever and chills.  Eyes: Negative for visual disturbance.  Respiratory: Negative for cough and shortness of breath.   Cardiovascular: Negative for chest pain, palpitations and leg swelling.  Gastrointestinal: Negative for nausea, vomiting and abdominal pain.  Endocrine: Negative for polydipsia and polyuria.  Genitourinary: Negative for dysuria, urgency and frequency.  Musculoskeletal: Negative for myalgias and arthralgias.  Skin:       See HPI  Neurological: Negative for dizziness, weakness and light-headedness.    Allergies  Review of patient's allergies indicates no known allergies.  Home Medications   Current Outpatient Rx  Name  Route  Sig  Dispense  Refill  . baclofen (LIORESAL) 10 MG tablet   Oral   Take 1 tablet (10 mg total) by mouth 2 (two) times daily.   60 tablet   1   . buPROPion (WELLBUTRIN XL) 150 MG 24 hr tablet   Oral   Take 150 mg by mouth daily.         [name] CALCIUM PO   Oral   Take 1 tablet  by mouth daily.         . chlordiazePOXIDE (LIBRIUM) 25 MG capsule      1. Today ( 08/15/12) : Take 50 mg ( 2 tablets) every 12 hours 2. Tuesday  (08/16/12): Take 25 mg (1 tablet) every 6 hours  3. On Wednesday ( 08/17/12) Take 25 mg twice a day  4. On Thursday (08/18/12) Take 25 mg at bedtime   15 capsule   0   . fish oil-omega-3 fatty acids 1000 MG capsule   Oral   Take by mouth daily.         [name] FLUoxetine (PROZAC) 20 MG capsule   Oral   Take 20 mg by mouth daily. Prescribed by Dr Biagio Borg         . folic acid (FOLVITE) 1 MG tablet   Oral   Take 1 tablet (1 mg total) by mouth daily.   30 tablet   2   . thiamine 100 MG tablet   Oral   Take 1 tablet (100 mg total) by mouth daily.   30 tablet   2   . traZODone (DESYREL) 100 MG tablet   Oral   Take 100 mg  by mouth at bedtime. Prescribed Dr Biagio Borg         . albuterol (PROVENTIL HFA;VENTOLIN HFA) 108 (90 BASE) MCG/ACT inhaler   Inhalation   Inhale 2 puffs into the lungs every 6 (six) hours as needed.   1 Inhaler   2   . Cyanocobalamin (VITAMIN B-12 PO)   Oral   Take 1 capsule by mouth daily.         . hydrOXYzine (ATARAX/VISTARIL) 25 MG tablet   Oral   Take 25 mg by mouth 2 (two) times daily as needed. Prescribed by Dr Marybelle Killings         . Multiple Vitamins-Minerals (HAIR/SKIN/NAILS PO)   Oral   Take 1 tablet by mouth daily.         [name] sulfamethoxazole-trimethoprim (SEPTRA DS) 800-160 MG per tablet      2 tablets PO BID for 3 days, then 1 tablet PO BID for 7 more days   28 tablet   0   . traMADol (ULTRAM) 50 MG tablet   Oral   Take 1 tablet (50 mg total) by mouth 2 (two) times daily as needed for pain.   30 tablet   0    There were no vitals taken for this visit. Physical Exam  Nursing note and vitals reviewed. Constitutional: She is oriented to person, place, and time. Vital signs are normal. She appears well-developed and well-nourished. No distress.  HENT:  Head: Normocephalic and atraumatic.  Pulmonary/Chest: Effort normal. No respiratory distress.  Neurological: She is alert and oriented to person, place, and time. She has normal strength. Coordination normal.  Skin: Skin is warm and dry. No rash noted. She is not diaphoretic.     Psychiatric: She has a normal mood and affect. Judgment normal.    ED Course  Procedures (including critical care time) Labs Review Labs Reviewed - No data to display Imaging Review No results found.  MDM   1. Cellulitis and abscess of buttock    Significant cellulitis but no definite abscess to drain.  Starting on oral ABx, re-check in 2 days.  Call ortho to see if they want to re-schedule.    Meds ordered this encounter  Medications  . sulfamethoxazole-trimethoprim (SEPTRA DS) 800-160 MG per tablet    Sig: 2  tablets PO BID for 3 days, then 1 tablet PO BID for 7 more days    Dispense:  28 tablet    Refill:  0     Graylon Good, PA-C 11/21/12 1131

## 2012-11-22 NOTE — ED Provider Notes (Signed)
Medical screening examination/treatment/procedure(s) were performed by a resident physician or non-physician practitioner and as the supervising physician I was immediately available for consultation/collaboration.  Amyr Sluder, MD    Leeland Lovelady S Kristain Hu, MD 11/22/12 1335 

## 2012-11-24 ENCOUNTER — Encounter (HOSPITAL_COMMUNITY): Payer: Self-pay | Admitting: Emergency Medicine

## 2012-11-24 ENCOUNTER — Emergency Department (INDEPENDENT_AMBULATORY_CARE_PROVIDER_SITE_OTHER)
Admission: EM | Admit: 2012-11-24 | Discharge: 2012-11-24 | Disposition: A | Discharge status: Home / Self Care | Payer: Medicaid Other | Source: Home / Self Care | Attending: Family Medicine | Admitting: Family Medicine

## 2012-11-24 DIAGNOSIS — L0231 Cutaneous abscess of buttock: Secondary | ICD-10-CM

## 2012-11-24 MED ORDER — IBUPROFEN 600 MG PO TABS: 600.0000 mg | ORAL_TABLET | Freq: Three times a day (TID) | ORAL | Status: DC

## 2012-11-24 MED ORDER — CHLORHEXIDINE GLUCONATE 4 % EX LIQD: 60.0000 mL | Freq: Every day | CUTANEOUS | Status: DC | PRN

## 2012-11-24 MED ORDER — HYDROCODONE-ACETAMINOPHEN 5-325 MG PO TABS: 1.0000 | ORAL_TABLET | Freq: Three times a day (TID) | ORAL | Status: DC | PRN

## 2012-11-24 NOTE — ED Notes (Signed)
Pt is here for a f/u of abscess/cellulitis... Seen here on 9/15 Still taking antibiotics and tolerating well... Reports she's feeling much better Voices no new concerns... Alert w/no signs of acute distress.

## 2012-11-24 NOTE — ED Provider Notes (Signed)
CSN: [id number]     Arrival date & time 11/24/12  4098 History   First MD Initiated Contact with Patient 11/24/12 1003     Chief Complaint  Patient presents with  . Follow-up   (Consider location/radiation/quality/duration/timing/severity/associated sxs/prior Treatment) HPI Comments: 57 year old female nondiabetic. With history of recurrent MRSA skin abscesses, here for followup of a left gluteal area of swelling redness and tenderness for 4 days. She was seen here 3 days ago diagnosed with a cellulitis and was started on Septra patient reports she's taking this medication consistently although the redness and pain has improved some symptoms are not resolved. Patient still with focal pain in the left gluteal area uncomfortable when sitting. Denies pain or blood with defecation. She has had intermittent headaches. Denies abdominal pain nausea or vomiting. Denies chills or fever. No spontaneous drainage.   Past Medical History  Diagnosis Date  . Asthma   . Tobacco abuse   . Depression   . GERD (gastroesophageal reflux disease)   . Alcoholism   . Anxiety   . Arthritis   . Carpal tunnel syndrome on both sides   . Rotator cuff injury     Right side  . Sciatica   . Ulcer     1990's  . Shortness of breath    Past Surgical History  Procedure Laterality Date  . Appendectomy    . Upper gastrointestinal endoscopy    . Irrigation and debridement abscess Right 07/05/2012    Procedure: IRRIGATION AND DEBRIDEMENT RIGHT LABIAL/THIGH ABSCESS;  Surgeon: Shelly Rubenstein, MD;  Location: [facility] OR;  Service: General;  Laterality: Right;   Family History  Problem Relation Age of Onset  . Colon cancer Maternal Aunt   . Colon cancer Paternal Uncle    History  Substance Use Topics  . Smoking status: Current Every Day Smoker -- 0.20 packs/day for 25 years    Types: Cigarettes  . Smokeless tobacco: Never Used     Comment: cuting back. Taking Wellbutrin.  . Alcohol Use: No     Comment: Stopped  alcohol since Thursday.   OB History   Grav Para Term Preterm Abortions TAB SAB Ect Mult Living                 Review of Systems  Constitutional: Negative for fever, chills, diaphoresis and appetite change.  Gastrointestinal: Negative for nausea, vomiting, abdominal pain, diarrhea, blood in stool and rectal pain.  Skin: Positive for rash.       Tender red mass in left gluteal area as per HPI  Neurological: Positive for headaches. Negative for dizziness.    Allergies  Review of patient's allergies indicates no known allergies.  Home Medications   Current Outpatient Rx  Name  Route  Sig  Dispense  Refill  . sulfamethoxazole-trimethoprim (SEPTRA DS) 800-160 MG per tablet      2 tablets PO BID for 3 days, then 1 tablet PO BID for 7 more days   28 tablet   0   . albuterol (PROVENTIL HFA;VENTOLIN HFA) 108 (90 BASE) MCG/ACT inhaler   Inhalation   Inhale 2 puffs into the lungs every 6 (six) hours as needed.   1 Inhaler   2   . baclofen (LIORESAL) 10 MG tablet   Oral   Take 1 tablet (10 mg total) by mouth 2 (two) times daily.   60 tablet   1   . buPROPion (WELLBUTRIN XL) 150 MG 24 hr tablet   Oral   Take 150 mg  by mouth daily.         [name] CALCIUM PO   Oral   Take 1 tablet by mouth daily.         . chlordiazePOXIDE (LIBRIUM) 25 MG capsule      1. Today ( 08/15/12) : Take 50 mg ( 2 tablets) every 12 hours 2. Tuesday  (08/16/12): Take 25 mg (1 tablet) every 6 hours  3. On Wednesday ( 08/17/12) Take 25 mg twice a day  4. On Thursday (08/18/12) Take 25 mg at bedtime   15 capsule   0   . chlorhexidine (HIBICLENS) 4 % external liquid   Topical   Apply 60 mLs (4 application total) topically daily as needed.   120 mL   0   . Cyanocobalamin (VITAMIN B-12 PO)   Oral   Take 1 capsule by mouth daily.         . fish oil-omega-3 fatty acids 1000 MG capsule   Oral   Take by mouth daily.         [name] FLUoxetine (PROZAC) 20 MG capsule   Oral   Take 20 mg by mouth daily.  Prescribed by Dr Biagio Borg         . folic acid (FOLVITE) 1 MG tablet   Oral   Take 1 tablet (1 mg total) by mouth daily.   30 tablet   2   . HYDROcodone-acetaminophen (NORCO/VICODIN) 5-325 MG per tablet   Oral   Take 1 tablet by mouth every 8 (eight) hours as needed for pain.   10 tablet   0   . hydrOXYzine (ATARAX/VISTARIL) 25 MG tablet   Oral   Take 25 mg by mouth 2 (two) times daily as needed. Prescribed by Dr Marybelle Killings         . ibuprofen (ADVIL,MOTRIN) 600 MG tablet   Oral   Take 1 tablet (600 mg total) by mouth 3 (three) times daily. Take with meals   15 tablet   0   . Multiple Vitamins-Minerals (HAIR/SKIN/NAILS PO)   Oral   Take 1 tablet by mouth daily.         [name] thiamine 100 MG tablet   Oral   Take 1 tablet (100 mg total) by mouth daily.   30 tablet   2   . traZODone (DESYREL) 100 MG tablet   Oral   Take 100 mg by mouth at bedtime. Prescribed Dr Biagio Borg          BP 113/71  Pulse 133  Temp(Src) 99.8 F (37.7 C) (Oral)  Resp 18  SpO2 96% Physical Exam  Nursing note and vitals reviewed. Constitutional: She is oriented to person, place, and time. She appears well-developed and well-nourished. She appears distressed.  Eyes: No scleral icterus.  Neck: Neck supple.  Cardiovascular: Regular rhythm, normal heart sounds and intact distal pulses.  Exam reveals no gallop and no friction rub.   No murmur heard. tachycardic on arrival (patient was in pain)  Pulmonary/Chest: Effort normal and breath sounds normal.  Abdominal: Soft. There is no tenderness.  Neurological: She is alert and oriented to person, place, and time.  Skin: Rash noted. She is not diaphoretic.  Left gluteal area: focal tenderness in mid gluteal area close to cleft about 7 cm concentric erythema with no strikes, 3 cm central fluctuation and about 3 cm surrounding induration. No perirectal pain. No spontaneous drainage.    ED Course  INCISION AND DRAINAGE Date/Time:  11/24/2012 11:16 AM Performed by:  MORENO-COLL, Solenne Manwarren Authorized by: Sharin Grave Consent: Verbal consent obtained. Risks and benefits: risks, benefits and alternatives were discussed Consent given by: patient Patient understanding: patient states understanding of the procedure being performed Patient consent: the patient's understanding of the procedure matches consent given Type: abscess Body area: lower extremity Location details: left buttock Anesthesia: local infiltration Local anesthetic: lidocaine 1% with epinephrine Anesthetic total: 5 ml Scalpel size: 11 Complexity: complex (large abscess with loculations that were broken) Drainage: purulent Drainage amount: copious Packing material: 1/4 in iodoform gauze Patient tolerance: Patient tolerated the procedure well with no immediate complications. Comments: Antibiotic ointment and dry dressing applied on top.   (including critical care time) Labs Review Labs Reviewed - No data to display Imaging Review No results found.  MDM   1. Abscess, gluteal, left    Status post incision and drainage today. Decided to send purulent sample for culture despite patient being on antibiotics for 3 days. Recommended to continue to take Septra as previously prescribed. Was asked to return tomorrow for packing removal and wound check. Asked to return earlier if new symptoms like fever, nausea vomiting.  Sharin Grave, MD 11/24/12 1125

## 2012-11-25 ENCOUNTER — Emergency Department (INDEPENDENT_AMBULATORY_CARE_PROVIDER_SITE_OTHER)
Admission: EM | Admit: 2012-11-25 | Discharge: 2012-11-25 | Disposition: A | Discharge status: Home / Self Care | Payer: Medicaid Other | Source: Home / Self Care

## 2012-11-25 ENCOUNTER — Encounter (HOSPITAL_COMMUNITY): Payer: Self-pay | Admitting: Emergency Medicine

## 2012-11-25 DIAGNOSIS — L0231 Cutaneous abscess of buttock: Secondary | ICD-10-CM

## 2012-11-25 NOTE — ED Notes (Signed)
Pt here for packing removal. States feels a lot better. No pain.

## 2012-11-25 NOTE — ED Provider Notes (Signed)
CSN: [id number]     Arrival date & time 11/25/12  1152 History   None    Chief Complaint  Patient presents with  . Follow-up    packing removal   (Consider location/radiation/quality/duration/timing/severity/associated sxs/prior Treatment) HPI Comments: Patient returns for packing removal. Doing well. Feels so much better. No fever or chills. No pain.   The history is provided by the patient.    Past Medical History  Diagnosis Date  . Asthma   . Tobacco abuse   . Depression   . GERD (gastroesophageal reflux disease)   . Alcoholism   . Anxiety   . Arthritis   . Carpal tunnel syndrome on both sides   . Rotator cuff injury     Right side  . Sciatica   . Ulcer     1990's  . Shortness of breath    Past Surgical History  Procedure Laterality Date  . Appendectomy    . Upper gastrointestinal endoscopy    . Irrigation and debridement abscess Right 07/05/2012    Procedure: IRRIGATION AND DEBRIDEMENT RIGHT LABIAL/THIGH ABSCESS;  Surgeon: Shelly Rubenstein, MD;  Location: [facility] OR;  Service: General;  Laterality: Right;   Family History  Problem Relation Age of Onset  . Colon cancer Maternal Aunt   . Colon cancer Paternal Uncle    History  Substance Use Topics  . Smoking status: Current Every Day Smoker -- 0.20 packs/day for 25 years    Types: Cigarettes  . Smokeless tobacco: Never Used     Comment: cuting back. Taking Wellbutrin.  . Alcohol Use: No     Comment: Stopped alcohol since Thursday.   OB History   Grav Para Term Preterm Abortions TAB SAB Ect Mult Living                 Review of Systems  All other systems reviewed and are negative.    Allergies  Review of patient's allergies indicates no known allergies.  Home Medications   Current Outpatient Rx  Name  Route  Sig  Dispense  Refill  . baclofen (LIORESAL) 10 MG tablet   Oral   Take 1 tablet (10 mg total) by mouth 2 (two) times daily.   60 tablet   1   . buPROPion (WELLBUTRIN XL) 150 MG 24 hr  tablet   Oral   Take 150 mg by mouth daily.         [name] CALCIUM PO   Oral   Take 1 tablet by mouth daily.         . fish oil-omega-3 fatty acids 1000 MG capsule   Oral   Take by mouth daily.         [name] FLUoxetine (PROZAC) 20 MG capsule   Oral   Take 20 mg by mouth daily. Prescribed by Dr Biagio Borg         . folic acid (FOLVITE) 1 MG tablet   Oral   Take 1 tablet (1 mg total) by mouth daily.   30 tablet   2   . Multiple Vitamins-Minerals (HAIR/SKIN/NAILS PO)   Oral   Take 1 tablet by mouth daily.         [name] thiamine 100 MG tablet   Oral   Take 1 tablet (100 mg total) by mouth daily.   30 tablet   2   . traZODone (DESYREL) 100 MG tablet   Oral   Take 100 mg by mouth at bedtime. Prescribed Dr Biagio Borg         .  albuterol (PROVENTIL HFA;VENTOLIN HFA) 108 (90 BASE) MCG/ACT inhaler   Inhalation   Inhale 2 puffs into the lungs every 6 (six) hours as needed.   1 Inhaler   2   . chlordiazePOXIDE (LIBRIUM) 25 MG capsule      1. Today ( 08/15/12) : Take 50 mg ( 2 tablets) every 12 hours 2. Tuesday  (08/16/12): Take 25 mg (1 tablet) every 6 hours  3. On Wednesday ( 08/17/12) Take 25 mg twice a day  4. On Thursday (08/18/12) Take 25 mg at bedtime   15 capsule   0   . chlorhexidine (HIBICLENS) 4 % external liquid   Topical   Apply 60 mLs (4 application total) topically daily as needed.   120 mL   0   . Cyanocobalamin (VITAMIN B-12 PO)   Oral   Take 1 capsule by mouth daily.         [name] HYDROcodone-acetaminophen (NORCO/VICODIN) 5-325 MG per tablet   Oral   Take 1 tablet by mouth every 8 (eight) hours as needed for pain.   10 tablet   0   . hydrOXYzine (ATARAX/VISTARIL) 25 MG tablet   Oral   Take 25 mg by mouth 2 (two) times daily as needed. Prescribed by Dr Marybelle Killings         . ibuprofen (ADVIL,MOTRIN) 600 MG tablet   Oral   Take 1 tablet (600 mg total) by mouth 3 (three) times daily. Take with meals   15 tablet   0   .  sulfamethoxazole-trimethoprim (SEPTRA DS) 800-160 MG per tablet      2 tablets PO BID for 3 days, then 1 tablet PO BID for 7 more days   28 tablet   0    BP 112/60  Pulse 96  Temp(Src) 98.1 F (36.7 C) (Oral)  Resp 14  SpO2 100% Physical Exam  Nursing note and vitals reviewed. Constitutional: She appears well-developed and well-nourished.  HENT:  Head: Normocephalic and atraumatic.  Neurological: She is alert.  Skin: Skin is warm and dry.  PAcking removed to buttock wound. Clean, no drainage. Still depth. No surrounding erythema or warmth is noted.   Psychiatric: Her behavior is normal.    ED Course  Wound packing Date/Time: 11/25/2012 1:19 PM Performed by: Azucena Fallen Authorized by: Azucena Fallen Consent: Verbal consent obtained. Risks and benefits: risks, benefits and alternatives were discussed Consent given by: patient Patient identity confirmed: verbally with patient Local anesthesia used: no Patient sedated: no Patient tolerance: Patient tolerated the procedure well with no immediate complications. Comments: Removed packing and repacked with 1/4" Iodoform gauze using aseptic technique.    (including critical care time) Labs Review Labs Reviewed - No data to display Imaging Review No results found.  MDM   1. Abscess of buttock    Repacked wound-Healing well. No signs of infection. Return Sunday for wound check. Sooner if increased pain, fever or chills.  Continue Antibiotic    Azucena Fallen, PA-C 11/25/12 1322

## 2012-11-29 LAB — CULTURE, ROUTINE-ABSCESS

## 2012-11-30 NOTE — ED Notes (Signed)
Review of final report shows patient has positive culture result for MRSA, and treatment is adrquate w Rx medication, sulfa. No further action required

## 2012-11-30 NOTE — ED Provider Notes (Signed)
Medical screening examination/treatment/procedure(s) were performed by resident physician or non-physician practitioner and as supervising physician I was immediately available for consultation/collaboration.   Arbor Leer DOUGLAS MD.   Laxmi Choung D Amair Shrout, MD 11/30/12 1451 

## 2012-11-30 NOTE — ED Notes (Signed)
Lab report ?

## 2012-12-13 ENCOUNTER — Ambulatory Visit: Payer: Medicaid Other | Attending: Orthopedic Surgery | Admitting: Physical Therapy

## 2012-12-13 DIAGNOSIS — R293 Abnormal posture: Secondary | ICD-10-CM | POA: Insufficient documentation

## 2012-12-13 DIAGNOSIS — IMO0001 Reserved for inherently not codable concepts without codable children: Secondary | ICD-10-CM | POA: Insufficient documentation

## 2012-12-13 DIAGNOSIS — M25619 Stiffness of unspecified shoulder, not elsewhere classified: Secondary | ICD-10-CM | POA: Insufficient documentation

## 2012-12-13 DIAGNOSIS — M25519 Pain in unspecified shoulder: Secondary | ICD-10-CM | POA: Insufficient documentation

## 2012-12-13 DIAGNOSIS — R609 Edema, unspecified: Secondary | ICD-10-CM | POA: Insufficient documentation

## 2012-12-15 ENCOUNTER — Telehealth (HOSPITAL_COMMUNITY): Payer: Self-pay | Admitting: *Deleted

## 2012-12-15 NOTE — ED Notes (Signed)
I called pt.  Pt. verified x 2 and given results.  Pt. Told she was adequately treated with Septra DS. I reviewed the [facility] MRSA instructions with her. [name] 12/15/2012

## 2012-12-19 ENCOUNTER — Encounter: Payer: Self-pay | Admitting: Internal Medicine

## 2012-12-19 ENCOUNTER — Ambulatory Visit (INDEPENDENT_AMBULATORY_CARE_PROVIDER_SITE_OTHER): Payer: Medicaid Other | Admitting: Internal Medicine

## 2012-12-19 VITALS — BP 141/87 | HR 115 | Temp 97.9°F | Ht 62.0 in | Wt 183.3 lb

## 2012-12-19 DIAGNOSIS — S46001A Unspecified injury of muscle(s) and tendon(s) of the rotator cuff of right shoulder, initial encounter: Secondary | ICD-10-CM

## 2012-12-19 DIAGNOSIS — J45901 Unspecified asthma with (acute) exacerbation: Secondary | ICD-10-CM

## 2012-12-19 DIAGNOSIS — J45909 Unspecified asthma, uncomplicated: Secondary | ICD-10-CM

## 2012-12-19 DIAGNOSIS — Z0000 Encounter for general adult medical examination without abnormal findings: Secondary | ICD-10-CM

## 2012-12-19 DIAGNOSIS — F1020 Alcohol dependence, uncomplicated: Secondary | ICD-10-CM

## 2012-12-19 DIAGNOSIS — S4980XA Other specified injuries of shoulder and upper arm, unspecified arm, initial encounter: Secondary | ICD-10-CM

## 2012-12-19 DIAGNOSIS — F17200 Nicotine dependence, unspecified, uncomplicated: Secondary | ICD-10-CM

## 2012-12-19 DIAGNOSIS — Z720 Tobacco use: Secondary | ICD-10-CM

## 2012-12-19 DIAGNOSIS — Z23 Encounter for immunization: Secondary | ICD-10-CM

## 2012-12-19 MED ORDER — TRAMADOL HCL 50 MG PO TABS
50.0000 mg | ORAL_TABLET | Freq: Four times a day (QID) | ORAL | Status: DC | PRN
Start: 2012-12-19 — End: 2013-05-08

## 2012-12-19 MED ORDER — VARENICLINE TARTRATE 0.5 MG PO TABS: 0.5000 mg | ORAL_TABLET | Freq: Two times a day (BID) | ORAL | Status: DC

## 2012-12-19 MED ORDER — ALBUTEROL SULFATE HFA 108 (90 BASE) MCG/ACT IN AERS: 2.0000 | INHALATION_SPRAY | Freq: Four times a day (QID) | RESPIRATORY_TRACT | Status: DC | PRN

## 2012-12-19 NOTE — Assessment & Plan Note (Signed)
Patient doing well and has not had a drink of alcohol since July 2014. No withdrawal symptoms today. Patient instructed to stop taking thiamine and folic acid as her diet has normalized and she is no longer drinking.

## 2012-12-19 NOTE — Assessment & Plan Note (Signed)
Patient is postmenopausal with risk factors for osteoporosis including previous alcohol abuse and smoking. Today I ordered a DEXA scan to evaluate for osteoporosis.  Patient had a flu shot today.

## 2012-12-19 NOTE — Patient Instructions (Signed)
Thank you for your visit today. Please follow up with [place] in 12 weeks.  Congratulations on quitting drinking alcohol! That is a Management consultant.  We stopped your wellbutrin today and started Chantix. Please take the Chantix as follows:  0.5mg  once daily for three days, then take 0.5mg  twice daily for 4 days, then take 1mg  (2 pills) twice daily for 11 weeks. Please see [place] in clinic after you finish so that we can assess whether or not this is working for you. Please look out for signs or symptoms of depression as Chantix can increase your risk of depression. If you experience a depressed or irritable mood, please stop taking the medication and call our clinic immediately.   I also prescribed tramadol for you to take every 6 hours as needed for your right shoulder pain.  You can stop taking folic acid and thiamine.   I ordered a DEXA bone scan for you today. Please make sure you complete this procedure.  You received the flu shot today.

## 2012-12-19 NOTE — Assessment & Plan Note (Signed)
Patient with history of asthma, no complaints of shortness of breath or cough today. She does have some questionable wheezing in her upper lung fields bilaterally. She uses her albuterol inhaler infrequently at home. I refilled her albuterol inhaler today.

## 2012-12-19 NOTE — Progress Notes (Signed)
I saw and evaluated the patient.  I personally confirmed the key portions of Dr. Chikowski's history and exam and reviewed pertinent patient test results.  The assessment, diagnosis, and plan were formulated together and I agree with the documentation in the resident's note. 

## 2012-12-19 NOTE — Assessment & Plan Note (Signed)
Patient received rotator cuff surgery on her right shoulder by Dr. supple on September 30. Patient has followed up with Dr. supple, no postop complications. Patient states she will start PT next month when she sees him again. Patient has run out of her Percocet that she received postop. Patient with significant pain today that inhibits sleep, therefore I will prescribe tramadol #80, with one refill.

## 2012-12-19 NOTE — Assessment & Plan Note (Signed)
Patient made significant progress on Wellbutrin and is now down to a half pack per day. She reports that this is stopped working for her and she would like to try Chantix. Patient instructed to stop Wellbutrin at this time. I prescribed Chantix to be taken as follows: .5 mg once daily for 3 days, then take 0.5 mg twice daily for 4 days, then take 1 mg twice a day for 11 weeks. Patient will need to follow up in clinic at this time-12 weeks. At this point a decision will be made whether or not Chantix is successful for the patient. If it is successful for the patient and she has significantly cut back or quit, I will prescribe another 12 weeks of 1 mg twice daily. Patient with history of depression and was counseled that this medication can increase the risk of depression. She was instructed to stop taking Chantix and call our clinic immediately if she experiences depressed or irritable mood.

## 2012-12-19 NOTE — Progress Notes (Signed)
Patient ID: Tabitha Wilcox, female   DOB: 05-24-1955, 57 y.o.   MRN: [id number] HPI The patient is a 57 y.o. female with a history of asthma, EtOH abuse, tobacco abuse, depression, bilateral knee pain.  Patient was last seen here in clinic back in June with active alcohol withdrawal at which time outpatient management was chosen. Patient completed a taper of chlordiazepoxide at this time and has not had a drink of alcohol since July 2014. No withdrawal symptoms at this time. Patient is doing very well and is proud that her efforts to quit drinking and have succeeded.  Patient's main complaint today is right shoulder pain status post right rotator cuff repair 2 weeks ago by Dr. Rennis Chris. Patient was prescribed Percocet at the time of the surgery which provided her with relief, however she ran out of this medication. Since running out of the Percocet patient has had trouble sleeping secondary to pain. She is try using Tylenol with no relief of her pain. Patient followed up last week with Dr. supple, no postop complications. Patient will followup with Dr. supple next month and reports at that time she will start physical therapy. She reports tramadol provided significant pain relief in the past and would like to try this medication today.   Patient has history of tobacco abuse and has been taking Wellbutrin in efforts to quit. She is down to a half pack per day over the past 1-2 months from smoking one pack per day for 30 years prior to this time. Patient reports her daughter used Chantix to quit and stopped smoking in 3 weeks. She would like to try Chantix. Patient does report having significant cravings.   Patient with history of multiple ED visits back in September 2014 for recurrent abscesses to her buttocks, positive for MRSA. Patient with no complaints of abscess today, denies fevers and chills. She completed a course of Septra recently.  ROS: General: no fevers, chills, changes in weight, changes in  appetite Skin: no rash HEENT: no blurry vision, ST Pulm: no dyspnea, cough, wheezing CV: no chest pain, palpitations, shortness of breath Abd: no abdominal pain, nausea/vomiting, diarrhea/constipation GU: no dysuria, hematuria, polyuria Ext:  See HPI  Neuro: no weakness, numbness, or tingling  Filed Vitals:   12/19/12 1447  BP: 141/87  Pulse: 115  Temp: 97.9 F (36.6 C)    Physical Exam General: alert, cooperative, and in no apparent distress HEENT: pupils equal round and reactive to light, vision grossly intact, oropharynx clear and non-erythematous, MMM Neck: supple, no lymphadenopathy Lungs: mild inspiratory wheezes to bilateral upper airway, perhaps transmitted upper airway sounds; no crackles or rhonchi  Heart: regular rate and rhythm, no murmurs, gallops, or rubs Abdomen: overweight, soft, non-tender, non-distended, normal bowel sounds Extremities: R arm in sling with decreased AROM 2/2 pain; 2+ DP pulses bilaterally, no cyanosis, clubbing, or edema Neurologic: alert & oriented X3, cranial nerves II-XII grossly intact, strength grossly intact, sensation intact to light touch  Current Outpatient Prescriptions on File Prior to Visit  Medication Sig Dispense Refill  . CALCIUM PO Take 1 tablet by mouth daily.      . Cyanocobalamin (VITAMIN B-12 PO) Take 1 capsule by mouth daily.      . fish oil-omega-3 fatty acids 1000 MG capsule Take by mouth daily.      [name] FLUoxetine (PROZAC) 20 MG capsule Take 20 mg by mouth daily. Prescribed by Dr Biagio Borg      . folic acid (FOLVITE) 1 MG tablet Take  1 tablet (1 mg total) by mouth daily.  30 tablet  2  . hydrOXYzine (ATARAX/VISTARIL) 25 MG tablet Take 25 mg by mouth 2 (two) times daily as needed. Prescribed by Dr Marybelle Killings      . Multiple Vitamins-Minerals (HAIR/SKIN/NAILS PO) Take 1 tablet by mouth daily.      . traZODone (DESYREL) 100 MG tablet Take 100 mg by mouth at bedtime. Prescribed Dr Biagio Borg      . buPROPion  (WELLBUTRIN XL) 150 MG 24 hr tablet Take 150 mg by mouth daily.      . chlorhexidine (HIBICLENS) 4 % external liquid Apply 60 mLs (4 application total) topically daily as needed.  120 mL  0  . ibuprofen (ADVIL,MOTRIN) 600 MG tablet Take 1 tablet (600 mg total) by mouth 3 (three) times daily. Take with meals  15 tablet  0  . sulfamethoxazole-trimethoprim (SEPTRA DS) 800-160 MG per tablet 2 tablets PO BID for 3 days, then 1 tablet PO BID for 7 more days  28 tablet  0  . thiamine 100 MG tablet Take 1 tablet (100 mg total) by mouth daily.  30 tablet  2   No current facility-administered medications on file prior to visit.    Assessment/Plan

## 2012-12-26 ENCOUNTER — Telehealth: Payer: Self-pay | Admitting: *Deleted

## 2012-12-26 NOTE — Telephone Encounter (Signed)
Pt called about Chantix rx - it was electronic sent on 10/13 - pt informed.  Also called [facility] to informed them of the rx.

## 2013-01-12 ENCOUNTER — Ambulatory Visit: Payer: Medicaid Other | Attending: Orthopedic Surgery | Admitting: Rehabilitation

## 2013-01-12 DIAGNOSIS — M25519 Pain in unspecified shoulder: Secondary | ICD-10-CM | POA: Insufficient documentation

## 2013-01-12 DIAGNOSIS — IMO0001 Reserved for inherently not codable concepts without codable children: Secondary | ICD-10-CM | POA: Insufficient documentation

## 2013-01-12 DIAGNOSIS — M25619 Stiffness of unspecified shoulder, not elsewhere classified: Secondary | ICD-10-CM | POA: Insufficient documentation

## 2013-01-12 DIAGNOSIS — R293 Abnormal posture: Secondary | ICD-10-CM | POA: Insufficient documentation

## 2013-01-12 DIAGNOSIS — R609 Edema, unspecified: Secondary | ICD-10-CM | POA: Insufficient documentation

## 2013-01-17 ENCOUNTER — Telehealth: Payer: Self-pay | Admitting: *Deleted

## 2013-01-17 NOTE — Telephone Encounter (Signed)
CALLED PATIENT LEFT VOICE MESSAGE/ [facility]- NOV. 18, 014 @ 10:15AM TO ARRIVE AT 10:00AM.  LELA STURIDVANT NTII 11-11-014  10:36AM

## 2013-01-19 ENCOUNTER — Ambulatory Visit: Payer: Medicaid Other | Admitting: Physical Therapy

## 2013-01-24 ENCOUNTER — Ambulatory Visit (HOSPITAL_COMMUNITY)
Admission: RE | Admit: 2013-01-24 | Discharge: 2013-01-24 | Disposition: A | Discharge status: Home / Self Care | Payer: Medicaid Other | Source: Ambulatory Visit | Attending: Internal Medicine | Admitting: Internal Medicine

## 2013-01-24 DIAGNOSIS — Z780 Asymptomatic menopausal state: Secondary | ICD-10-CM | POA: Insufficient documentation

## 2013-01-24 DIAGNOSIS — F17200 Nicotine dependence, unspecified, uncomplicated: Secondary | ICD-10-CM

## 2013-01-24 DIAGNOSIS — Z13820 Encounter for screening for osteoporosis: Secondary | ICD-10-CM | POA: Insufficient documentation

## 2013-01-26 ENCOUNTER — Ambulatory Visit: Payer: Medicaid Other | Admitting: Physical Therapy

## 2013-02-16 ENCOUNTER — Other Ambulatory Visit: Payer: Self-pay | Admitting: *Deleted

## 2013-02-16 DIAGNOSIS — J45909 Unspecified asthma, uncomplicated: Secondary | ICD-10-CM

## 2013-02-17 MED ORDER — ALBUTEROL SULFATE HFA 108 (90 BASE) MCG/ACT IN AERS: 2.0000 | INHALATION_SPRAY | Freq: Four times a day (QID) | RESPIRATORY_TRACT | Status: DC | PRN

## 2013-02-20 NOTE — Telephone Encounter (Signed)
Rx faxed in.

## 2013-03-09 HISTORY — PX: SHOULDER ARTHROSCOPY W/ ROTATOR CUFF REPAIR: SHX2400

## 2013-03-30 ENCOUNTER — Other Ambulatory Visit: Payer: Self-pay | Admitting: *Deleted

## 2013-03-30 NOTE — Telephone Encounter (Signed)
Last filled 03/03/13 

## 2013-04-03 ENCOUNTER — Encounter: Payer: Medicaid Other | Admitting: Internal Medicine

## 2013-04-12 ENCOUNTER — Other Ambulatory Visit: Payer: Self-pay | Admitting: *Deleted

## 2013-04-13 ENCOUNTER — Telehealth: Payer: Self-pay | Admitting: *Deleted

## 2013-04-13 NOTE — Telephone Encounter (Signed)
error 

## 2013-04-18 ENCOUNTER — Encounter: Payer: Self-pay | Admitting: Internal Medicine

## 2013-04-18 ENCOUNTER — Ambulatory Visit: Payer: Medicaid Other | Admitting: Internal Medicine

## 2013-05-01 ENCOUNTER — Other Ambulatory Visit: Payer: Self-pay | Admitting: *Deleted

## 2013-05-01 DIAGNOSIS — J45909 Unspecified asthma, uncomplicated: Secondary | ICD-10-CM

## 2013-05-01 MED ORDER — ALBUTEROL SULFATE HFA 108 (90 BASE) MCG/ACT IN AERS: 2.0000 | INHALATION_SPRAY | Freq: Four times a day (QID) | RESPIRATORY_TRACT | Status: DC | PRN

## 2013-05-08 ENCOUNTER — Encounter: Payer: Self-pay | Admitting: Internal Medicine

## 2013-05-08 ENCOUNTER — Ambulatory Visit (INDEPENDENT_AMBULATORY_CARE_PROVIDER_SITE_OTHER): Payer: Medicaid Other | Admitting: Internal Medicine

## 2013-05-08 VITALS — BP 119/76 | HR 100 | Temp 99.1°F | Ht 62.0 in | Wt 188.5 lb

## 2013-05-08 DIAGNOSIS — F1020 Alcohol dependence, uncomplicated: Secondary | ICD-10-CM

## 2013-05-08 DIAGNOSIS — Z720 Tobacco use: Secondary | ICD-10-CM

## 2013-05-08 DIAGNOSIS — F17200 Nicotine dependence, unspecified, uncomplicated: Secondary | ICD-10-CM

## 2013-05-08 DIAGNOSIS — M949 Disorder of cartilage, unspecified: Secondary | ICD-10-CM

## 2013-05-08 DIAGNOSIS — Z0000 Encounter for general adult medical examination without abnormal findings: Secondary | ICD-10-CM

## 2013-05-08 DIAGNOSIS — J45901 Unspecified asthma with (acute) exacerbation: Secondary | ICD-10-CM

## 2013-05-08 DIAGNOSIS — F329 Major depressive disorder, single episode, unspecified: Secondary | ICD-10-CM

## 2013-05-08 DIAGNOSIS — J45909 Unspecified asthma, uncomplicated: Secondary | ICD-10-CM

## 2013-05-08 DIAGNOSIS — M899 Disorder of bone, unspecified: Secondary | ICD-10-CM

## 2013-05-08 DIAGNOSIS — K219 Gastro-esophageal reflux disease without esophagitis: Secondary | ICD-10-CM

## 2013-05-08 DIAGNOSIS — M8580 Other specified disorders of bone density and structure, unspecified site: Secondary | ICD-10-CM

## 2013-05-08 DIAGNOSIS — F3289 Other specified depressive episodes: Secondary | ICD-10-CM

## 2013-05-08 NOTE — Assessment & Plan Note (Signed)
Stable, though does have to use her inhaler on average once per day. Mild wheezing on exam. I discussed with her the need to stop smoking as this is likely playing a role. Continue albuterol inhaler prn.

## 2013-05-08 NOTE — Patient Instructions (Signed)
Thank you for your visit today! It was nice to see you again.  Please continue taking the chantix as previously prescribed. We will see you back in 2 months to reconsider our smoking cessation therapy as we should not continue chantix past this time.  We will need to recheck a DEXA (bone density scan) in 2 years.  I ordered a mammogram for you today. They should call you to schedule this.   PLEASE BRING YOUR MEDICATIONS INCLUDING YOUR CALCIUM/VITAMIN D SUPPLEMENTS TO YOUR NEXT VISIT IN 2 MONTHS.

## 2013-05-08 NOTE — Assessment & Plan Note (Signed)
Patient is taking unknown doses of both calcium and vitamin D. I asked patient to bring these pill bottles with her to her next visit so we can make sure she is taking enough of both supplements given her recent diagnosis of osteopenia. She will need DEXA repeat in 2 years.

## 2013-05-08 NOTE — Assessment & Plan Note (Signed)
Patient's last mammogram was 1 year ago. Ordered mammogram today.

## 2013-05-08 NOTE — Progress Notes (Signed)
Patient ID: Tabitha Wilcox, female   DOB: Jul 28, 1955, 58 y.o.   MRN: [id number] HPI The patient is a 58 y.o. female with a history of asthma, EtOH abuse, tobacco abuse, depression, bilateral knee pain.  Prior hx of alcohol abuse: Patient stopped drinking in July 2014 after experiencing alcoholic withdrawal (treated as outpatient w/ chlordiazepoxide). She has since started drinking again, though this time w/ moderation. She reports that she has approx 2 beers per week and does not have cravings or feel tempted to drink more than this.   Tobacco abuse: Patient continues to smoke 1/4ppd, which is less than she had been smoking at our last visit in Oct 2014 (1/2ppd). She has tried Wellbutrin in the past, but this was stopped at our visit in October because it was not working. In October we started her on Chantix. She has been taking this medication as prescribed ever since (unclear how she obtained enough refills as I did not provide refills and she was supposed to follow up with me 12 weeks after she was started on this RX and never did). She is unsure if this is helping, but reports that her mood is stable and she would like to continue using it to cut back further.   Osteopenia: Patient had DEXA scan done 01/2013, which showed spine T score -.8 and femur T score -1.1. She takes Centrum silver and calcium supplements at home, but is unsure of the calcium/vitamin D content of each. She will try to bring these to her next appointment.   Asthma: Patient has h/o asthma and she uses an albuterol inhaler at home approx 1 time per day on average. She reports there are some days she uses it 2-3 times and other days she does not use it at all. She has a chronic dry cough that is unchanged from baseline. No SOB or wheezing. She continues to smoke as above.   R Rotator cuff repair: She underwent R rotator cuff surgery by Dr. Onnie Graham in September 2014. When I saw her last October this was quite bothersome for patient.  Today she tells me her pain is well controlled with ibuprofen prn. She no longer needs the tramadol, which she had been taking. The pain is worst at night when she sleeps.    ROS: General: no fevers, chills, changes in weight, changes in appetite Skin: no rash HEENT: no blurry vision, hearing changes, sore throat Pulm: no dyspnea, coughing, wheezing CV: no chest pain, palpitations, shortness of breath Abd: no abdominal pain, nausea/vomiting, diarrhea/constipation GU: no dysuria, hematuria, polyuria Ext: no arthralgias, myalgias Neuro: no weakness, numbness, or tingling  Filed Vitals:   05/08/13 1518  BP: 119/76  Pulse: 100  Temp: 99.1 F (37.3 C)  SpO2 98% r/a WT 188lbs   Physical Exam: General: alert, cooperative, and in no apparent distress HEENT: pupils equal round and reactive to light, vision grossly intact, oropharynx clear and non-erythematous  Neck: supple Lungs: scattered faint wheezes to b/l upper lung fields, but otherwise lungs are clear to ascultation bilaterally, normal work of respiration, no rales or rhonchi Heart: regular rate and rhythm, no murmurs, gallops, or rubs Abdomen: soft, non-tender, non-distended, normal bowel sounds Extremities: warm, no pedal edema Neurologic: alert & oriented X3, cranial nerves II-XII grossly intact, strength grossly intact, sensation intact to light touch  Current Outpatient Prescriptions on File Prior to Visit  Medication Sig Dispense Refill  . albuterol (PROVENTIL HFA;VENTOLIN HFA) 108 (90 BASE) MCG/ACT inhaler Inhale 2 puffs into the lungs  every 6 (six) hours as needed.  1 Inhaler  2  . CALCIUM PO Take 1 tablet by mouth daily.      . Cyanocobalamin (VITAMIN B-12 PO) Take 1 capsule by mouth daily.      . fish oil-omega-3 fatty acids 1000 MG capsule Take by mouth daily.      [name] FLUoxetine (PROZAC) 20 MG capsule Take 20 mg by mouth daily. Prescribed by Dr Jac Canavan      . hydrOXYzine (ATARAX/VISTARIL) 25 MG tablet Take 25 mg  by mouth 2 (two) times daily as needed. Prescribed by Dr Einar Grad      . ibuprofen (ADVIL,MOTRIN) 600 MG tablet Take 1 tablet (600 mg total) by mouth 3 (three) times daily. Take with meals  15 tablet  0  . Multiple Vitamins-Minerals (HAIR/SKIN/NAILS PO) Take 1 tablet by mouth daily.      . traZODone (DESYREL) 100 MG tablet Take 100 mg by mouth at bedtime. Prescribed Dr Jac Canavan      . varenicline (CHANTIX) 0.5 MG tablet Take 1 tablet (0.5 mg total) by mouth 2 (two) times daily.  170 tablet  0   No current facility-administered medications on file prior to visit.    Assessment/Plan

## 2013-05-08 NOTE — Assessment & Plan Note (Signed)
  Assessment: Progress toward smoking cessation:   good Barriers to progress toward smoking cessation:   lack of patient determination/motivation Comments: She continues to take chantix, though she admits to not putting the necessary effort forth herself.  Plan: Instruction/counseling given:  I counseled patient on the dangers of tobacco use, advised patient to stop smoking, and reviewed strategies to maximize success. Educational resources provided:    Self management tools provided:    Medications to assist with smoking cessation:  Varenicline (Chantix) Patient agreed to the following self-care plans for smoking cessation: set a quit date and stop smoking;cut down the number of cigarettes smoked  Other plans: Patient will continue to take Chantix for another 2 months. At this point she will have exceeded her 24 weeks of chantix and I would recommend stopping this medication. I encouraged her to be more persistent and determined herself to try to "help" the chantix work over the next 2 months. The goal is that she have stopped smoking at her next follow up visit in 2 months. If she continues to smoke, we may try another NRT or go back to wellbutrin.

## 2013-05-08 NOTE — Assessment & Plan Note (Signed)
Patient's mood is stable. She is followed by [facility] and is managed on trazodone and prozac.

## 2013-05-09 ENCOUNTER — Other Ambulatory Visit: Payer: Self-pay | Admitting: *Deleted

## 2013-05-11 NOTE — Telephone Encounter (Signed)
Patient told me at our last visit that [facility] prescribes her trazodone. Meloxicam is no longer on her medication list. At our last visit she told me she takes ibuprofen for her pain.

## 2013-05-11 NOTE — Telephone Encounter (Signed)
Spoke w/ pharm, they have contacted [facility] and they have explained to pt that meloxicam was short term

## 2013-05-30 ENCOUNTER — Other Ambulatory Visit: Payer: Self-pay | Admitting: Internal Medicine

## 2013-05-30 DIAGNOSIS — Z1231 Encounter for screening mammogram for malignant neoplasm of breast: Secondary | ICD-10-CM

## 2013-06-07 ENCOUNTER — Other Ambulatory Visit: Payer: Self-pay | Admitting: *Deleted

## 2013-06-07 NOTE — Telephone Encounter (Addendum)
Also request for Quetiapine Fumarate 100 mg Also request Meloxicam 15 mg

## 2013-06-09 ENCOUNTER — Ambulatory Visit (HOSPITAL_COMMUNITY)
Admission: RE | Admit: 2013-06-09 | Discharge: 2013-06-09 | Disposition: A | Discharge status: Home / Self Care | Payer: Medicaid Other | Source: Ambulatory Visit | Attending: Internal Medicine | Admitting: Internal Medicine

## 2013-06-09 DIAGNOSIS — Z0000 Encounter for general adult medical examination without abnormal findings: Secondary | ICD-10-CM

## 2013-06-09 DIAGNOSIS — Z1231 Encounter for screening mammogram for malignant neoplasm of breast: Secondary | ICD-10-CM | POA: Insufficient documentation

## 2013-06-14 ENCOUNTER — Ambulatory Visit (INDEPENDENT_AMBULATORY_CARE_PROVIDER_SITE_OTHER): Payer: Medicaid Other | Admitting: Internal Medicine

## 2013-06-14 ENCOUNTER — Encounter: Payer: Self-pay | Admitting: Internal Medicine

## 2013-06-14 VITALS — BP 103/66 | HR 83 | Temp 97.0°F | Wt 182.6 lb

## 2013-06-14 DIAGNOSIS — L608 Other nail disorders: Secondary | ICD-10-CM

## 2013-06-14 DIAGNOSIS — L609 Nail disorder, unspecified: Secondary | ICD-10-CM

## 2013-06-14 DIAGNOSIS — L0291 Cutaneous abscess, unspecified: Secondary | ICD-10-CM

## 2013-06-14 DIAGNOSIS — L02219 Cutaneous abscess of trunk, unspecified: Secondary | ICD-10-CM

## 2013-06-14 DIAGNOSIS — L0390 Cellulitis, unspecified: Principal | ICD-10-CM

## 2013-06-14 DIAGNOSIS — L03319 Cellulitis of trunk, unspecified: Secondary | ICD-10-CM

## 2013-06-14 NOTE — Progress Notes (Signed)
Patient ID: Tabitha Wilcox, female   DOB: 29-May-1955, 58 y.o.   MRN: [id number]    Subjective:   Patient ID: Tabitha Wilcox female   DOB: 1956-01-20 58 y.o.   MRN: [id number]  HPI: Tabitha Wilcox is a 58 y.o. woman with a pmhx of mutliple inguinal, labial, and gluteal abscesses (MRSA+) who presents with a cc of a boild in her right labia. She noticed the boil on Sunday. It progressed and she attempted to clean the area. It has mildly improved and been decreasing in size over the last 24 hours. However, she continues to have a ~1 cm diam fluctuant mass at the junction of her right labia and her inguinal crease. She denies fever chills Nausea vomiting. She admits to mild redness around the lesion.    Past Medical History  Diagnosis Date  . Asthma   . Tobacco abuse   . Depression   . GERD (gastroesophageal reflux disease)   . Alcoholism   . Anxiety   . Arthritis   . Carpal tunnel syndrome on both sides   . Rotator cuff injury     Right side  . Sciatica   . Ulcer     19 90's  . Shortness of breath    Current Outpatient Prescriptions  Medication Sig Dispense Refill  . albuterol (PROVENTIL HFA;VENTOLIN HFA) 108 (90 BASE) MCG/ACT inhaler Inhale 2 puffs into the lungs every 6 (six) hours as needed.  1 Inhaler  2  . CALCIUM PO Take 1 tablet by mouth daily.      . Cyanocobalamin (VITAMIN B-12 PO) Take 1 capsule by mouth daily.      . fish oil-omega-3 fatty acids 1000 MG capsule Take by mouth daily.      [name] FLUoxetine (PROZAC) 20 MG capsule Take 20 mg by mouth daily. Prescribed by Dr Jac Canavan      . hydrOXYzine (ATARAX/VISTARIL) 25 MG tablet Take 25 mg by mouth 2 (two) times daily as needed. Prescribed by Dr Einar Grad      . Multiple Vitamins-Minerals (HAIR/SKIN/NAILS PO) Take 1 tablet by mouth daily.      . traZODone (DESYREL) 100 MG tablet Take 100 mg by mouth at bedtime. Prescribed Dr Jac Canavan      . varenicline (CHANTIX) 0.5 MG tablet Take 1 tablet (0.5 mg total) by mouth 2  (two) times daily.  170 tablet  0   No current facility-administered medications for this visit.   Family History  Problem Relation Age of Onset  . Colon cancer Maternal Aunt   . Colon cancer Paternal Uncle    History   Social History  . Marital Status: Divorced    Spouse Name: N/A    Number of Children: 3  . Years of Education: N/A   Occupational History  . Unemployed     Social History Main Topics  . Smoking status: Current Every Day Smoker -- 0.30 packs/day for 25 years    Types: Cigarettes  . Smokeless tobacco: Never Used     Comment: cuting back. On Chantix.  [name] Alcohol Use: No     Comment: Stopped alcohol since Thursday.  . Drug Use: No     Comment: stopped since 2 1/2 years. Before using Marijuana.   . Sexual Activity: None   Other Topics Concern  . None   Social History Narrative   Occupation: Worked in Designer, industrial/product.  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       Daughter, 20   Son, 45   Son, 8 (Deceased, shot at fathers home in 2007)   Does not exercise anymore due to foot/sciatica pain         Review of Systems: Pertinent items are noted in HPI. Objective:  Physical Exam: Filed Vitals:   06/14/13 1004  BP: 103/66  Pulse: 83  Temp: 97 F (36.1 C)  TempSrc: Oral  Weight: 182 lb 9.6 oz (82.827 kg)  SpO2: 98%   Physical Exam  Constitutional: She appears well-developed and well-nourished. She appears distressed.  Cardiovascular: Normal rate, regular rhythm, normal heart sounds and intact distal pulses.  Exam reveals no friction rub.   No murmur heard. Pulmonary/Chest: Effort normal and breath sounds normal. No respiratory distress. She has no wheezes. She has no rales.  Genitourinary:     .5 cm fluctuant mass as noted.  Neurological: She is alert.  Skin: She is not diaphoretic.     Bil deformity of the patients great toenails.    Psychiatric: She has a normal mood and affect. Her behavior is normal.    Assessment & Plan:

## 2013-06-14 NOTE — Assessment & Plan Note (Addendum)
Patients symptoms are likely due to a small abscess in her groin. I and D in clinic was considered. However, the patient reported much improvement over the last 24 hours. It appears to be draining with arm compresses and cleaning.   Thus, we decided to observe the small abscess as it will likely resolve without intervention. No ABX indicated at this tie because no obvious cellulitis. Patient was instructed to contact [facility] immediately if her boil worsens or she develops symptoms such as fevers, chills, n, V.

## 2013-06-14 NOTE — Patient Instructions (Signed)
Your symptoms are likely due to a small abscess in your groin. We considered incision and drainage. Hwoever, it appears to be improving without intervention at this time.   Thus, we decided to observe the small abscess as it will likely resolve. No antibiotics are indicated at this time because no evidence of skin infection. Please contact [facility] immediately if your boil worsens or you develop symptoms such as fevers, chills, nausea, or vomitting.

## 2013-06-14 NOTE — Assessment & Plan Note (Signed)
The patient appears to have toenail defromity. Plan for referral to podiatry to evaluation and treatment as indicated.

## 2013-06-16 NOTE — Progress Notes (Signed)
Case discussed with Dr. Komanski at time of visit.  We reviewed the resident's history and exam and pertinent patient test results.  I agree with the assessment, diagnosis, and plan of care documented in the resident's note. 

## 2013-07-10 ENCOUNTER — Ambulatory Visit (INDEPENDENT_AMBULATORY_CARE_PROVIDER_SITE_OTHER): Payer: Medicaid Other | Admitting: Internal Medicine

## 2013-07-10 ENCOUNTER — Encounter: Payer: Self-pay | Admitting: Internal Medicine

## 2013-07-10 VITALS — BP 130/86 | HR 95 | Temp 97.1°F | Ht 62.0 in | Wt 185.3 lb

## 2013-07-10 DIAGNOSIS — F17200 Nicotine dependence, unspecified, uncomplicated: Secondary | ICD-10-CM

## 2013-07-10 DIAGNOSIS — M949 Disorder of cartilage, unspecified: Secondary | ICD-10-CM

## 2013-07-10 DIAGNOSIS — L609 Nail disorder, unspecified: Secondary | ICD-10-CM

## 2013-07-10 DIAGNOSIS — M8580 Other specified disorders of bone density and structure, unspecified site: Secondary | ICD-10-CM

## 2013-07-10 DIAGNOSIS — M899 Disorder of bone, unspecified: Secondary | ICD-10-CM

## 2013-07-10 DIAGNOSIS — Z720 Tobacco use: Secondary | ICD-10-CM

## 2013-07-10 DIAGNOSIS — L608 Other nail disorders: Secondary | ICD-10-CM

## 2013-07-10 DIAGNOSIS — F1020 Alcohol dependence, uncomplicated: Secondary | ICD-10-CM

## 2013-07-10 MED ORDER — NICOTINE 14 MG/24HR TD PT24: 14.0000 mg | MEDICATED_PATCH | TRANSDERMAL | Status: DC

## 2013-07-10 NOTE — Assessment & Plan Note (Signed)
Patient smoking 1/4 ppd which is improved since Oct 2014, though stable since our last visit in Feb 2015. She has completed a 24wk course of Chantix. Will stop this medication today. She has failed Wellbutrin in the past. She requests starting NRT today. Would like to try nicotine patch first. I prescribed nicoderm CQ 14mg  daily (24hr patch) today with plan of continuing this for 6 weeks with a follow up appointment at that time. If she is doing well at this point, we will plan to decrease the dose to 7mg  daily for 2 more weeks. I did instruct patient to be sure to remove the patch while smoking to avoid nicotine toxicity. She agrees.

## 2013-07-10 NOTE — Progress Notes (Signed)
Case discussed with Dr. Mechele Claude at the time of the visit.  We reviewed the resident's history and exam and pertinent patient test results.  I agree with the assessment, diagnosis, and plan of care documented in the resident's note.

## 2013-07-10 NOTE — Assessment & Plan Note (Signed)
Patient was taking an excessive amount of vitamin D at home. I asked her to stop taking her vitamin D supplement. She should continue her centrum silver and her calcium supplement. Will check vitamin D level today as this has never been evaluated. Will need repeat DEXA scan in 01/2015.

## 2013-07-10 NOTE — Assessment & Plan Note (Signed)
Unclear why podiatry never contacted patient. My nurse, Regino Schultze, is looking into this and will make sure patient is set up with an appointment.

## 2013-07-10 NOTE — Patient Instructions (Signed)
Thank you for your visit.  Please stop taking your vitamin D.  Continue taking centrum silver multivitamin and the calcium supplement. This is enough vitamin D and calcium for you.  We checked a Vitamin D level on you today.  I prescribed Nicoderm CQ patch for you today (one patch per day). Please take this off when you smoke to avoid nicotine toxicity.   We will see you back in clinic in 6 weeks.

## 2013-07-10 NOTE — Progress Notes (Signed)
Patient ID: Tabitha Wilcox, female   DOB: Aug 26, 1955, 58 y.o.   MRN: [id number] HPI The patient is a 58 y.o. female with a history of asthma, EtOH abuse, tobacco abuse, depression, bilateral knee pain presents to clinic primarily for follow up of tobacco abuse.  Tobacco abuse: Patient continues to smoke 1/4ppd, which is less than she had been smoking at our initial visit in Oct 2014 (1/2-1ppd). She has tried Wellbutrin in the past, but this was stopped in October because it was not working. Chantix was started 12/2012 and continued for an entire 24 week course (ends today). She believes the chantix did initially work somewhat but now is no longer working. She is interested in the nicotine patch.   Prior hx of alcohol abuse: Patient stopped drinking in July 2014 after experiencing alcoholic withdrawal (treated as outpatient w/ chlordiazepoxide). Pt now drinking 2 wine coolers per week, no beer, wine, liquor. Doing much better since psychosocial stressors improved last year.    Osteopenia: Patient had DEXA scan done 01/2013, which showed spine T score -.8 and femur T score -1.1. She has been taking a combination of Centrum silver, vitamin D supplement, and calcium supplement at home. She brought these bottles with her today. Total intake of calcium is 900mg  daily and vitamin D 2600 IU daily.    Toenail deformity: Patient was seen here for skin abscess about 1 month ago at which time a toenail deformity to R foot was noticed. Patient notes that for many years both of her great toenails fall off about once per year. No pain, swelling, injury, redness to these areas at any point. They just painlessly fall off. She was referred to podiatry at her last visit, though she was never called about setting up an appointment. No hx of DM or paresthesias or decreased sensation to her BLE.  ROS: General: no fevers, chills Skin: no rash HEENT: no vision changes, HA Pulm: intermittent cough, no SOB CV: no chest pain,  palpitations, shortness of breath Abd: no abdominal pain, N/V/D GU: no dysuria Ext: toenail on big toes fall off once per year, no swelling Neuro: no weakness, numbness, or tingling  Filed Vitals:   07/10/13 1329  BP: 130/86  Pulse: 95  Temp: 97.1 F (36.2 C)   Physical Exam General: alert, cooperative, NAD HEENT: NCAT, vision grossly intact Neck: supple Lungs: clear to ascultation bilaterally Heart: regular rate and rhythm Abdomen: soft, non-tender, non-distended, normal bowel sounds Extremities: warm, well perfused; no pedal edema; onycholysis to bilateral great toes (R>L), no redness, discharge or swelling to bilateral great toes Neurologic: alert & oriented X3, cranial nerves II-XII grossly intact, gait normal  Current Outpatient Prescriptions on File Prior to Visit  Medication Sig Dispense Refill  . albuterol (PROVENTIL HFA;VENTOLIN HFA) 108 (90 BASE) MCG/ACT inhaler Inhale 2 puffs into the lungs every 6 (six) hours as needed.  1 Inhaler  2  . CALCIUM PO Take 1 tablet by mouth daily.      . Cyanocobalamin (VITAMIN B-12 PO) Take 1 capsule by mouth daily.      . fish oil-omega-3 fatty acids 1000 MG capsule Take by mouth daily.      [name] FLUoxetine (PROZAC) 20 MG capsule Take 20 mg by mouth daily. Prescribed by Dr Jac Canavan      . hydrOXYzine (ATARAX/VISTARIL) 25 MG tablet Take 25 mg by mouth 2 (two) times daily as needed. Prescribed by Dr Einar Grad      . Multiple Vitamins-Minerals (HAIR/SKIN/NAILS PO) Take 1  tablet by mouth daily.      . traZODone (DESYREL) 100 MG tablet Take 100 mg by mouth at bedtime. Prescribed Dr Jac Canavan       No current facility-administered medications on file prior to visit.    Assessment/Plan

## 2013-07-11 LAB — VITAMIN D 25 HYDROXY (VIT D DEFICIENCY, FRACTURES): VIT D 25 HYDROXY: 51 ng/mL (ref 30–89)

## 2013-08-17 ENCOUNTER — Telehealth: Payer: Self-pay

## 2013-08-18 NOTE — Telephone Encounter (Signed)
Entered in error

## 2013-08-28 ENCOUNTER — Encounter: Payer: Medicaid Other | Admitting: Internal Medicine

## 2013-10-13 ENCOUNTER — Ambulatory Visit (INDEPENDENT_AMBULATORY_CARE_PROVIDER_SITE_OTHER): Payer: Medicaid Other | Admitting: Internal Medicine

## 2013-10-13 ENCOUNTER — Encounter: Payer: Self-pay | Admitting: Internal Medicine

## 2013-10-13 VITALS — BP 109/71 | HR 69 | Temp 98.2°F | Ht 62.0 in | Wt 184.1 lb

## 2013-10-13 DIAGNOSIS — K219 Gastro-esophageal reflux disease without esophagitis: Secondary | ICD-10-CM

## 2013-10-13 DIAGNOSIS — Z720 Tobacco use: Secondary | ICD-10-CM

## 2013-10-13 DIAGNOSIS — F17200 Nicotine dependence, unspecified, uncomplicated: Secondary | ICD-10-CM

## 2013-10-13 DIAGNOSIS — Z83511 Family history of glaucoma: Secondary | ICD-10-CM | POA: Insufficient documentation

## 2013-10-13 DIAGNOSIS — F3289 Other specified depressive episodes: Secondary | ICD-10-CM

## 2013-10-13 DIAGNOSIS — F329 Major depressive disorder, single episode, unspecified: Secondary | ICD-10-CM

## 2013-10-13 DIAGNOSIS — M25561 Pain in right knee: Secondary | ICD-10-CM

## 2013-10-13 DIAGNOSIS — F1021 Alcohol dependence, in remission: Secondary | ICD-10-CM

## 2013-10-13 DIAGNOSIS — Z0000 Encounter for general adult medical examination without abnormal findings: Secondary | ICD-10-CM

## 2013-10-13 DIAGNOSIS — M25569 Pain in unspecified knee: Secondary | ICD-10-CM

## 2013-10-13 DIAGNOSIS — J45909 Unspecified asthma, uncomplicated: Secondary | ICD-10-CM

## 2013-10-13 DIAGNOSIS — M25562 Pain in left knee: Secondary | ICD-10-CM

## 2013-10-13 DIAGNOSIS — J4520 Mild intermittent asthma, uncomplicated: Secondary | ICD-10-CM

## 2013-10-13 LAB — CBC
HCT: 36.1 % (ref 36.0–46.0)
Hemoglobin: 12.2 g/dL (ref 12.0–15.0)
MCH: 28.8 pg (ref 26.0–34.0)
MCHC: 33.8 g/dL (ref 30.0–36.0)
MCV: 85.3 fL (ref 78.0–100.0)
PLATELETS: 404 10*3/uL — AB (ref 150–400)
RBC: 4.23 MIL/uL (ref 3.87–5.11)
RDW: 14.4 % (ref 11.5–15.5)
WBC: 10 10*3/uL (ref 4.0–10.5)

## 2013-10-13 MED ORDER — OMEPRAZOLE 20 MG PO CPDR: 20.0000 mg | DELAYED_RELEASE_CAPSULE | Freq: Two times a day (BID) | ORAL | Status: DC

## 2013-10-13 MED ORDER — ALBUTEROL SULFATE HFA 108 (90 BASE) MCG/ACT IN AERS: 2.0000 | INHALATION_SPRAY | Freq: Four times a day (QID) | RESPIRATORY_TRACT | Status: DC | PRN

## 2013-10-13 NOTE — Patient Instructions (Addendum)
For smoking, please continue using the patches, and we will revisit at your next appointment.  I have refilled your prescriptions for Prilosec and your albuterol inhaler.   Keep up the good work; thank you!

## 2013-10-13 NOTE — Assessment & Plan Note (Signed)
Assessment -[facility] and gets infrequent injections in her knees and shoulders; it's been 1-2 years since she has received them.  -She plans to start exercise class tomorrow.  Plan -Encouraged patient to follow-up with exercise

## 2013-10-13 NOTE — Assessment & Plan Note (Signed)
Assessment Despite her recent stressors, she expresses a positive outlook and has since stopped her psychotropes. She denies SI.  Plan -Stable from last visit; continue reassessing

## 2013-10-13 NOTE — Assessment & Plan Note (Signed)
Ordered referral to ophtho today

## 2013-10-13 NOTE — Assessment & Plan Note (Signed)
Rechecked labs today as it's been at least year for CMET, A1c, TSH, CBC

## 2013-10-13 NOTE — Assessment & Plan Note (Signed)
  Assessment: Progress toward smoking cessation:  smoking the same amount Barriers to progress toward smoking cessation:  stress Comments:   She was last seen by Dr. Mechele Claude on 5/4 at which point she had completed 24-week therapy of Chantix. She was started on a 14mg  patch. She picked up a new box today. She is now smoking 1/2-1ppd since her mother's death.  Plan: Instruction/counseling given:  I counseled patient on the dangers of tobacco use, advised patient to stop smoking, and reviewed strategies to maximize success. Educational resources provided:    Self management tools provided:    Medications to assist with smoking cessation:  Nicotine Patch Patient agreed to the following self-care plans for smoking cessation:    Other plans:  -Continue 14mg  patch -Call in 4 weeks to see how she is doing -Consider 7mg  patch at next visit

## 2013-10-13 NOTE — Progress Notes (Signed)
   Subjective:    Patient ID: Tabitha Wilcox, female    DOB: 1955/03/15, 58 y.o.   MRN: [id number]  HPI Tabitha Wilcox is a 58 year old female with a history of asthma, EtOH abuse, tobacco abuse, depression, bilateral knee pain presents to clinic for a follow-up visit.  She missed her appointment in June because she had to take her mom to the hospital. She was diagnosed with cancer (small cell with mets all over her body) and ended up passing sadly. Then, she found out her brother was diagnosed with liver cancer. She plans to start an exercise class tomorrow morning.   Please see assessment & plan for documentation of her problems.  Review of Systems  Constitutional: Negative for fever, appetite change and fatigue.  Respiratory: Negative for shortness of breath.   Cardiovascular: Negative for chest pain.  Gastrointestinal: Negative for abdominal pain.  Musculoskeletal: Positive for arthralgias.  Psychiatric/Behavioral: Negative for suicidal ideas and self-injury.       Objective:   Physical Exam  Vitals reviewed. General: resting in bed, NAD HEENT: PERRL, EOMI, no scleral icterus Cardiac: RRR, no rubs, murmurs or gallops Pulm: Diminished breath sounds bilaterally. Abd: soft, nontender, nondistended, BS present Ext: warm and well perfused, no pedal edema Neuro: alert and oriented X3, cranial nerves II-XII grossly intact, strength and sensation to light touch equal in bilateral upper and lower extremities       Assessment & Plan:

## 2013-10-13 NOTE — Assessment & Plan Note (Signed)
Assessment She drinks wine coolers infrequently and had one yesterday for her birthday.  Plan -Stable from last visit; continue reassessing

## 2013-10-13 NOTE — Assessment & Plan Note (Signed)
Assessment She does not use her inhaler every day and feels fine otherwise.   Plan -Stable from last visit; continue reassessing -Encouraged her to reduce smoking as well for better symptom management

## 2013-10-14 LAB — COMPREHENSIVE METABOLIC PANEL
ALT: 24 U/L (ref 0–35)
AST: 20 U/L (ref 0–37)
Albumin: 4.4 g/dL (ref 3.5–5.2)
Alkaline Phosphatase: 85 U/L (ref 39–117)
BUN: 11 mg/dL (ref 6–23)
CALCIUM: 9.8 mg/dL (ref 8.4–10.5)
CHLORIDE: 106 meq/L (ref 96–112)
CO2: 25 mEq/L (ref 19–32)
CREATININE: 0.86 mg/dL (ref 0.50–1.10)
Glucose, Bld: 136 mg/dL — ABNORMAL HIGH (ref 70–99)
POTASSIUM: 4.4 meq/L (ref 3.5–5.3)
SODIUM: 142 meq/L (ref 135–145)
TOTAL PROTEIN: 6.9 g/dL (ref 6.0–8.3)
Total Bilirubin: 0.3 mg/dL (ref 0.2–1.2)

## 2013-10-14 LAB — HEMOGLOBIN A1C
Hgb A1c MFr Bld: 6.1 % — ABNORMAL HIGH (ref <5.7)
Mean Plasma Glucose: 128 mg/dL — ABNORMAL HIGH (ref <117)

## 2013-10-14 LAB — TSH: TSH: 0.419 u[IU]/mL (ref 0.350–4.500)

## 2013-10-16 NOTE — Progress Notes (Signed)
Attending physician note: I personally interviewed and examined this patient together with resident physician Charlott Rakes on the day of the patient's visit and I concur with his assessment and management plan. Murriel Hopper M.D. FACP

## 2013-10-25 ENCOUNTER — Telehealth: Payer: Self-pay | Admitting: Internal Medicine

## 2013-10-25 NOTE — Telephone Encounter (Signed)
I called her today to inform her the results of her bloodwork. The only recommendation I had was for her to continue watching her diet and exercise as her A1c was 6.1.   I asked about her exercise class, but she never got around to starting it as her brother passed away this past weekend. I expressed my condolences to her and told her that he was in a better place to which she agreed.   I also asked about her smoking. She noted she hadn't really started the patches but will get back on it.  She appreciated my call and checking in and will watch what she eats while fitting in exercise.

## 2013-10-30 ENCOUNTER — Ambulatory Visit (INDEPENDENT_AMBULATORY_CARE_PROVIDER_SITE_OTHER): Payer: Medicaid Other | Admitting: Family Medicine

## 2013-10-30 ENCOUNTER — Encounter: Payer: Self-pay | Admitting: Family Medicine

## 2013-10-30 VITALS — BP 110/79 | Ht 62.0 in | Wt 181.0 lb

## 2013-10-30 DIAGNOSIS — M25561 Pain in right knee: Secondary | ICD-10-CM

## 2013-10-30 DIAGNOSIS — M25569 Pain in unspecified knee: Secondary | ICD-10-CM

## 2013-10-30 DIAGNOSIS — M25562 Pain in left knee: Principal | ICD-10-CM

## 2013-10-30 MED ORDER — METHYLPREDNISOLONE ACETATE 40 MG/ML IJ SUSP
40.0000 mg | Freq: Once | INTRAMUSCULAR | Status: AC
  Administered 2013-10-30: 40 mg via INTRA_ARTICULAR

## 2013-10-30 MED ORDER — DICLOFENAC SODIUM 1 % TD GEL: TRANSDERMAL | Status: DC

## 2013-10-30 NOTE — Progress Notes (Signed)
Patient ID: Tabitha Wilcox, female   DOB: Dec 13, 1955, 58 y.o.   MRN: [id number]  Tabitha Wilcox - 58 y.o. female MRN [id number]  Date of birth: 1955-06-22    SUBJECTIVE:     Bilateral L>R knee pain last 2 months. Has started walking 1 1/2 hours a day to improve her health, lose weight, improve DM.   IMAGING: A lateral standing knee films from 2013 showed moderate joint space loss bilaterally, medial compartment worse than lateral. ROS:     Pertinent review of systems: negative for fever or unusual weight change. She has lost 6 pounds last 1 m but it was intentional. No knee swelling or redness.   PERTINENT  PMH / PSH FH / / SH:  Past Medical, Surgical, Social, and Family History Reviewed & Updated in the EMR.  Pertinent findings include:  DM Hx R rotator cuff surgery with significant improvement in her shoulder pain (Dr. supple). Brother recently died from unknown type cancer and cirrhosis. Mom also died recently from brain tumor. Personal history of heavy alcohol use currently cutting way back  OBJECTIVE: BP 110/79  Ht 5\' 2"  (1.575 m)  Wt 181 lb (82.101 kg)  BMI 33.10 kg/m2  Physical Exam:  Vital signs are reviewed. GENERAL: Well-developed female no acute distress slightly overweight KNEES: Full range of motion flexion and extension. Mild crepitus bilaterally and extension. Ligaments intact to varus and valgus stress. Normal Lockman. Negative McMurray. There is no effusion erythema or warmth. Popliteal space is benign. Calf is soft. Distally neurovascularly intact. INJECTION: Patient was given informed consent, signed copy in the chart. Appropriate time out was taken. Area prepped and draped in usual sterile fashion. One cc of methylprednisolone 40 mg/ml plus  4 cc of 1% lidocaine without epinephrine was injected into the bilateral knee using a(n) anterior medial approach. The patient tolerated the procedure well. There were no complications. Post procedure instructions were  given.   ASSESSMENT & PLAN:  See problem based charting & AVS for pt instructions.

## 2013-11-01 ENCOUNTER — Other Ambulatory Visit: Payer: Self-pay | Admitting: *Deleted

## 2013-11-03 MED ORDER — NICOTINE 14 MG/24HR TD PT24: 14.0000 mg | MEDICATED_PATCH | TRANSDERMAL | Status: DC

## 2013-11-03 NOTE — Telephone Encounter (Signed)
As I saw this patient recently and feel it's necessary for the treatment of their disease, I will refill this prescription for  3 months.

## 2013-11-20 ENCOUNTER — Encounter: Payer: Self-pay | Admitting: *Deleted

## 2013-12-18 ENCOUNTER — Other Ambulatory Visit: Payer: Self-pay | Admitting: *Deleted

## 2013-12-18 MED ORDER — MELOXICAM 15 MG PO TABS: 15.0000 mg | ORAL_TABLET | Freq: Every day | ORAL | Status: DC

## 2013-12-22 ENCOUNTER — Other Ambulatory Visit: Payer: Self-pay

## 2013-12-22 ENCOUNTER — Encounter: Payer: Self-pay | Admitting: Internal Medicine

## 2013-12-22 ENCOUNTER — Encounter: Payer: Medicaid Other | Admitting: Internal Medicine

## 2013-12-28 ENCOUNTER — Ambulatory Visit (INDEPENDENT_AMBULATORY_CARE_PROVIDER_SITE_OTHER): Payer: Medicare Other | Admitting: Internal Medicine

## 2013-12-28 ENCOUNTER — Encounter: Payer: Self-pay | Admitting: Internal Medicine

## 2013-12-28 VITALS — BP 121/72 | HR 99 | Temp 98.0°F | Ht 62.0 in | Wt 181.0 lb

## 2013-12-28 DIAGNOSIS — Z299 Encounter for prophylactic measures, unspecified: Secondary | ICD-10-CM

## 2013-12-28 DIAGNOSIS — Z418 Encounter for other procedures for purposes other than remedying health state: Secondary | ICD-10-CM | POA: Diagnosis present

## 2013-12-28 DIAGNOSIS — M5441 Lumbago with sciatica, right side: Secondary | ICD-10-CM

## 2013-12-28 DIAGNOSIS — Z720 Tobacco use: Secondary | ICD-10-CM

## 2013-12-28 DIAGNOSIS — Z23 Encounter for immunization: Secondary | ICD-10-CM | POA: Diagnosis present

## 2013-12-28 MED ORDER — ACETAMINOPHEN 325 MG PO TABS: 650.0000 mg | ORAL_TABLET | ORAL | Status: DC | PRN

## 2013-12-28 NOTE — Patient Instructions (Signed)
Thank you for your visit today.   Please return to the internal medicine clinic as needed.   I have sent you some tylenol to your pharmacy.  Please do not exceed 4000mg /day of tylenol. I advise you to use a heating pad and warm bath. Also continue to take mobic daily. Please contact sports medicine.  I would not image your hip on your left side today.  Please return in 4 weeks if your pain is not better.     Your current medical regimen is effective;  continue present plan and take all medications as prescribed.    Please be sure to bring all of your medications with you to every visit; this includes herbal supplements, vitamins, eye drops, and any over-the-counter medications.   Should you have any questions regarding your medications and/or any new or worsening symptoms, please be sure to call the clinic at [phone].   If you believe that you are suffering from a life threatening condition or one that may result in the loss of limb or function, then you should call 911 or proceed to the nearest Emergency Department.     A healthy lifestyle and preventative care can promote health and wellness.   Maintain regular health, dental, and eye exams.  Eat a healthy diet. Foods like vegetables, fruits, whole grains, low-fat dairy products, and lean protein foods contain the nutrients you need without too many calories. Decrease your intake of foods high in solid fats, added sugars, and salt. Get information about a proper diet from your caregiver, if necessary.  Regular physical exercise is one of the most important things you can do for your health. Most adults should get at least 150 minutes of moderate-intensity exercise (any activity that increases your heart rate and causes you to sweat) each week. In addition, most adults need muscle-strengthening exercises on 2 or more days a week.   Maintain a healthy weight. The body mass index (BMI) is a screening tool to identify possible  weight problems. It provides an estimate of body fat based on height and weight. Your caregiver can help determine your BMI, and can help you achieve or maintain a healthy weight. For adults 20 years and older:  A BMI below 18.5 is considered underweight.  A BMI of 18.5 to 24.9 is normal.  A BMI of 25 to 29.9 is considered overweight.  A BMI of 30 and above is considered obese.

## 2013-12-28 NOTE — Progress Notes (Signed)
Patient ID: ARABEL BARCENAS, female   DOB: Jul 14, 1955, 58 y.o.MRN: [id number]    Subjective:   Patient ID: PATTE WINKEL female    DOB: December 20, 1955 58 y.o.    MRN: [id number] Health Maintenance Due: There are no preventive care reminders to display for this patient.  _________________________________________________  HPI: Ms.Gentry CHAYLEE EHRSAM is a 58 y.o. female here for an acute visit.  Pt has a PMH outlined below.  Please see problem-based charting assessment and plan note for further details of medical issues addressed at today's visit.  PMH: Past Medical History  Diagnosis Date  . Asthma   . Tobacco abuse   . Depression   . GERD (gastroesophageal reflux disease)   . Alcoholism   . Anxiety   . Arthritis   . Carpal tunnel syndrome on both sides   . Rotator cuff injury     Right side  . Sciatica   . Ulcer     1990's  . Shortness of breath     Medications: Current Outpatient Prescriptions on File Prior to Visit  Medication Sig Dispense Refill  . albuterol (PROVENTIL HFA;VENTOLIN HFA) 108 (90 BASE) MCG/ACT inhaler Inhale 2 puffs into the lungs every 6 (six) hours as needed.  1 Inhaler  3  . CALCIUM PO Take 1 tablet by mouth daily.      . Cyanocobalamin (VITAMIN B-12 PO) Take 1 capsule by mouth daily.      . diclofenac sodium (VOLTAREN) 1 % GEL Apply 2-4 times daily too  ilateral knees as needed for OA pain  100 g  12  . fish oil-omega-3 fatty acids 1000 MG capsule Take by mouth daily.      . hydrOXYzine (ATARAX/VISTARIL) 25 MG tablet Take 25 mg by mouth 2 (two) times daily as needed. Prescribed by Dr Einar Grad      . meloxicam (MOBIC) 15 MG tablet Take 1 tablet (15 mg total) by mouth daily.  30 tablet  0  . Multiple Vitamins-Minerals (HAIR/SKIN/NAILS PO) Take 1 tablet by mouth daily.      . nicotine (NICODERM CQ - DOSED IN MG/24 HOURS) 14 mg/24hr patch Place 1 patch (14 mg total) onto the skin daily.  30 patch  2  . omeprazole (PRILOSEC) 20 MG capsule Take 1 capsule (20 mg total)  by mouth 2 (two) times daily before a meal.  60 capsule  3  . QUEtiapine (SEROQUEL) 100 MG tablet Take 100 mg by mouth at bedtime.      . traZODone (DESYREL) 100 MG tablet Take 100 mg by mouth at bedtime as needed for sleep. Prescribed Dr Jac Canavan       No current facility-administered medications on file prior to visit.    Allergies: No Known Allergies  FH: Family History  Problem Relation Age of Onset  . Colon cancer Maternal Aunt   . Colon cancer Paternal Uncle     SH: History   Social History  . Marital Status: Divorced    Spouse Name: N/A    Number of Children: 3  . Years of Education: N/A   Occupational History  . Unemployed     Social History Main Topics  . Smoking status: Current Every Day Smoker -- 0.30 packs/day for 25 years    Types: Cigarettes  . Smokeless tobacco: Never Used     Comment: cuting back. On Chantix.  [name] Alcohol Use: No     Comment: Stopped alcohol since Thursday.  . Drug Use: No  Comment: stopped since 2 1/2 years. Before using Marijuana.   . Sexual Activity: None   Other Topics Concern  . None   Social History Narrative   Occupation: Worked in Designer, industrial/product.  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       Daughter, 74   Son, 57   Son, 61 (Deceased, shot at fathers home in 2007)   Does not exercise anymore due to foot/sciatica pain          Review of Systems: Constitutional: Negative for fever, chills and weight loss.  Eyes: Negative for blurred vision.  Respiratory: Negative for cough and shortness of breath.  Cardiovascular: Negative for chest pain, palpitations and leg swelling.  Gastrointestinal: Negative for nausea, vomiting, abdominal pain, diarrhea, constipation and blood in stool.  Genitourinary: Negative for dysuria, urgency and frequency.  Musculoskeletal: Negative for myalgias and +back pain.  Neurological:  Negative for dizziness, weakness and headaches.     Objective:   Vital Signs: Filed Vitals:   12/28/13 1000  BP: 121/72  Pulse: 99  Temp: 98 F (36.7 C)  TempSrc: Oral  Height: 5\' 2"  (1.575 m)  Weight: 181 lb (82.101 kg)  SpO2: 100%     BP Readings from Last 3 Encounters:  12/28/13 121/72  10/30/13 110/79  10/13/13 109/71    Physical Exam: Constitutional: Vital signs reviewed.  Patient is well-developed and well-nourished in NAD and cooperative with exam.  Head: Normocephalic and atraumatic. Eyes: PERRL, EOMI, conjunctivae nl, no scleral icterus.  Neck: Supple. Cardiovascular: RRR, no MRG. Pulmonary/Chest: normal effort, non-tender to palpation, CTAB, no wheezes, rales, or rhonchi. Abdominal: Soft. NT/ND +BS. Musculoskeletal: Full range ofmotion of hips bilaterally, without edema,erythema, or deformity, left groin area tender to palpation.   Nocyanosis,clubbing,oredema. Neurological: A&O x3, cranial nerves II-XII are grossly intact, moving all extremities. Extremities: 2+DP b/l; no pitting edema. Skin: Warm, dry and intact. No rash.  Most Recent Laboratory Results:  CMP     Component Value Date/Time   NA 142 10/13/2013 1507   K 4.4 10/13/2013 1507   CL 106 10/13/2013 1507   CO2 25 10/13/2013 1507   GLUCOSE 136* 10/13/2013 1507   BUN 11 10/13/2013 1507   CREATININE 0.86 10/13/2013 1507   CREATININE 0.70 07/05/2012 0919   CALCIUM 9.8 10/13/2013 1507   PROT 6.9 10/13/2013 1507   ALBUMIN 4.4 10/13/2013 1507   AST 20 10/13/2013 1507   ALT 24 10/13/2013 1507   ALKPHOS 85 10/13/2013 1507   BILITOT 0.3 10/13/2013 1507   GFRNONAA >89 03/07/2012 1618   GFRAA >89 03/07/2012 1618    CBC    Component Value Date/Time   WBC 10.0 10/13/2013 1507   RBC 4.23 10/13/2013 1507   HGB 12.2 10/13/2013 1507   HCT 36.1 10/13/2013 1507   PLT 404* 10/13/2013 1507   MCV 85.3 10/13/2013 1507   MCH 28.8 10/13/2013 1507   MCHC 33.8 10/13/2013 1507   RDW 14.4 10/13/2013 1507   LYMPHSABS 1.2 07/05/2012 0900   MONOABS  0.9 07/05/2012 0900   EOSABS 0.0 07/05/2012 0900   BASOSABS 0.0 07/05/2012 0900    Lipid Panel Lab Results  Component Value Date   CHOL 208* 12/26/2010   HDL 71 12/26/2010   LDLCALC 116* 12/26/2010   TRIG 105 12/26/2010   CHOLHDL 2.9 12/26/2010    HA1C Lab Results  Component Value Date   HGBA1C 6.1* 10/13/2013  Urinalysis    Component Value Date/Time   COLORURINE YELLOW 12/30/2008 1758   APPEARANCEUR CLEAR 12/30/2008 1758   LABSPEC 1.011 12/30/2008 1758   PHURINE 6.0 12/30/2008 1758   GLUCOSEU NEGATIVE 12/30/2008 1758   HGBUR NEGATIVE 12/30/2008 1758   BILIRUBINUR NEGATIVE 12/30/2008 1758   KETONESUR NEGATIVE 12/30/2008 1758   PROTEINUR NEGATIVE 12/30/2008 1758   UROBILINOGEN 0.2 12/30/2008 1758   NITRITE NEGATIVE 12/30/2008 1758   LEUKOCYTESUR NEGATIVE MICROSCOPIC NOT DONE ON URINES WITH NEGATIVE PROTEIN, BLOOD, LEUKOCYTES, NITRITE, OR GLUCOSE <1000 mg/dL. 12/30/2008 1758    Urine Microalbumin No results found for this basename: MICROALBUR,  [id number]    Imaging N/A   Assessment & Plan:   Assessment and plan was discussed and formulated with my attending.

## 2013-12-31 NOTE — Assessment & Plan Note (Signed)
-  influenza vaccine given today -prescribed nicotine patches and chantix as pt is interested in smoking cessation

## 2013-12-31 NOTE — Assessment & Plan Note (Signed)
Pt is interested in smoking cessation and would like to try something.  We discussed the synergistic effects of nicotine patches and chantix. -sent prescription to pharmacy for chantix and nicotine patches -follow up with PCP

## 2013-12-31 NOTE — Assessment & Plan Note (Addendum)
Pt states she has been experiencing left hip pain in addition to right back pain with radiculopathy that is chronic.  She reports the pain began about 3-4 weeks ago.  She cannot recall what she was doing when the pain began but describes the pain as worse when lying down.  She denies any difficulty with gait, ambulation, or weight bearing.  She describes the pain as mainly in the groin area and aching without radiation.  Denies any fever/chills, N/V, weight loss, or history of malignancy.  She does have a h/o OA of the knees and is followed by [facility] (Dr. Nori Riis) and has gotten injections in her knees with some relief.  On exam, she has good ROM of the hips bilaterally, but the pain is worse when she lies flat and is somewhat tender to palpation.  No lateral hip tenderness to palpation.  She has tried meloxicam which she has been taking prescribed by Dr. Nori Riis which helps somewhat.  Possibilities include OA, sprain, trochanteric bursitis (unlikely given no lateral hip pain).   -would defer XR of the hip for now considering no red flags and consider if not better in 4 weeks -advised her to try tylenol up to 4000mg  daily for pain and she may continue mobic -advised to try heating pad and warm baths -advised her that it may be helpful to follow-up with Dr. Nori Riis also -

## 2014-01-01 NOTE — Progress Notes (Signed)
Internal Medicine Clinic Attending  Case discussed with Dr. Gill soon after the resident saw the patient.  We reviewed the resident's history and exam and pertinent patient test results.  I agree with the assessment, diagnosis, and plan of care documented in the resident's note.  

## 2014-01-05 ENCOUNTER — Other Ambulatory Visit: Payer: Self-pay | Admitting: Internal Medicine

## 2014-01-07 NOTE — Telephone Encounter (Signed)
As this patient was seen by Dr. Gordy Levan on 10/22 and deemed necessary for their treatment as documented in the EHR, I will refill this prescription for Prilosec and nicotine patches. I will also have her come in and see me to consider adjusting patch therapy dosing.

## 2014-01-22 ENCOUNTER — Other Ambulatory Visit: Payer: Self-pay | Admitting: Internal Medicine

## 2014-01-22 NOTE — Telephone Encounter (Signed)
As this patient was seen by Dr. Gordy Levan on 10/22 and deemed necessary for their treatment as documented in the EHR, I will refill this prescription for albuterol inhaler.

## 2014-01-26 ENCOUNTER — Ambulatory Visit
Admission: RE | Admit: 2014-01-26 | Discharge: 2014-01-26 | Disposition: A | Discharge status: Home / Self Care | Payer: Medicaid Other | Source: Ambulatory Visit | Attending: Family Medicine | Admitting: Family Medicine

## 2014-01-26 ENCOUNTER — Ambulatory Visit (INDEPENDENT_AMBULATORY_CARE_PROVIDER_SITE_OTHER): Payer: Medicare Other | Admitting: Family Medicine

## 2014-01-26 ENCOUNTER — Encounter: Payer: Self-pay | Admitting: Family Medicine

## 2014-01-26 VITALS — BP 130/82 | HR 103 | Ht 62.0 in | Wt 180.0 lb

## 2014-01-26 DIAGNOSIS — M25551 Pain in right hip: Secondary | ICD-10-CM

## 2014-01-26 DIAGNOSIS — M25552 Pain in left hip: Secondary | ICD-10-CM

## 2014-01-26 DIAGNOSIS — M5441 Lumbago with sciatica, right side: Secondary | ICD-10-CM | POA: Diagnosis present

## 2014-01-26 DIAGNOSIS — M160 Bilateral primary osteoarthritis of hip: Secondary | ICD-10-CM | POA: Diagnosis not present

## 2014-01-26 MED ORDER — MELOXICAM 15 MG PO TABS: 15.0000 mg | ORAL_TABLET | Freq: Every day | ORAL | Status: DC

## 2014-01-26 NOTE — Patient Instructions (Signed)
PRIOR AUTH NUMBER IS [id number]

## 2014-01-30 NOTE — Progress Notes (Signed)
   Subjective:    Patient ID: Tabitha Wilcox, female    DOB: Jun 08, 1955, 58 y.o.   MRN: [id number]  HPI  Episode of intense sciatic pain for several days--resolved on its own. Radiated from buttock down posterior portion right thigh to just below knee---did not radiate to foot. No weakness. Pain was 9/10 for several days. Used OTC and heat and it has mostly resolved. Was unable to sleep much for those days. No known inciting injury. HAS had this typeof issue before--many years ago. Is concerned because she was told at one point she had ruptured disc. Is in recovery from narcotics and does not want any type of pain medicine. Does want to further investigate in case this comes back--she wants a plan.  Review of Systems Pertinent review of systems: negative for fever or unusual weight change. No urinary or fecal incontinence.     Objective:   Physical Exam  Vital signs reviewed. GENERAL: Well developed, well nourished, no acute distress BACK: no deformity. nontender palpation. SLR positive on right at 90 degrees in seated and at 60-70 degrees seated. Lower extremity strength 5/5/ B = DTRs 2+ B= knee and ankle.      Assessment & Plan:

## 2014-01-30 NOTE — Assessment & Plan Note (Signed)
Given all of the issues and the severity of this episode I thinkwe need to proceed with imaging--set up MRI.

## 2014-02-03 ENCOUNTER — Other Ambulatory Visit: Payer: Medicaid Other

## 2014-02-05 ENCOUNTER — Ambulatory Visit
Admission: RE | Admit: 2014-02-05 | Discharge: 2014-02-05 | Disposition: A | Discharge status: Home / Self Care | Payer: Medicaid Other | Source: Ambulatory Visit | Attending: Family Medicine | Admitting: Family Medicine

## 2014-02-05 DIAGNOSIS — M47816 Spondylosis without myelopathy or radiculopathy, lumbar region: Secondary | ICD-10-CM | POA: Diagnosis not present

## 2014-02-05 DIAGNOSIS — M4806 Spinal stenosis, lumbar region: Secondary | ICD-10-CM | POA: Diagnosis not present

## 2014-02-05 DIAGNOSIS — M5441 Lumbago with sciatica, right side: Secondary | ICD-10-CM

## 2014-02-09 ENCOUNTER — Telehealth: Payer: Self-pay | Admitting: Family Medicine

## 2014-02-09 NOTE — Telephone Encounter (Signed)
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 She is doing much better now. It had been several years since a previous episode. As long as she continues to do well I would recommend we do nothing. If she has another acute episode of significance or starts to have recurrent episodes I would recommend  EPIDURAL STEROID INJECTION---she can call office if she has issues and we would be able to set up epidural steroid injections. Tabitha Wilcox

## 2014-03-05 ENCOUNTER — Other Ambulatory Visit: Payer: Self-pay | Admitting: *Deleted

## 2014-03-05 ENCOUNTER — Other Ambulatory Visit: Payer: Self-pay | Admitting: Family Medicine

## 2014-03-05 DIAGNOSIS — M5431 Sciatica, right side: Secondary | ICD-10-CM

## 2014-03-12 ENCOUNTER — Ambulatory Visit
Admission: RE | Admit: 2014-03-12 | Discharge: 2014-03-12 | Disposition: A | Discharge status: Home / Self Care | Payer: Medicaid Other | Source: Ambulatory Visit | Attending: Family Medicine | Admitting: Family Medicine

## 2014-03-12 DIAGNOSIS — M5431 Sciatica, right side: Secondary | ICD-10-CM

## 2014-03-12 DIAGNOSIS — M545 Low back pain: Secondary | ICD-10-CM | POA: Diagnosis not present

## 2014-03-12 MED ORDER — IOHEXOL 180 MG/ML  SOLN
1.0000 mL | Freq: Once | INTRAMUSCULAR | Status: AC | PRN
  Administered 2014-03-12: 1 mL via EPIDURAL

## 2014-03-12 MED ORDER — METHYLPREDNISOLONE ACETATE 40 MG/ML INJ SUSP (RADIOLOG
120.0000 mg | Freq: Once | INTRAMUSCULAR | Status: AC
  Administered 2014-03-12: 120 mg via EPIDURAL

## 2014-03-12 NOTE — Discharge Instructions (Signed)

## 2014-03-13 ENCOUNTER — Telehealth: Payer: Self-pay | Admitting: *Deleted

## 2014-03-13 NOTE — Telephone Encounter (Signed)
Left msg for pt regarding making appt at [facility]

## 2014-03-13 NOTE — Telephone Encounter (Signed)
-----   Message from Babette Relic sent at 03/12/2014  5:19 PM EST ----- Regarding: FW: Question Referral? Contact: [phone] Please call patient and let her know that she should call [facility] for an appointment since they have her MRI showing the neck hernation. ----- Message -----    From: Dickie La, MD    Sent: 03/12/2014   4:47 PM      To: Babette Relic Subject: RE: Question Referral?                         No I cannot see any neck MRI from [facility] and yes she should probably see [facility] for this if they are the ones who saw her initially [facility]! Dorcas Mcmurray  ----- Message -----    From: Babette Relic    Sent: 03/12/2014   2:29 PM      To: Dickie La, MD Subject: Question Referral?                             Herniated disc in neck dx in fall of 2014 - now having pain (per [facility]).  Pain in neck and head and down arm.  Wanted to know if you can look at her MRI from [facility] or if she needs another one.  Should she go back to [facility].

## 2014-03-16 ENCOUNTER — Encounter: Payer: Self-pay | Admitting: Internal Medicine

## 2014-03-16 ENCOUNTER — Ambulatory Visit (INDEPENDENT_AMBULATORY_CARE_PROVIDER_SITE_OTHER): Payer: Medicare Other | Admitting: Internal Medicine

## 2014-03-16 VITALS — BP 112/76 | HR 95 | Temp 98.4°F | Ht 62.0 in | Wt 182.2 lb

## 2014-03-16 DIAGNOSIS — B9789 Other viral agents as the cause of diseases classified elsewhere: Secondary | ICD-10-CM

## 2014-03-16 DIAGNOSIS — M5412 Radiculopathy, cervical region: Secondary | ICD-10-CM | POA: Diagnosis present

## 2014-03-16 DIAGNOSIS — J069 Acute upper respiratory infection, unspecified: Secondary | ICD-10-CM

## 2014-03-16 DIAGNOSIS — M47812 Spondylosis without myelopathy or radiculopathy, cervical region: Secondary | ICD-10-CM | POA: Insufficient documentation

## 2014-03-16 DIAGNOSIS — Z720 Tobacco use: Secondary | ICD-10-CM | POA: Diagnosis not present

## 2014-03-16 MED ORDER — GABAPENTIN 300 MG PO CAPS: 300.0000 mg | ORAL_CAPSULE | Freq: Three times a day (TID) | ORAL | Status: DC

## 2014-03-16 NOTE — Progress Notes (Deleted)
   Subjective:    Patient ID: Tabitha Wilcox, female    DOB: September 12, 1955, 59 y.o.   MRN: [id number]  HPI    Review of Systems     Objective:   Physical Exam        Assessment & Plan:

## 2014-03-16 NOTE — Progress Notes (Signed)
   Subjective:    Patient ID: Tabitha Wilcox, female    DOB: [birth year], 59 y.o.   MRN: [id number]  HPI Ms. Esquer is a 59 year old female with a history of asthma, EtOH abuse, tobacco abuse, depression, bilateral knee pain presents to clinic for a follow-up visit.  Please see assessment & plan for documentation of each problem.  Review of Systems  Constitutional: Positive for chills. Negative for fever.  HENT: Positive for rhinorrhea. Negative for sore throat.   Respiratory: Negative for cough.   Musculoskeletal: Positive for arthralgias.       Baseline  Neurological: Positive for dizziness and numbness.       Objective:   Physical Exam  Constitutional: She is oriented to person, place, and time. She appears well-developed and well-nourished. No distress.  HENT:  Head: Normocephalic and atraumatic.  Eyes: Conjunctivae are normal. Pupils are equal, round, and reactive to light.  Cardiovascular: Normal rate, regular rhythm and normal heart sounds.  Exam reveals no gallop and no friction rub.   No murmur heard. Pulmonary/Chest: Effort normal. No respiratory distress. She has no wheezes. She has no rales.  Abdominal: Soft. Bowel sounds are normal. She exhibits no distension. There is no tenderness.  Neurological: She is alert and oriented to person, place, and time. No cranial nerve deficit. Coordination normal. Numbness/tingling elicited of L 1ZG/0FV finger with neck flexion though limited by pain. Skin: Skin is warm and dry. She is not diaphoretic.  Psychiatric: Her behavior is normal.         Assessment & Plan:

## 2014-03-16 NOTE — Progress Notes (Signed)
Internal Medicine Clinic Attending  Case discussed with Dr. Patel at the time of the visit.  We reviewed the resident's history and exam and pertinent patient test results.  I agree with the assessment, diagnosis, and plan of care documented in the resident's note.  

## 2014-03-16 NOTE — Patient Instructions (Addendum)
General Instructions:   Please try to bring all your medicines next time. This will help [place] keep you safe from mistakes.   Please take gabapentin: 300mg  once today, 300mg  twice tomorrow, and three times thereafter.  I look forward to seeing you back next month!   Progress Toward Treatment Goals:  Treatment Goal 10/13/2013  Stop smoking smoking the same amount    Self Care Goals & Plans:  Self Care Goal 10/13/2013  Manage my medications -  Eat healthy foods -  Be physically active -  Stop smoking -  Other will start exercise  classes,walking everyday    No flowsheet data found.   Care Management & Community Referrals:  Referral 12/19/2012  Referrals made for care management support none needed

## 2014-03-19 NOTE — Assessment & Plan Note (Signed)
Overview -Reports symptoms x 5 days: chills, runny nose  -No fever, cough, sore throat -Around a lot of her grandkids lately  Assessment -Viral URI suspect given limited course of symptoms and improvement without antibiotics -Flu less likely as she reports getting vaccinated  Plan -Recommended symptomatic management

## 2014-03-19 NOTE — Assessment & Plan Note (Addendum)
Overview -Reports constant numbness/tingling of his L arm x 3 weeks -Was told at [facility] that she had a displaced disk per imaging done there -Cannot schedule appointment without referral from [place]  Assessment -Cervical radiculopathy likely given prior history and similar symptoms -Does not appear to be a new acute process  Plan -Refer to orthopedics for further management -Prescribe gabapentin 100mg ->100mg  BID->100mg  TID x 27 days and reassess in 30 days -Request records from orthopedist's office

## 2014-03-19 NOTE — Assessment & Plan Note (Signed)
Overview -Reports smoking the same amount due to stress from family deaths -Smokes a pack over 4 days -Never tried Chantix as it was never sent to the pharmacy from prior visit -Not interested in setting a quit date today though would like to quite  Assessment: Progress toward smoking cessation:  smoking the same amount Barriers to progress toward smoking cessation:  stress Comments:  -Contemplative about tobacco cessation  Plan: Instruction/counseling given:  I counseled patient on the dangers of tobacco use, advised patient to stop smoking, and reviewed strategies to maximize success. Educational resources provided:    Self management tools provided:    Medications to assist with smoking cessation:  Nicotine Patch, Chantix Patient agreed to the following self-care plans for smoking cessation: set a quit date and stop smoking  Other plans:  -Advised continuing nicotine patches for now -Plan to revisit in 1 month to start Chantix as she is not sure of a quite date

## 2014-04-20 DIAGNOSIS — M542 Cervicalgia: Secondary | ICD-10-CM | POA: Diagnosis not present

## 2014-04-25 ENCOUNTER — Telehealth: Payer: Self-pay | Admitting: *Deleted

## 2014-04-25 MED ORDER — VARENICLINE TARTRATE 0.5 MG X 11 & 1 MG X 42 PO MISC: ORAL | Status: DC

## 2014-04-25 NOTE — Telephone Encounter (Signed)
Pt called clinic - ready to try Chantix again to stop smoking. [facility] in [place]. Jannely Henthorn RN 2.17.16 3:55PM

## 2014-04-25 NOTE — Telephone Encounter (Signed)
This is promising. Can you have her make a follow-up appointment sometime in March? If she is open to do this, please also let her know that there are serious side effects that she should be careful about, including suicidal ideation and worsening depression. You can call in a prescription if she is okay with all of this as I cannot electronically prescribe it.

## 2014-04-30 MED ORDER — VARENICLINE TARTRATE 1 MG PO TABS: 1.0000 mg | ORAL_TABLET | Freq: Once | ORAL | Status: DC

## 2014-04-30 MED ORDER — VARENICLINE TARTRATE 0.5 MG PO TABS: ORAL_TABLET | ORAL | Status: DC

## 2014-04-30 NOTE — Telephone Encounter (Signed)
I was informed by RN Hilda Blades at the pharmacy could not fill this medication per the instructions I initially written so I am now re-prescribing this medication accordingly.

## 2014-04-30 NOTE — Telephone Encounter (Signed)
Pt aware of Dr Posey Pronto note about Chantix and pt will make appt 05/2014 for FU.

## 2014-04-30 NOTE — Addendum Note (Signed)
Addended by: Riccardo Dubin on: 04/30/2014 02:49 PM   Modules accepted: Orders

## 2014-05-09 ENCOUNTER — Encounter: Payer: Self-pay | Admitting: Internal Medicine

## 2014-05-09 NOTE — Progress Notes (Signed)
Patient ID: Tabitha Wilcox, female   DOB: 07-18-1955, 59 y.o.   MRN: [id number]  I have reviewed the office notes from Dr. Leticia Clas [Optometry]  for the office visit dated 03/14/14:  -Low risk for glaucoma though does have family history -Noted to have meibomianitis bilaterally of the upper and lower lids -Plan to return for visual field testing -Provided prescription for eyeglasses   I will reiterate these updates to the patient at their next scheduled visit.

## 2014-05-14 ENCOUNTER — Encounter: Payer: Self-pay | Admitting: Internal Medicine

## 2014-05-14 NOTE — Progress Notes (Signed)
Patient ID: Tabitha Wilcox, female   DOB: 1955-08-03, 59 y.o.   MRN: [id number]  I have reviewed the office notes from Dr. [facility]]  for the office visit dated 04/20/14 that was received on 05/08/14:  -Referred for worsening neck pain by me from last office visit -Pain with extension as well as left lateral flexion noted on physical exam -Recommended conservative treatment in the absence of neurologic deficits with postural modifications, avoiding extension in favor flexion, modification of her workstation at home, driving, sleeping, gentle stretching, localized heat -Should symptoms persist or worsen, next step would be epidural steroid injections  I will reiterate these updates to the patient at their next scheduled visit.

## 2014-06-07 ENCOUNTER — Other Ambulatory Visit (HOSPITAL_COMMUNITY): Payer: Self-pay | Admitting: Internal Medicine

## 2014-06-07 DIAGNOSIS — Z1231 Encounter for screening mammogram for malignant neoplasm of breast: Secondary | ICD-10-CM

## 2014-06-21 ENCOUNTER — Other Ambulatory Visit: Payer: Self-pay | Admitting: Internal Medicine

## 2014-06-21 NOTE — Telephone Encounter (Signed)
As this patient was seen by me on 03/16/14 and deemed necessary for their treatment as documented in the EHR, I will refill this prescription for albuterol inhaler.

## 2014-06-25 ENCOUNTER — Ambulatory Visit (HOSPITAL_COMMUNITY)
Admission: RE | Admit: 2014-06-25 | Discharge: 2014-06-25 | Disposition: A | Discharge status: Home / Self Care | Payer: Medicare Other | Source: Ambulatory Visit | Attending: Internal Medicine | Admitting: Internal Medicine

## 2014-06-25 DIAGNOSIS — Z1231 Encounter for screening mammogram for malignant neoplasm of breast: Secondary | ICD-10-CM | POA: Diagnosis not present

## 2014-07-06 ENCOUNTER — Encounter: Payer: Self-pay | Admitting: Internal Medicine

## 2014-07-06 NOTE — Progress Notes (Signed)
Patient ID: Tabitha Wilcox, female   DOB: 1955/08/06, 59 y.o.   MRN: [id number]  I am completing paperwork today from [facility] that requires my signature:  Received 07/03/14: request to change PPI from BID to QD  I will address at her next visit.

## 2014-07-20 ENCOUNTER — Encounter: Payer: Self-pay | Admitting: Internal Medicine

## 2014-07-20 ENCOUNTER — Ambulatory Visit (INDEPENDENT_AMBULATORY_CARE_PROVIDER_SITE_OTHER): Payer: Medicare Other | Admitting: Internal Medicine

## 2014-07-20 VITALS — BP 135/74 | HR 106 | Temp 98.0°F | Ht 62.0 in | Wt 190.5 lb

## 2014-07-20 DIAGNOSIS — Z6834 Body mass index (BMI) 34.0-34.9, adult: Secondary | ICD-10-CM | POA: Diagnosis not present

## 2014-07-20 DIAGNOSIS — F17200 Nicotine dependence, unspecified, uncomplicated: Secondary | ICD-10-CM

## 2014-07-20 DIAGNOSIS — K219 Gastro-esophageal reflux disease without esophagitis: Secondary | ICD-10-CM | POA: Diagnosis not present

## 2014-07-20 DIAGNOSIS — G4700 Insomnia, unspecified: Secondary | ICD-10-CM

## 2014-07-20 DIAGNOSIS — F1020 Alcohol dependence, uncomplicated: Secondary | ICD-10-CM

## 2014-07-20 DIAGNOSIS — R7309 Other abnormal glucose: Secondary | ICD-10-CM | POA: Diagnosis not present

## 2014-07-20 DIAGNOSIS — F1021 Alcohol dependence, in remission: Secondary | ICD-10-CM

## 2014-07-20 DIAGNOSIS — E669 Obesity, unspecified: Secondary | ICD-10-CM

## 2014-07-20 DIAGNOSIS — R7303 Prediabetes: Secondary | ICD-10-CM | POA: Insufficient documentation

## 2014-07-20 DIAGNOSIS — J4520 Mild intermittent asthma, uncomplicated: Secondary | ICD-10-CM

## 2014-07-20 DIAGNOSIS — Z720 Tobacco use: Secondary | ICD-10-CM

## 2014-07-20 HISTORY — DX: Obesity, unspecified: E66.9

## 2014-07-20 LAB — GLUCOSE, CAPILLARY: Glucose-Capillary: 119 mg/dL — ABNORMAL HIGH (ref 65–99)

## 2014-07-20 LAB — POCT GLYCOSYLATED HEMOGLOBIN (HGB A1C): Hemoglobin A1C: 6

## 2014-07-20 MED ORDER — TRAZODONE HCL 50 MG PO TABS: 50.0000 mg | ORAL_TABLET | Freq: Every evening | ORAL | Status: DC | PRN

## 2014-07-20 MED ORDER — OMEPRAZOLE 20 MG PO CPDR: 20.0000 mg | DELAYED_RELEASE_CAPSULE | Freq: Every day | ORAL | Status: DC

## 2014-07-20 MED ORDER — ALBUTEROL SULFATE HFA 108 (90 BASE) MCG/ACT IN AERS: INHALATION_SPRAY | RESPIRATORY_TRACT | Status: DC

## 2014-07-20 NOTE — Assessment & Plan Note (Signed)
She reports to me that she drinks 1 wine cooler daily, and I advised her to limit her intake

## 2014-07-20 NOTE — Progress Notes (Signed)
   Subjective:    Patient ID: [name], female    DOB: 1955-03-27, 59 y.o.   MRN: [id number]  HPI Ms. Lindahl is a 59 year old female with a history of asthma, polysubstance abuse [alcohol, tobacco], depression, bilateral knee pain who presents today for follow-up visit.  Please see assessment & plan for documentation of each problem.    Review of Systems  Respiratory: Negative for shortness of breath.   Cardiovascular: Negative for chest pain and leg swelling.  Gastrointestinal: Negative for nausea, vomiting, abdominal pain, diarrhea and blood in stool.  Genitourinary: Negative for dysuria.  Neurological: Negative for dizziness.  Psychiatric/Behavioral: Positive for sleep disturbance. Negative for suicidal ideas and self-injury.       Objective:   Physical Exam Constitutional: Obese African-American woman. She is oriented to person, place, and time. She appears well-developed and well-nourished. No distress.  HENT:  Head: Normocephalic and atraumatic.  Eyes: Conjunctivae are normal. Pupils are equal, round, and reactive to light.  Cardiovascular: Normal rate, regular rhythm and normal heart sounds.  Exam reveals no gallop and no friction rub.   No murmur heard. Pulmonary/Chest: Effort normal. No respiratory distress. She has no wheezes. She has no rales.  Abdominal: Soft. Bowel sounds are normal. She exhibits no distension. There is no tenderness.  Neurological: She is alert and oriented to person, place, and time. Answers questions appropriately. Moving all extremities spontaneously.  Skin: Skin is warm and dry. She is not diaphoretic.  Psychiatric: Her behavior is normal.         Assessment & Plan:

## 2014-07-20 NOTE — Progress Notes (Signed)
Patient ID: Tabitha Wilcox, female   DOB: [birth year], 58 y.o.   MRN: [id number]  I am completing paperwork today from [facility] that requires my signature:  Received 07/17/14: notification that she may not need PPI therapy just because she is taking an NSAID [meloxicam]  I acknowledge their notification though upon my assessment today, she is taking this medication for symptoms suggestive of GERD and not for gastric ulcer prophylaxis because she is on chronic NSAID therapy.

## 2014-07-20 NOTE — Assessment & Plan Note (Signed)
Overview Ongoing issue for the last several months. She reports she has tried Tylenol PM and melatonin without improvement in her symptoms. She does not have a TV in her room at night and consumes only 2 cups of coffee in the morning daily. In the past, she reports Seroquel has helped her, and denies any stressors. She reports that she is able to fall asleep around 10:00 at night that wakes up sometime between 1 and 2 in the morning. She does smoke cigarettes throughout the day with the last one being sometime around 9:00 at night. She does not report that she wakes up at night gasping for air or has morning headaches.  Assessment -Suspect she may have insomnia induced by her tobacco use. Given her large body habitus, I would be concern for OSA though she denies any symptoms like morning headache or snoring. Caffeine use is limited to the morning does not appear to be contributory. Stress/anxiety also does not appear to be contributory. -Given her interest in wanting to lose weight and keep him better about her health, Seroquel would not be ideal given the concern for weight gain.  Plan -Prescribe trazodone 50 mg 30 tablets as needed at bedtime for sleep -Encouraged her to work on tobacco cessation and exercise as both will improve her sleep quality

## 2014-07-20 NOTE — Assessment & Plan Note (Signed)
She reports to me that she has gained weight since her mother's death while other family members have resolved to lose weight. She plans to join a weekly exercise class starting tomorrow and is resolved to quit smoking altogether. I encouraged her in her efforts and told her that should her physical activity not provide the weight loss she desires, she is certainly welcome to discuss nutrition with either me or our on staff health coach, Ms. Butch Penny Plyler.

## 2014-07-20 NOTE — Patient Instructions (Addendum)
General Instructions:   Please bring your medicines with you each time you come to clinic.  Medicines may include prescription medications, over-the-counter medications, herbal remedies, eye drops, vitamins, or other pills.  Please continue working on quitting smoking and exercising. I have written for trazodone that you can take as needed for sleep though exercise and stopping smoking will certainly help with the sleep.   Progress Toward Treatment Goals:  Treatment Goal 03/16/2014  Stop smoking smoking the same amount    Self Care Goals & Plans:  Self Care Goal 03/16/2014  Manage my medications take my medicines as prescribed  Eat healthy foods -  Be physically active -  Stop smoking set a quit date and stop smoking  Other -  Meeting treatment goals maintain the current self-care plan    No flowsheet data found.   Care Management & Community Referrals:  Referral 03/16/2014  Referrals made for care management support none needed  Referrals made to community resources none

## 2014-07-20 NOTE — Assessment & Plan Note (Signed)
  Assessment: Progress toward smoking cessation:  smoking the same amount Barriers to progress toward smoking cessation:  stress Comments:  -She definitely expresses motivation every time I see her in the office though she will require ongoing encouragement. -She also has a resources to help her quit, like nicotine patches and the quit line.  Plan: Instruction/counseling given:  I counseled patient on the dangers of tobacco use, advised patient to stop smoking, and reviewed strategies to maximize success. Educational resources provided:   (She already has resources for the quit line) Self management tools provided:  smoking cessation plan (STAR Quit Plan) Medications to assist with smoking cessation:  Nicotine Patch Patient agreed to the following self-care plans for smoking cessation: call [facility] (1-800-QUIT-NOW)  Other plans:  -Call her every 4-6 weeks to reassess how she is doing when it comes to tobacco cessation

## 2014-07-20 NOTE — Assessment & Plan Note (Signed)
I corrected the listed dosing in our system for her Prilosec she is taking it once daily. I also advised her that she needs to take it 30 minutes before any meal to which she acknowledged understanding.

## 2014-07-20 NOTE — Assessment & Plan Note (Signed)
A1c 6.0, stable from 5.9 when last checked in September.

## 2014-07-25 NOTE — Progress Notes (Signed)
Case discussed with Dr. Patel at the time of the visit.  We reviewed the resident's history and exam and pertinent patient test results.  I agree with the assessment, diagnosis and plan of care documented in the resident's note. 

## 2014-08-03 ENCOUNTER — Encounter: Payer: Self-pay | Admitting: Internal Medicine

## 2014-08-03 NOTE — Progress Notes (Signed)
Patient ID: Tabitha Wilcox, female   DOB: 25-Jul-1955, 59 y.o.   MRN: [id number]  I am completing paperwork today from [facility] that requires my signature:  Received  07/31/14: Suspension sleeve and knee orthosis  Given her history of knee problems, I'm in agreement with this equipment.

## 2014-08-14 ENCOUNTER — Encounter (HOSPITAL_COMMUNITY): Payer: Self-pay | Admitting: Emergency Medicine

## 2014-08-14 ENCOUNTER — Emergency Department (INDEPENDENT_AMBULATORY_CARE_PROVIDER_SITE_OTHER)
Admission: EM | Admit: 2014-08-14 | Discharge: 2014-08-14 | Disposition: A | Discharge status: Home / Self Care | Payer: Medicare Other | Source: Home / Self Care | Attending: Family Medicine | Admitting: Family Medicine

## 2014-08-14 DIAGNOSIS — M791 Myalgia: Secondary | ICD-10-CM | POA: Diagnosis not present

## 2014-08-14 DIAGNOSIS — M7918 Myalgia, other site: Secondary | ICD-10-CM

## 2014-08-14 LAB — POCT URINALYSIS DIP (DEVICE)
Bilirubin Urine: NEGATIVE
Glucose, UA: NEGATIVE mg/dL
Hgb urine dipstick: NEGATIVE
KETONES UR: NEGATIVE mg/dL
Leukocytes, UA: NEGATIVE
NITRITE: NEGATIVE
PH: 6.5 (ref 5.0–8.0)
Protein, ur: NEGATIVE mg/dL
SPECIFIC GRAVITY, URINE: 1.01 (ref 1.005–1.030)
UROBILINOGEN UA: 0.2 mg/dL (ref 0.0–1.0)

## 2014-08-14 MED ORDER — METHOCARBAMOL 500 MG PO TABS: 500.0000 mg | ORAL_TABLET | Freq: Four times a day (QID) | ORAL | Status: DC | PRN

## 2014-08-14 NOTE — ED Notes (Signed)
C/o left flank and back pain.   Pain in right thigh.  On set early this a.m.  No relief with taking tylenol or using volteren gel.  Denies any known injury.  No urinary symptoms.  No fever, n/v/d.

## 2014-08-14 NOTE — Discharge Instructions (Signed)
Please continue the meloxicam and Voltaren for the pain. There is no evidence of this is related to kidney stones or urinary tract infection. This is all likely due to some muscle strain and possible spasm. Please start the muscle relaxer for additional relief. Please apply heat to this area and gently massage the area. remember to take nice slow deep breaths. Have a great trip to [place].

## 2014-08-14 NOTE — ED Provider Notes (Signed)
CSN: [id number]     Arrival date & time 08/14/14  1801 History   First MD Initiated Contact with Patient 08/14/14 1851     No chief complaint on file.  (Consider location/radiation/quality/duration/timing/severity/associated sxs/prior Treatment) HPI  L side pain: sarted at 9:30 today. No radiation. Worse w/ deep breathing. No change in exercise routine. Tylenol w/o benefit. Took a muscle relaxer from a neighbor w/ some improvement. Sharp. Constant.   One other episode on week ago. Lasted 2 days.  Denies dysuria, frequency, hematuria, fevers, nausea, vomiting, abdominal pain, radiation of pain down to legs, falls, trauma, chest pain, shortness of breath, palpitations, cough. Patient states that over the last couple weeks she's changed her diet to a low Siefert vessel diet in order to lose weight. She has daily bowel movements.  Past Medical History  Diagnosis Date  . Asthma   . Tobacco abuse   . Depression   . GERD (gastroesophageal reflux disease)   . Alcoholism   . Anxiety   . Arthritis   . Carpal tunnel syndrome on both sides   . Rotator cuff injury     Right side  . Sciatica   . Ulcer     1990's  . Shortness of breath    Past Surgical History  Procedure Laterality Date  . Appendectomy    . Upper gastrointestinal endoscopy    . Irrigation and debridement abscess Right 07/05/2012    Procedure: IRRIGATION AND DEBRIDEMENT RIGHT LABIAL/THIGH ABSCESS;  Surgeon: Harl Bowie, MD;  Location: [facility];  Service: General;  Laterality: Right;   Family History  Problem Relation Age of Onset  . Colon cancer Maternal Aunt   . Colon cancer Paternal Uncle    History  Substance Use Topics  . Smoking status: Current Every Day Smoker -- 0.30 packs/day for 25 years    Types: Cigarettes  . Smokeless tobacco: Never Used     Comment: Currently smoking 1/4-1 pack per day depending on how stressed she is.  . Alcohol Use: No     Comment: Stopped alcohol since Thursday.   OB History    No data available     Review of Systems Per HPI with all other pertinent systems negative.   Allergies  Review of patient's allergies indicates no known allergies.  Home Medications   Prior to Admission medications   Medication Sig Start Date End Date Taking? Authorizing Provider  diclofenac sodium (VOLTAREN) 1 % GEL Apply 2-4 times daily too  ilateral knees as needed for OA pain 10/30/13  Yes Dickie La, MD  meloxicam (MOBIC) 15 MG tablet Take 1 tablet (15 mg total) by mouth daily. 01/26/14  Yes Dickie La, MD  omeprazole (PRILOSEC) 20 MG capsule Take 1 capsule (20 mg total) by mouth daily before lunch. 07/20/14  Yes Rushil Sherrye Payor, MD  traZODone (DESYREL) 50 MG tablet Take 1 tablet (50 mg total) by mouth at bedtime as needed for sleep. 07/20/14  Yes Rushil Sherrye Payor, MD  acetaminophen (TYLENOL) 325 MG tablet Take 2 tablets (650 mg total) by mouth every 4 (four) hours as needed. 12/28/13 12/28/14  Jones Bales, MD  albuterol (VENTOLIN HFA) 108 (90 BASE) MCG/ACT inhaler Inhale 2 puffs into the lungs every 6 hours as needed. 07/20/14   Rushil Sherrye Payor, MD  CALCIUM PO Take 1 tablet by mouth daily.    Historical Provider, MD  Cyanocobalamin (VITAMIN B-12 PO) Take 1 capsule by mouth daily.    Historical Provider, MD  fish oil-omega-3 fatty acids 1000 MG capsule Take by mouth daily.    Historical Provider, MD  methocarbamol (ROBAXIN) 500 MG tablet Take 1-2 tablets (500-1,000 mg total) by mouth every 6 (six) hours as needed for muscle spasms. 08/14/14   Waldemar Dickens, MD  Multiple Vitamins-Minerals (HAIR/SKIN/NAILS PO) Take 1 tablet by mouth daily.    Historical Provider, MD  nicotine (NICODERM CQ - DOSED IN MG/24 HOURS) 14 mg/24hr patch Place 1 patch (14 mg total) onto the skin daily. 01/07/14   Rushil Sherrye Payor, MD   BP 121/68 mmHg  Pulse 105  Temp(Src) 98.1 F (36.7 C) (Oral)  Resp 16  SpO2 100% Physical Exam Physical Exam  Constitutional: oriented to person, place, and time. appears  well-developed and well-nourished. No distress.  HENT:  Head: Normocephalic and atraumatic.  Eyes: EOMI. PERRL.  Neck: Normal range of motion.  Cardiovascular: RRR, no m/r/g, 2+ distal pulses,  Pulmonary/Chest: Effort normal and breath sounds normal. No respiratory distress.  Abdominal: Soft. Bowel sounds are normal. NonTTP, no distension.  Musculoskeletal: Normal range of motion. Non ttp, no effusion.  no CVA tenderness minimal tenderness on deep palpation of the left lower rib and upper abdominal region.  Neurological: alert and oriented to person, place, and time.  Skin: Skin is warm. No rash noted. non diaphoretic.  Psychiatric: normal mood and affect. behavior is normal. Judgment and thought content normal.   ED Course  Procedures (including critical care time) Labs Review Labs Reviewed  POCT URINALYSIS DIP (DEVICE)    Imaging Review No results found.   MDM   1. Musculoskeletal pain    UA unremarkable. Likely muscular skeletal pain. Continue meloxicam and Voltaren, start gentle massage and stretching of the area. Start Robaxin. AS needed.    Waldemar Dickens, MD 08/14/14 [phone]

## 2014-08-24 ENCOUNTER — Encounter: Payer: Self-pay | Admitting: Internal Medicine

## 2014-08-24 NOTE — Progress Notes (Signed)
Patient ID: Tabitha Wilcox, female   DOB: November 01, 1955, 59 y.o.   MRN: [id number]   Reason for call:   I placed an outgoing call to [name] at 1:20  PM regarding update on her smoking and weight loss.   Assessment/ Plan:   Smoking: She reports to me that she is down from 1 pack/day to 1/2 pack/day.   Weight loss: She is working on eating more fruits & vegetables. She has been consistently going to her aerobics class once a week though is working on trying to go more though is limited by caring for her granddaughter. She does walk daily though is not weighing herself. In fact, she'd like to have her weight checked in clinic to see how much she has improved.  She is agreeable to being seen in August for a follow-up.  As always, pt is advised that if symptoms worsen or new symptoms arise, they should go to an urgent care facility or to to ER for further evaluation.   Riccardo Dubin, MD   08/24/2014, 1:23 PM

## 2014-10-04 ENCOUNTER — Other Ambulatory Visit: Payer: Self-pay | Admitting: *Deleted

## 2014-10-04 MED ORDER — MELOXICAM 15 MG PO TABS: 15.0000 mg | ORAL_TABLET | Freq: Every day | ORAL | Status: DC

## 2014-10-10 ENCOUNTER — Encounter: Payer: Self-pay | Admitting: Internal Medicine

## 2014-10-10 NOTE — Progress Notes (Signed)
Patient ID: Tabitha Wilcox, female   DOB: December 05, 1955, 59 y.o.   MRN: [id number]  I am completing paperwork today from [facility] [place] that requires my signature:  Received 10/08/14: reassessment for continued use of PPI and consideration for stepdown to H2 antagonist therapy  Her EGD was notable for erosive esophagitis in 2013 and will revisit at her next appointment.

## 2014-10-15 ENCOUNTER — Telehealth: Payer: Self-pay | Admitting: Internal Medicine

## 2014-10-15 NOTE — Telephone Encounter (Signed)
  Reason for call:   I placed an outgoing call to [name] at 445  PM to reassess how she was doing with weight loss and exercise.   Assessment/ Plan:   She reports to me that she is still smoking though is now walking 5 miles/day  She looks forward to her appointment with me this Friday, 8/12, and hasn't weighed herself since her last appointment.   I wished her well given that she just celebrated her birthday two days to which she acknowledged appreciation.   Tabitha Dubin, MD   10/15/2014, 4:50 PM

## 2014-10-18 ENCOUNTER — Telehealth: Payer: Self-pay | Admitting: Internal Medicine

## 2014-10-18 NOTE — Telephone Encounter (Signed)
Call to patient to confirm appointment for 10/19/14 at 1:45 lmtcb

## 2014-10-19 ENCOUNTER — Encounter: Payer: Medicare Other | Admitting: Internal Medicine

## 2014-10-19 ENCOUNTER — Encounter: Payer: Self-pay | Admitting: Internal Medicine

## 2014-10-19 ENCOUNTER — Ambulatory Visit (INDEPENDENT_AMBULATORY_CARE_PROVIDER_SITE_OTHER): Payer: Medicare Other | Admitting: Family Medicine

## 2014-10-19 ENCOUNTER — Encounter: Payer: Self-pay | Admitting: Family Medicine

## 2014-10-19 VITALS — BP 141/84 | HR 110 | Ht 62.0 in | Wt 183.6 lb

## 2014-10-19 DIAGNOSIS — M170 Bilateral primary osteoarthritis of knee: Secondary | ICD-10-CM

## 2014-10-19 DIAGNOSIS — M25561 Pain in right knee: Secondary | ICD-10-CM | POA: Diagnosis not present

## 2014-10-19 DIAGNOSIS — M25562 Pain in left knee: Secondary | ICD-10-CM

## 2014-10-19 MED ORDER — METHYLPREDNISOLONE ACETATE 40 MG/ML IJ SUSP
40.0000 mg | Freq: Once | INTRAMUSCULAR | Status: AC
Start: 2014-10-19 — End: 2014-10-19
  Administered 2014-10-19: 40 mg via INTRA_ARTICULAR

## 2014-10-19 MED ORDER — METHYLPREDNISOLONE ACETATE 40 MG/ML IJ SUSP
40.0000 mg | Freq: Once | INTRAMUSCULAR | Status: AC
  Administered 2014-10-19: 40 mg via INTRA_ARTICULAR

## 2014-10-19 NOTE — Progress Notes (Signed)
   Subjective:    Patient ID: Tabitha Wilcox, female    DOB: [birth year], 59 y.o.   MRN: [id number]  HPI  Bilateral knee pain. Had B CSI many onths ago and helped significantly. Walking 3-5 m each day now. Pain returned in last 4-6 weeks, same sx as before. No locking or giving way, just aching. Pain worse with exercise.  Review of Systems Pertinent review of systems: negative for fever or unusual weight change.     Objective:   Physical Exam  Vital signs reviewed. GENERAL: Well-developed, well-nourished, no acute distress. KNEES: B from in flexion and extension. Ligamentously intact to varus and valgus stress. Normal lachman. TTP medial joint line. Popliteal space is soft. Calf is non tender SKIN: no rash, lesion or unusual coloration in area of knees.  VASC :normal cap refill B LE.  INJECTION: Patient was given informed consent, signed copy in the chart. Appropriate time out was taken. Area prepped and draped in usual sterile fashion. 1 cc of methylprednisolone 40 mg/ml plus  4 cc of 1% lidocaine without epinephrine was injected into the Bilateral knees  using a(n) anterior medial approach. The patient tolerated the procedure well. There were no complications. Post procedure instructions were given.           Assessment & Plan:

## 2014-10-20 DIAGNOSIS — M179 Osteoarthritis of knee, unspecified: Secondary | ICD-10-CM | POA: Insufficient documentation

## 2014-10-20 DIAGNOSIS — M1710 Unilateral primary osteoarthritis, unspecified knee: Secondary | ICD-10-CM | POA: Insufficient documentation

## 2014-10-20 NOTE — Assessment & Plan Note (Signed)
Bilateral CSI today Applauded her continued exercise regimen

## 2014-11-26 DIAGNOSIS — M7541 Impingement syndrome of right shoulder: Secondary | ICD-10-CM | POA: Diagnosis not present

## 2014-12-21 ENCOUNTER — Encounter: Payer: Self-pay | Admitting: Internal Medicine

## 2014-12-21 ENCOUNTER — Ambulatory Visit (INDEPENDENT_AMBULATORY_CARE_PROVIDER_SITE_OTHER): Payer: Medicare Other | Admitting: Internal Medicine

## 2014-12-21 ENCOUNTER — Other Ambulatory Visit: Payer: Self-pay | Admitting: Internal Medicine

## 2014-12-21 VITALS — BP 125/76 | HR 117 | Temp 98.0°F | Ht 62.0 in | Wt 177.0 lb

## 2014-12-21 DIAGNOSIS — K219 Gastro-esophageal reflux disease without esophagitis: Secondary | ICD-10-CM | POA: Diagnosis not present

## 2014-12-21 DIAGNOSIS — Z7951 Long term (current) use of inhaled steroids: Secondary | ICD-10-CM | POA: Diagnosis not present

## 2014-12-21 DIAGNOSIS — Z720 Tobacco use: Secondary | ICD-10-CM

## 2014-12-21 DIAGNOSIS — J45909 Unspecified asthma, uncomplicated: Secondary | ICD-10-CM

## 2014-12-21 DIAGNOSIS — Z23 Encounter for immunization: Secondary | ICD-10-CM | POA: Diagnosis present

## 2014-12-21 DIAGNOSIS — M47816 Spondylosis without myelopathy or radiculopathy, lumbar region: Secondary | ICD-10-CM

## 2014-12-21 DIAGNOSIS — F17200 Nicotine dependence, unspecified, uncomplicated: Secondary | ICD-10-CM | POA: Diagnosis not present

## 2014-12-21 DIAGNOSIS — E669 Obesity, unspecified: Secondary | ICD-10-CM

## 2014-12-21 DIAGNOSIS — R7303 Prediabetes: Secondary | ICD-10-CM | POA: Diagnosis not present

## 2014-12-21 DIAGNOSIS — Z6832 Body mass index (BMI) 32.0-32.9, adult: Secondary | ICD-10-CM | POA: Diagnosis not present

## 2014-12-21 DIAGNOSIS — M4696 Unspecified inflammatory spondylopathy, lumbar region: Secondary | ICD-10-CM | POA: Diagnosis not present

## 2014-12-21 DIAGNOSIS — M47814 Spondylosis without myelopathy or radiculopathy, thoracic region: Secondary | ICD-10-CM | POA: Insufficient documentation

## 2014-12-21 DIAGNOSIS — J4520 Mild intermittent asthma, uncomplicated: Secondary | ICD-10-CM

## 2014-12-21 MED ORDER — ACETAMINOPHEN 500 MG PO TABS: 1000.0000 mg | ORAL_TABLET | Freq: Three times a day (TID) | ORAL | Status: AC | PRN

## 2014-12-21 MED ORDER — IBUPROFEN 800 MG PO TABS: 800.0000 mg | ORAL_TABLET | Freq: Three times a day (TID) | ORAL | Status: DC | PRN

## 2014-12-21 MED ORDER — OMEPRAZOLE 20 MG PO CPDR: 20.0000 mg | DELAYED_RELEASE_CAPSULE | Freq: Every day | ORAL | Status: DC

## 2014-12-21 MED ORDER — ALBUTEROL SULFATE HFA 108 (90 BASE) MCG/ACT IN AERS: INHALATION_SPRAY | RESPIRATORY_TRACT | Status: DC

## 2014-12-21 NOTE — Assessment & Plan Note (Signed)
Refilled albuterol inhaler today.

## 2014-12-21 NOTE — Assessment & Plan Note (Signed)
Refilled Prilosec today.

## 2014-12-21 NOTE — Assessment & Plan Note (Addendum)
Down to 1/2 ppd. Encouraged her to keep up the good work, especially with regards to how it will help her bones.

## 2014-12-21 NOTE — Progress Notes (Signed)
   Subjective:    Patient ID: Tabitha Wilcox, female    DOB: 1956-01-07, 59 y.o.   MRN: [id number]  HPI Tabitha Wilcox is a 59 year old female with obesity, ongoing tobacco abuse, h/o depression, insomnia who presents today for follow-up visit. Please see assessment & plan for documentation of chronic medical problems.    Review of Systems  Constitutional: Negative for activity change, appetite change and unexpected weight change.  Gastrointestinal: Negative for nausea, vomiting, abdominal pain and diarrhea.  Musculoskeletal: Positive for back pain, arthralgias and gait problem.  Neurological: Positive for weakness. Negative for numbness.       Objective:   Physical Exam Constitutional: Obese African American female. Moderately distressed.  Head: Normocephalic and atraumatic.  Eyes: Conjunctivae are normal. Pupils are 4 mm, direct, consensual, near.  Cardiovascular: Tachycardic, regular rhythm and normal heart sounds.  No gallop, friction rub, murmur heard. Pulmonary/Chest: Effort normal. No respiratory distress. No wheezes, rales.  Abdominal: Soft. Bowel sounds are normal. No distension. No tenderness.  Neurological: Alert and oriented to person, place, and time.  Coordination normal.  MSK: 4/5 hip flexion/extension bilaterally. Point tenderness of bilateral lower back. No point tenderness elicited over the other vertebrae.  Skin: Warm and dry. Not diaphoretic.  Psychiatric: Affect mobile and congruent with thought content         Assessment & Plan:

## 2014-12-21 NOTE — Assessment & Plan Note (Signed)
Weight 177 lbs today, down from 184 back in August which she reports is due to diet and exercise. Encouraged her to keep up the good work.

## 2014-12-21 NOTE — Patient Instructions (Signed)
Please alternate ibuprofen with Tylenol. Do not take more than 3,000mg  of Tylenol for 3,200mg  of ibuprofen in 1 day.  If no better, please come back in 1 week.  Keep up the good work!

## 2014-12-21 NOTE — Assessment & Plan Note (Signed)
Offered A1c testing today if she was interested though would like to defer given her acute pain and limited time. Suspect it will be stable or improved with recent weight loss.

## 2014-12-21 NOTE — Assessment & Plan Note (Signed)
Complains of acute flare of her pain for the past six weeks though has been ongoing for the past few years. Pain is worse as the day progresses, especially at night. Cannot identify any particular activity or motion that makes her pain worse. No better with Tylenol 500mg  x 2-3 tablets at one time and has taken as much as 12 tablets at once. Concerned she may need a referral to Rheumatology and requesting bloodwork done with "[facility]." Denies any constitutional symptoms, unintentional weight loss, change in appetite/energy, new places of swelling, numbness/tingling, bowel/bladder dysfunction.  Physical exam findings suggestive of acute-on-chronic arthritic flare. Reviewed imaging from last year with her. Explained to her that [facility] was a panel of tests to look for inflammatory biomarkers, some of which were not conventional. Prescribed ibuprofen 800mg  TID prn for pain to be alternated with Tylenol 1000mg  TID prn and advised her to stop taking Mobic and Voltaren in the mean time to avoid any adverse medication effects.

## 2014-12-24 NOTE — Progress Notes (Signed)
[facility] Attending  Case discussed with Dr. Patel,Rushil soon after the resident saw the patient.  We reviewed the resident's history and exam and pertinent patient test results.  I agree with the assessment, diagnosis, and plan of care documented in the resident's note. 

## 2014-12-31 ENCOUNTER — Other Ambulatory Visit: Payer: Self-pay | Admitting: Family Medicine

## 2014-12-31 ENCOUNTER — Other Ambulatory Visit: Payer: Self-pay | Admitting: *Deleted

## 2014-12-31 ENCOUNTER — Ambulatory Visit: Payer: Medicare Other | Admitting: Internal Medicine

## 2014-12-31 ENCOUNTER — Other Ambulatory Visit: Payer: Self-pay | Admitting: Neurosurgery

## 2014-12-31 DIAGNOSIS — M545 Low back pain: Principal | ICD-10-CM

## 2014-12-31 DIAGNOSIS — M47816 Spondylosis without myelopathy or radiculopathy, lumbar region: Secondary | ICD-10-CM

## 2014-12-31 DIAGNOSIS — M5126 Other intervertebral disc displacement, lumbar region: Secondary | ICD-10-CM

## 2014-12-31 DIAGNOSIS — G8929 Other chronic pain: Secondary | ICD-10-CM

## 2015-01-02 ENCOUNTER — Ambulatory Visit
Admission: RE | Admit: 2015-01-02 | Discharge: 2015-01-02 | Disposition: A | Discharge status: Home / Self Care | Payer: Medicaid Other | Source: Ambulatory Visit | Attending: Family Medicine | Admitting: Family Medicine

## 2015-01-02 DIAGNOSIS — G8929 Other chronic pain: Secondary | ICD-10-CM

## 2015-01-02 DIAGNOSIS — M5416 Radiculopathy, lumbar region: Secondary | ICD-10-CM | POA: Diagnosis not present

## 2015-01-02 DIAGNOSIS — M545 Low back pain: Principal | ICD-10-CM

## 2015-01-02 MED ORDER — IOHEXOL 180 MG/ML  SOLN
1.0000 mL | Freq: Once | INTRAMUSCULAR | Status: DC | PRN
  Administered 2015-01-02: 1 mL via EPIDURAL

## 2015-01-02 MED ORDER — METHYLPREDNISOLONE ACETATE 40 MG/ML INJ SUSP (RADIOLOG
120.0000 mg | Freq: Once | INTRAMUSCULAR | Status: AC
  Administered 2015-01-02: 120 mg via EPIDURAL

## 2015-01-02 NOTE — Discharge Instructions (Signed)

## 2015-01-21 ENCOUNTER — Telehealth: Payer: Self-pay | Admitting: *Deleted

## 2015-01-21 ENCOUNTER — Encounter: Payer: Self-pay | Admitting: *Deleted

## 2015-01-21 DIAGNOSIS — M47816 Spondylosis without myelopathy or radiculopathy, lumbar region: Secondary | ICD-10-CM

## 2015-01-21 NOTE — Telephone Encounter (Signed)
That is up to the radiologists--I doubt they would do one this soon but she can ask them S .

## 2015-01-21 NOTE — Telephone Encounter (Signed)
Pt is still having back pain after her injection at [facility] on 01/02/15.  Wants to know how soon she could get another one?

## 2015-01-22 ENCOUNTER — Other Ambulatory Visit: Payer: Self-pay | Admitting: Family Medicine

## 2015-01-22 DIAGNOSIS — M545 Low back pain: Principal | ICD-10-CM

## 2015-01-22 DIAGNOSIS — G8929 Other chronic pain: Secondary | ICD-10-CM

## 2015-01-29 ENCOUNTER — Ambulatory Visit
Admission: RE | Admit: 2015-01-29 | Discharge: 2015-01-29 | Disposition: A | Discharge status: Home / Self Care | Payer: Medicaid Other | Source: Ambulatory Visit | Attending: Family Medicine | Admitting: Family Medicine

## 2015-01-29 ENCOUNTER — Other Ambulatory Visit: Payer: Self-pay | Admitting: Family Medicine

## 2015-01-29 DIAGNOSIS — M545 Low back pain, unspecified: Secondary | ICD-10-CM

## 2015-01-29 DIAGNOSIS — G8929 Other chronic pain: Secondary | ICD-10-CM

## 2015-01-29 MED ORDER — IOHEXOL 180 MG/ML  SOLN
1.0000 mL | Freq: Once | INTRAMUSCULAR | Status: DC | PRN
  Administered 2015-01-29: 1 mL via EPIDURAL

## 2015-01-29 MED ORDER — METHYLPREDNISOLONE ACETATE 40 MG/ML INJ SUSP (RADIOLOG
120.0000 mg | Freq: Once | INTRAMUSCULAR | Status: AC
Start: 2015-01-29 — End: 2015-01-29
  Administered 2015-01-29: 120 mg via EPIDURAL

## 2015-02-08 ENCOUNTER — Ambulatory Visit (INDEPENDENT_AMBULATORY_CARE_PROVIDER_SITE_OTHER): Payer: Medicare Other | Admitting: Internal Medicine

## 2015-02-08 ENCOUNTER — Encounter: Payer: Self-pay | Admitting: Internal Medicine

## 2015-02-08 ENCOUNTER — Other Ambulatory Visit: Payer: Self-pay | Admitting: Internal Medicine

## 2015-02-08 VITALS — BP 133/81 | HR 101 | Temp 97.5°F | Wt 178.0 lb

## 2015-02-08 DIAGNOSIS — R768 Other specified abnormal immunological findings in serum: Secondary | ICD-10-CM | POA: Insufficient documentation

## 2015-02-08 DIAGNOSIS — M4696 Unspecified inflammatory spondylopathy, lumbar region: Secondary | ICD-10-CM | POA: Diagnosis present

## 2015-02-08 DIAGNOSIS — E669 Obesity, unspecified: Secondary | ICD-10-CM

## 2015-02-08 DIAGNOSIS — Z119 Encounter for screening for infectious and parasitic diseases, unspecified: Secondary | ICD-10-CM

## 2015-02-08 DIAGNOSIS — M47816 Spondylosis without myelopathy or radiculopathy, lumbar region: Secondary | ICD-10-CM

## 2015-02-08 MED ORDER — TRAMADOL HCL 50 MG PO TABS: 50.0000 mg | ORAL_TABLET | Freq: Two times a day (BID) | ORAL | Status: DC | PRN

## 2015-02-08 NOTE — Progress Notes (Signed)
   Subjective:    Patient ID: Tabitha Wilcox, female    DOB: [birth year], 59 y.o.   MRN: [id number]  HPI Ms. Bisceglia is a 59 year old female with history of alcohol dependence, depression, multilevel degenerative disc disease, GERD, chronic asthma who presents today with her daughter and grandchildren for worsening low back pain. Please see assessment & plan for documentation of chronic medical problems.    Review of Systems  Constitutional: Positive for appetite change. Negative for unexpected weight change.  Respiratory: Negative for shortness of breath.   Cardiovascular: Negative for chest pain and leg swelling.  Gastrointestinal: Positive for vomiting. Negative for nausea, abdominal pain and diarrhea.  Genitourinary: Negative for dysuria.  Musculoskeletal: Positive for back pain, arthralgias and gait problem.  Neurological: Positive for numbness.       Objective:   Physical Exam  Constitutional: She is oriented to person, place, and time. She appears well-developed and well-nourished. She appears distressed.  HENT:  Head: Normocephalic and atraumatic.  Mouth/Throat: Oropharynx is clear and moist. No oropharyngeal exudate.  Eyes: Conjunctivae are normal. Pupils are equal, round, and reactive to light.  Cardiovascular: Normal rate and regular rhythm.  Exam reveals no gallop and no friction rub.   No murmur heard. Pulmonary/Chest: Effort normal and breath sounds normal. No respiratory distress. She has no wheezes. She has no rales.  Musculoskeletal:  Straight leg test notable for increased pain on right lower extremity at roughly 30 above the table in 45 for the contralateral extremity.  Neurological: She is alert and oriented to person, place, and time. No cranial nerve deficit. She exhibits normal muscle tone.  Skin: Skin is warm and dry. She is not diaphoretic.           Assessment & Plan:

## 2015-02-08 NOTE — Assessment & Plan Note (Addendum)
Overview Along with the rest of her blood work, she is agreeable to having hepatitis C and HIV checked given USPSTF recommendations.  Assessment At risk for hepatitis C given birth year though do not suspect she is at risk for HIV.  Plan Check HIV and hepatitis C today  ADDENDUM 02/11/2015  4:54 PM:  HIV-negative, hep C antibody positive. Spoke with lab to add on viral load. Attempted to call the patient but left voicemail.

## 2015-02-08 NOTE — Assessment & Plan Note (Addendum)
Overview Since her last visit, her daughter reports that her mother's back pain has worsened to the point where it is now affecting her ability to function on a day-to-day basis. The patient reports that her pain has now progressed with extension from her right back all the way down her right leg. She characterizes it as a tingling/numbness sensation. Her daughter feels that she is spending the majority of her day either resting in chair or in bed.   She attempted to take Tylenol alternating with ibuprofen as instructed from her last office visit though felt no relief. She contacted her sports medicine doctor who then referred her for joint injections and underwent them on 10/26 and 11/22. While she felt some relief earlier this year when she was first initiated on these injections, she felt minimal relief. Her daughter also reports that she has consumed an entire box of BC powders as 50 packets and a quarter bottle of extra strength Tylenol though the patient denies any hematemesis, melena, hematochezia but has experienced some nausea and vomiting, most recently several days ago.  Daughter wanted me to know that despite her mother's prior substance use disorder, she does not want her mother to be treated as though she is a pain seeking patient.  Assessment Poorly controlled pain in the setting of multilevel degenerative disc arthritis and possibly radiculopathy  Plan -Prescribed her a 15 day course of tramadol 50 mg 30 tablets with plan to follow-up thereafter. Explained that this medication is not a permanent one and is only meant to help ease her current symptoms. -Placed a referral for physical therapy to teach her exercises so that she may maintain some degree of strength and to avoid those which may have precipitated this flare. -Barring any improvement at her follow-up in 2 weeks, I'm inclined to refer her back to her surgeon for assessment of surgical management to which she is  agreeable. -Plan to check CBC and CMET as it has been a year but specifically in the setting of recent increased NSAID and acetaminophen use  ADDENDUM 02/11/2015  4:57 PM:  CMET, LFTs, CBC reassuring though she has had platelets in the low 400s on 2 readings.

## 2015-02-08 NOTE — Patient Instructions (Addendum)
Please bring your medicines with you each time you come to clinic.  Medicines may include prescription medications, over-the-counter medications, herbal remedies, eye drops, vitamins, or other pills.  #1 please take tramadol 50 mg every 12 hours as needed for pain. This is just a short course to help you through your pain flare as we help figure out what long-term solution will be.  #2 I referred you to physical therapy who can help teach you some exercises to help avoid future pain flares.  If this does not help, we will refer you to a surgeon for further management. Please see me back in 2 weeks.   Progress Toward Treatment Goals:  Treatment Goal 07/20/2014  Stop smoking smoking the same amount    Self Care Goals & Plans:  Self Care Goal 12/21/2014  Manage my medications take my medicines as prescribed; bring my medications to every visit; refill my medications on time  Monitor my health -  Eat healthy foods eat more vegetables; eat foods that are low in salt; eat baked foods instead of fried foods  Be physically active -  Stop smoking (No Data)  Other -  Meeting treatment goals -    No flowsheet data found.   Care Management & Community Referrals:  Referral 07/20/2014  Referrals made for care management support -  Referrals made to community resources none

## 2015-02-09 LAB — CBC
HEMOGLOBIN: 11.6 g/dL (ref 11.1–15.9)
Hematocrit: 34.8 % (ref 34.0–46.6)
MCH: 30.4 pg (ref 26.6–33.0)
MCHC: 33.3 g/dL (ref 31.5–35.7)
MCV: 91 fL (ref 79–97)
Platelets: 464 10*3/uL — ABNORMAL HIGH (ref 150–379)
RBC: 3.81 x10E6/uL (ref 3.77–5.28)
RDW: 14.4 % (ref 12.3–15.4)
WBC: 8.3 10*3/uL (ref 3.4–10.8)

## 2015-02-09 LAB — CMP14 + ANION GAP
ALT: 29 IU/L (ref 0–32)
AST: 23 IU/L (ref 0–40)
Albumin/Globulin Ratio: 2 (ref 1.1–2.5)
Albumin: 4.5 g/dL (ref 3.5–5.5)
Alkaline Phosphatase: 82 IU/L (ref 39–117)
Anion Gap: 17 mmol/L (ref 10.0–18.0)
BUN/Creatinine Ratio: 11 (ref 9–23)
BUN: 8 mg/dL (ref 6–24)
CALCIUM: 9.9 mg/dL (ref 8.7–10.2)
CHLORIDE: 104 mmol/L (ref 97–106)
CO2: 23 mmol/L (ref 18–29)
Creatinine, Ser: 0.75 mg/dL (ref 0.57–1.00)
GFR calc non Af Amer: 88 mL/min/{1.73_m2} (ref >59)
GFR, EST AFRICAN AMERICAN: 102 mL/min/{1.73_m2} (ref >59)
Globulin, Total: 2.2 g/dL (ref 1.5–4.5)
Glucose: 110 mg/dL — ABNORMAL HIGH (ref 65–99)
Potassium: 4.8 mmol/L (ref 3.5–5.2)
Sodium: 144 mmol/L (ref 136–144)
Total Protein: 6.7 g/dL (ref 6.0–8.5)

## 2015-02-09 LAB — HIV ANTIBODY (ROUTINE TESTING W REFLEX): HIV SCREEN 4TH GENERATION: NONREACTIVE

## 2015-02-11 ENCOUNTER — Ambulatory Visit: Payer: Medicaid Other | Admitting: Family Medicine

## 2015-02-11 LAB — HEPATITIS C ANTIBODY

## 2015-02-12 NOTE — Progress Notes (Signed)
[facility] Attending  Case discussed with Dr. Patel,Rushil soon after the resident saw the patient.  We reviewed the resident's history and exam and pertinent patient test results.  I agree with the assessment, diagnosis, and plan of care documented in the resident's note. 

## 2015-02-13 ENCOUNTER — Telehealth: Payer: Self-pay | Admitting: Internal Medicine

## 2015-02-13 DIAGNOSIS — R768 Other specified abnormal immunological findings in serum: Secondary | ICD-10-CM

## 2015-02-13 NOTE — Telephone Encounter (Signed)
I spoke with the patient and will document my conversation in a separate telephone note so that other providers may see it in the chart.

## 2015-02-13 NOTE — Telephone Encounter (Signed)
  Reason for call:   I placed an outgoing call to [name] at 10:15 AM regarding recent lab work.   Assessment/ Plan:   I reviewed the results of her recent lab testing, including CMET, CBC, HIV, positive hepatitis C antibody.  She did report to me in the past she has received blood products and has had used IV drugs in the 1970s. She also reports that her brother who passed away some years ago and her brother-in-law who recently passed away were both positive for hepatitis C though died of other causes.  I explained to her that the positive antibody could mean one of 3 things: resolution of infection, chronic ongoing infection, active infection. Given that her LFTs were stable, I suspect the latter will be less likely.  I explained to her that she would need to come back in for blood work and will go ahead and put in the orders today. She will try to get here tomorrow.  She thanked me for my time and looks forward to our appointment next week.    Riccardo Dubin, MD   02/13/2015, 10:18 AM

## 2015-02-13 NOTE — Telephone Encounter (Signed)
Message left on home ID recording - clinic is working on Abd [place] request. Papers have been given to C. Cyndi Bender to be approved then sch.

## 2015-02-13 NOTE — Telephone Encounter (Signed)
Pt requesting lab result. Please call pt back. °

## 2015-02-14 ENCOUNTER — Other Ambulatory Visit: Payer: Self-pay | Admitting: Internal Medicine

## 2015-02-14 ENCOUNTER — Other Ambulatory Visit (INDEPENDENT_AMBULATORY_CARE_PROVIDER_SITE_OTHER): Payer: Medicare Other

## 2015-02-14 DIAGNOSIS — M2550 Pain in unspecified joint: Secondary | ICD-10-CM | POA: Diagnosis not present

## 2015-02-14 DIAGNOSIS — R894 Abnormal immunological findings in specimens from other organs, systems and tissues: Secondary | ICD-10-CM

## 2015-02-14 DIAGNOSIS — B199 Unspecified viral hepatitis without hepatic coma: Secondary | ICD-10-CM | POA: Diagnosis not present

## 2015-02-14 DIAGNOSIS — T8029XA Infection following other infusion, transfusion and therapeutic injection, initial encounter: Secondary | ICD-10-CM | POA: Diagnosis not present

## 2015-02-14 DIAGNOSIS — R768 Other specified abnormal immunological findings in serum: Secondary | ICD-10-CM

## 2015-02-14 LAB — PROTIME-INR
INR: 1.01 (ref 0.00–1.49)
PROTHROMBIN TIME: 13.5 s (ref 11.6–15.2)

## 2015-02-14 NOTE — Addendum Note (Signed)
Addended by: Truddie Crumble on: 02/14/2015 02:40 PM   Modules accepted: Orders

## 2015-02-15 LAB — IRON AND TIBC
IRON SATURATION: 31 % (ref 15–55)
IRON: 105 ug/dL (ref 27–159)
Total Iron Binding Capacity: 334 ug/dL (ref 250–450)
UIBC: 229 ug/dL (ref 131–425)

## 2015-02-15 LAB — HEPATITIS B SURFACE ANTIBODY,QUALITATIVE: HEP B SURFACE AB, QUAL: NONREACTIVE

## 2015-02-15 LAB — HEPATITIS B SURFACE ANTIGEN: Hepatitis B Surface Ag: NEGATIVE

## 2015-02-15 LAB — FERRITIN: Ferritin: 112 ng/mL (ref 15–150)

## 2015-02-15 LAB — HEPATITIS A ANTIBODY, TOTAL: HEP A TOTAL AB: NEGATIVE

## 2015-02-15 LAB — ANTINUCLEAR ANTIBODIES, IFA: ANA Titer 1: NEGATIVE

## 2015-02-16 LAB — HCV RNA QUANT: Hepatitis C Quantitation: NOT DETECTED IU/mL

## 2015-02-17 LAB — HEPATITIS C GENOTYPE

## 2015-02-21 ENCOUNTER — Ambulatory Visit (INDEPENDENT_AMBULATORY_CARE_PROVIDER_SITE_OTHER): Payer: Medicare Other | Admitting: Internal Medicine

## 2015-02-21 ENCOUNTER — Encounter: Payer: Self-pay | Admitting: Internal Medicine

## 2015-02-21 VITALS — BP 129/75 | HR 100 | Temp 98.5°F | Ht 62.0 in | Wt 175.0 lb

## 2015-02-21 DIAGNOSIS — G8929 Other chronic pain: Secondary | ICD-10-CM | POA: Diagnosis present

## 2015-02-21 DIAGNOSIS — M47816 Spondylosis without myelopathy or radiculopathy, lumbar region: Secondary | ICD-10-CM

## 2015-02-21 DIAGNOSIS — R894 Abnormal immunological findings in specimens from other organs, systems and tissues: Secondary | ICD-10-CM | POA: Diagnosis not present

## 2015-02-21 DIAGNOSIS — R768 Other specified abnormal immunological findings in serum: Secondary | ICD-10-CM

## 2015-02-21 DIAGNOSIS — M4696 Unspecified inflammatory spondylopathy, lumbar region: Secondary | ICD-10-CM

## 2015-02-21 MED ORDER — CELECOXIB 100 MG PO CAPS: 100.0000 mg | ORAL_CAPSULE | Freq: Every day | ORAL | Status: DC

## 2015-02-21 MED ORDER — TRAZODONE HCL 50 MG PO TABS: 50.0000 mg | ORAL_TABLET | Freq: Every evening | ORAL | Status: DC | PRN

## 2015-02-21 MED ORDER — TRAMADOL HCL 50 MG PO TABS: 100.0000 mg | ORAL_TABLET | Freq: Two times a day (BID) | ORAL | Status: DC | PRN

## 2015-02-21 NOTE — Progress Notes (Signed)
Abd U/S appt canceled per Dr Posey Pronto; pt informed this am at her OV appt per MD.

## 2015-02-21 NOTE — Assessment & Plan Note (Signed)
Overview Since her last visit, she reports resolution of her radicular symptoms. With the tramadol, her pain persisted for about 4-5 days before resolving altogether. She reports taking the tramadol initially 50 mg every 12 hours as needed with ibuprofen 600-800 mg though found it more helpful when she took tramadol 100 mg. In the interval since her last visit, she has had 2-3 "good mornings" and feels she has been able to get up out of bed and function throughout the day. She is thus requesting refill tramadol for her baseline arthritic pain in her knees and back. She is scheduled to follow-up with physical therapy on 12/19.  Assessment Chronic pain in the setting of lumbar arthropathy, improved from prior visit.  Plan -Encouraged her to follow-up with physical therapy to learn exercises so she can continue losing weight -Start Celebrex 100 mg daily with meals with plan to titrate to twice daily with meals in 2 weeks if her pain is no better. She reports using this medication in the past and did find some relief with it. -Refill tramadol 50 mg 40 tablets so she can take 100 mg every 12 hours as needed for acute breakthrough pain. Advised her not to take this medication regularly to avoid tolerance to which she acknowledged understanding. -Plan to recheck to her sports medicine doctor to see if they recommend any thing else.  -Return to follow-up in one month.

## 2015-02-21 NOTE — Progress Notes (Signed)
   Subjective:    Patient ID: Tabitha Wilcox, female    DOB: [birth year], 59 y.o.   MRN: [id number]  HPI [name] is a 59 year old female with history of alcohol dependence, depression, multilevel degenerative disc disease, GERD, chronic asthma who presents today with her daughter and grandchildren for worsening low back pain. Please see assessment & plan for documentation of chronic medical problems.   Review of Systems  Constitutional: Negative for activity change.  Gastrointestinal: Negative for nausea, vomiting, abdominal pain and diarrhea.  Musculoskeletal: Positive for back pain.  Neurological: Negative for numbness.       Objective:   Physical Exam  Constitutional: She is oriented to person, place, and time. She appears well-developed and well-nourished. No distress.  HENT:  Head: Normocephalic and atraumatic.  Eyes: Conjunctivae and EOM are normal. Pupils are equal, round, and reactive to light. No scleral icterus.  Neck: Normal range of motion. Neck supple.  Pulmonary/Chest: No respiratory distress.  Musculoskeletal:  Straight leg test notable for increased pain on right lower extremity at roughly 30 above the table in 45 for the contralateral extremity. No radicular pain elicited on exam today. No paraspinal tenderness noted as well.  Neurological: She is alert and oriented to person, place, and time. No cranial nerve deficit. She exhibits normal muscle tone.  Skin: Skin is warm and dry. She is not diaphoretic.  Psychiatric: She has a normal mood and affect. Her behavior is normal.          Assessment & Plan:

## 2015-02-21 NOTE — Assessment & Plan Note (Addendum)
Overview In the interval following her last visit, she came back for repeat blood work and was found to have a negative viral load for hepatitis C.   Assessment Positive hepatitis C antibody, likely from exposure given history of IV drug use.  Plan -Conveyed results to the patient much to her relief. She denies any active risk factors for infection, like IV drug use or multiple sexual partners. -Canceled scheduled right upper quadrant abdominal ultrasound elastography

## 2015-02-21 NOTE — Patient Instructions (Addendum)
Please start taking Celebrex 100 mg daily with meals. After 2 weeks, you may take it twice daily with meals. Do not mix with ibuprofen or Mobic.  Please take tramadol 100 mg only when your pain gets worse.  Please see me back in 4 weeks.

## 2015-02-25 ENCOUNTER — Ambulatory Visit: Payer: Medicare Other | Attending: Student in an Organized Health Care Education/Training Program

## 2015-02-25 DIAGNOSIS — M62838 Other muscle spasm: Secondary | ICD-10-CM | POA: Insufficient documentation

## 2015-02-25 DIAGNOSIS — R293 Abnormal posture: Secondary | ICD-10-CM | POA: Insufficient documentation

## 2015-02-25 DIAGNOSIS — M5441 Lumbago with sciatica, right side: Secondary | ICD-10-CM | POA: Insufficient documentation

## 2015-02-25 DIAGNOSIS — R29898 Other symptoms and signs involving the musculoskeletal system: Secondary | ICD-10-CM | POA: Insufficient documentation

## 2015-02-26 ENCOUNTER — Ambulatory Visit: Payer: Medicare Other | Admitting: Physical Therapy

## 2015-02-26 NOTE — Progress Notes (Signed)
[facility] Attending  Case discussed with Dr. Patel,Rushil soon after the resident saw the patient.  We reviewed the resident's history and exam and pertinent patient test results.  I agree with the assessment, diagnosis, and plan of care documented in the resident's note. 

## 2015-03-06 ENCOUNTER — Ambulatory Visit: Payer: Medicare Other | Admitting: Physical Therapy

## 2015-03-06 DIAGNOSIS — R293 Abnormal posture: Secondary | ICD-10-CM

## 2015-03-06 DIAGNOSIS — R29898 Other symptoms and signs involving the musculoskeletal system: Secondary | ICD-10-CM

## 2015-03-06 DIAGNOSIS — M62838 Other muscle spasm: Secondary | ICD-10-CM

## 2015-03-06 DIAGNOSIS — M5441 Lumbago with sciatica, right side: Secondary | ICD-10-CM | POA: Diagnosis not present

## 2015-03-06 NOTE — Patient Instructions (Addendum)

## 2015-03-06 NOTE — Therapy (Addendum)
[place], [place], [address] Phone: [phone]   Fax:  [phone]  Physical Therapy Evaluation  Patient Details  Name: Tabitha Wilcox MRN: [id number] Date of Birth: Jun 27, 1955 Referring Provider: Axel Filler  Encounter Date: 03/06/2015      PT End of Session - 03/06/15 1056    Visit Number 1   Number of Visits 1   Date for PT Re-Evaluation 03/07/15   Authorization Type Medicaid   PT Start Time 0845   PT Stop Time 0930   PT Time Calculation (min) 45 min   Activity Tolerance Patient tolerated treatment well   Behavior During Therapy [facility] for tasks assessed/performed      Past Medical History  Diagnosis Date  . Asthma   . Tobacco abuse   . Depression   . GERD (gastroesophageal reflux disease)   . Alcoholism ([place])   . Anxiety   . Arthritis   . Carpal tunnel syndrome on both sides   . Rotator cuff injury     Right side  . Sciatica   . Ulcer     1990's  . Shortness of breath     Past Surgical History  Procedure Laterality Date  . Appendectomy    . Upper gastrointestinal endoscopy    . Irrigation and debridement abscess Right 07/05/2012    Procedure: IRRIGATION AND DEBRIDEMENT RIGHT LABIAL/THIGH ABSCESS;  Surgeon: Harl Bowie, MD;  Location: [facility];  Service: General;  Laterality: Right;    There were no vitals filed for this visit.  Visit Diagnosis:  Right-sided low back pain with right-sided sciatica - Plan: PT plan of care cert/re-cert  Muscle spasm - Plan: PT plan of care cert/re-cert  Abnormal posture - Plan: PT plan of care cert/re-cert  Weakness of both legs - Plan: PT plan of care cert/re-cert      Subjective Assessment - 03/06/15 0850    Subjective pt is a 59 y.o F with CC of low back pain that has been going on for a few years with insidious onset from her prevous job. She reported getting a couple injections in the low back and nothing seems to be helping.  She reports having some numbess and tingling and pain down the R leg into the foot.   Limitations Lifting;House hold activities;Standing;Walking   How long can you sit comfortably? 5-10 min   How long can you stand comfortably? 5-10 min   How long can you walk comfortably? 10 min   Diagnostic tests 02/05/2014 MRI arthritic findings   Patient Stated Goals to be pain free   Currently in Pain? Yes   Pain Score 0-No pain  took pain medication this morning   Pain Location Back   Pain Orientation Right;Lower   Pain Descriptors / Indicators Pins and needles;Aching   Pain Type Chronic pain   Pain Radiating Towards down R leg into foot   Pain Onset More than a month ago   Pain Frequency Constant   Aggravating Factors  prolonged standing, sitting/ walking,    Pain Relieving Factors medication, heating            [facility] PT Assessment - 03/06/15 0857    Assessment   Medical Diagnosis low back pain   Referring Provider Axel Filler   Onset Date/Surgical Date --  2-3 years   Hand Dominance Right   Next MD Visit 03/28/2014   Prior Therapy yes   Precautions   Precautions None  Restrictions   Weight Bearing Restrictions No   Balance Screen   Has the patient fallen in the past 6 months No   Has the patient had a decrease in activity level because of a fear of falling?  No   Is the patient reluctant to leave their home because of a fear of falling?  No   Home Environment   Living Environment Private residence   Living Arrangements Alone   Type of [place] One level   Prior Function   Level of Independence Independent;Independent with basic ADLs   Vocation On disability   Leisure walking, spending time with family, shopping,    Cognition   Overall Cognitive Status Within Functional Limits for tasks assessed   Posture/Postural Control   Posture/Postural Control Postural limitations   ROM / Strength   AROM / PROM / Strength  AROM;Strength   AROM   AROM Assessment Site Lumbar   Lumbar Flexion 80  referral of pain down to RLE at end range   Lumbar Extension 26  pinching sensation at end range   Lumbar - Right Side Bend 24   Lumbar - Left Side Bend 24   Strength   Strength Assessment Site Hip;Knee   Right/Left Hip Right;Left   Right Hip Flexion 4/5   Right Hip ABduction 4-/5   Right Hip ADduction 4/5   Left Hip Flexion 4/5   Left Hip ABduction 4-/5   Left Hip ADduction 4/5   Right/Left Knee Right;Left   Right Knee Flexion 4/5   Right Knee Extension 4/5   Left Knee Flexion 5/5   Left Knee Extension 5/5   Palpation   Spinal mobility hypomobliity of lumbar intervertebral segments L1-L5 with tenderness upon palpation. spasm of the R glutes, piriformis.   Special Tests    Special Tests Lumbar   Lumbar Tests Slump Test;Straight Leg Raise;Prone Knee Bend Test   Slump test   Findings Positive   Side Right   Prone Knee Bend Test   Findings Positive   Side Right   Straight Leg Raise   Findings Negative   Side  --  bil                           PT Education - 03/06/15 1056    Education provided Yes   Education Details evaluation findings, HEP   Person(s) Educated Patient   Methods Explanation   Comprehension Verbalized understanding                    Plan - 03/06/15 1056    Clinical Impression Statement Tabitha Wilcox presents to OPPT with CC of low back pain for the last couple of years with referral of pain down the RLE to the toes. She has hx of injections with no relief of pain. Trunk mobility is WFL with pain and tighness noted at endranges of flexion and extension. MMT revealed weakness of BIL LE with increased weakness in R LE compared bil. special testing was positive with slump testing with increased pain down the RLE. palpation revealed tightness in the glutes and piriformis with hypomobility intervertebral movements of L1-L5.  Educated about exercises and  progression, and provided [facility] clinic handout .   PT Frequency 1x / week  1 visit only   PT Next Visit Plan Medicaid 1 visit only.   PT Home Exercise Plan see HEP handout   Consulted and  Agree with Plan of Care Patient       G-Code: changing and maintaining body position.  Rational: clinical judgement Current status: CK Goals status: CK Discharge status: CK Problem List Patient Active Problem List   Diagnosis Date Noted  . Hepatitis C antibody test positive 02/08/2015  . Thoracic arthritis ([place]) 12/21/2014  . DJD (degenerative joint disease) of knee 10/20/2014  . Obesity 07/20/2014  . Pre-diabetes 07/20/2014  . Insomnia 07/20/2014  . Cervical spine arthritis ([place]) 03/16/2014  . Family history of glaucoma 10/13/2013  . Abscess of groin, right 06/14/2013  . Toenail deformity 06/14/2013  . Osteopenia 05/08/2013  . Healthcare maintenance 12/19/2012  . Alcohol dependence in remission ([place]) 08/15/2012  . Screening for cervical cancer 05/31/2012  . Special screening for malignant neoplasms, colon 08/19/2011  . Medial meniscus tear 07/14/2011  . Tobacco abuse 04/13/2011  . Knee pain, bilateral 03/31/2011  . Ankle pain 03/12/2011  . Esophageal stricture 03/12/2011  . Asthma, chronic 12/26/2010  . GERD (gastroesophageal reflux disease) 12/26/2010  . Carpal tunnel syndrome on both sides   . Rotator cuff injury   . Lumbar arthropathy ([place]) 02/08/2009  . DEPRESSION 12/27/2008   Starr Lake PT, DPT, LAT, ATC  03/06/2015  11:14 AM     [facility] [address] [place], [place], [address] Phone: [phone]   Fax:  [phone]  Name: Tabitha Wilcox MRN: [id number] Date of Birth: [birth year]

## 2015-03-12 ENCOUNTER — Other Ambulatory Visit: Payer: Self-pay | Admitting: Internal Medicine

## 2015-03-12 NOTE — Telephone Encounter (Signed)
Pt requesting tramadol to be filled. °

## 2015-03-13 ENCOUNTER — Other Ambulatory Visit: Payer: Self-pay | Admitting: Internal Medicine

## 2015-03-13 NOTE — Telephone Encounter (Signed)
Can you please ask her how she is doing with the Celebrex? How much of the medication is she taking? I had left her with the following instructions at our last appointment:  "Start Celebrex 100 mg daily with meals with plan to titrate to twice daily with meals in 2 weeks if her pain is no better. She reports using this medication in the past and did find some relief with it."  The Tramadol was meant to be for breakthrough pain. Can you please ask her how much of this medication she took, and how frequently?   I am committed to getting her on a stable pain regiment and would like to continue working in that direction.

## 2015-03-13 NOTE — Telephone Encounter (Signed)
Pt requesting the nurse to call back regarding tramadol.

## 2015-03-13 NOTE — Telephone Encounter (Signed)
Spoke with patient.  She has adujsted to 2 Celebrex since Saturday, she feels that it is helping although she reports still taking 1000mg  of Tylenol prn and taking 2-3 pills of tramadol per day.  She is requesting a refill on the tramadol as she is down to 1 pill.  She wants enough to make it until her appointment on the 20th since it is helping.

## 2015-03-14 MED ORDER — TRAMADOL HCL 50 MG PO TABS: 100.0000 mg | ORAL_TABLET | ORAL | Status: DC

## 2015-03-14 NOTE — Telephone Encounter (Signed)
rx phoned to pharmacy

## 2015-03-14 NOTE — Telephone Encounter (Signed)
I spoke with her over the phone.   Numerically, her last prescription of 50mg  x 40 tablets would constitute 20 doses since she did not find relief on just 50mg  alone. With Tylenol, she has stretched her dose out about a dose/day for the past three weeks using 1000mg  of Tylenol.   To that effect, I will prescribe her 15 doses worth of medication which is 30 tablets until she can see me at follow-up on 1/20.    Please call in this prescription. Thank you.

## 2015-03-14 NOTE — Addendum Note (Signed)
Addended by: Riccardo Dubin on: 03/14/2015 01:13 PM   Modules accepted: Orders

## 2015-03-14 NOTE — Telephone Encounter (Signed)
I will give her a call later today

## 2015-03-20 ENCOUNTER — Ambulatory Visit (HOSPITAL_COMMUNITY): Payer: Medicaid Other

## 2015-03-22 ENCOUNTER — Telehealth: Payer: Self-pay | Admitting: Pharmacist

## 2015-03-27 ENCOUNTER — Telehealth: Payer: Self-pay | Admitting: *Deleted

## 2015-03-27 MED ORDER — DICLOFENAC SODIUM 1 % TD GEL: TRANSDERMAL | Status: DC

## 2015-03-27 NOTE — Telephone Encounter (Signed)
refills  

## 2015-03-29 ENCOUNTER — Ambulatory Visit (INDEPENDENT_AMBULATORY_CARE_PROVIDER_SITE_OTHER): Payer: Medicare Other | Admitting: Internal Medicine

## 2015-03-29 ENCOUNTER — Encounter: Payer: Self-pay | Admitting: Internal Medicine

## 2015-03-29 VITALS — BP 129/97 | HR 118 | Temp 98.7°F | Ht 62.0 in | Wt 172.6 lb

## 2015-03-29 DIAGNOSIS — M47816 Spondylosis without myelopathy or radiculopathy, lumbar region: Secondary | ICD-10-CM

## 2015-03-29 DIAGNOSIS — Z79891 Long term (current) use of opiate analgesic: Secondary | ICD-10-CM | POA: Diagnosis not present

## 2015-03-29 DIAGNOSIS — M4696 Unspecified inflammatory spondylopathy, lumbar region: Secondary | ICD-10-CM

## 2015-03-29 DIAGNOSIS — F17200 Nicotine dependence, unspecified, uncomplicated: Secondary | ICD-10-CM | POA: Diagnosis not present

## 2015-03-29 DIAGNOSIS — F1290 Cannabis use, unspecified, uncomplicated: Secondary | ICD-10-CM

## 2015-03-29 DIAGNOSIS — Z720 Tobacco use: Secondary | ICD-10-CM

## 2015-03-29 MED ORDER — CELECOXIB 100 MG PO CAPS: 200.0000 mg | ORAL_CAPSULE | Freq: Every day | ORAL | Status: DC

## 2015-03-29 MED ORDER — TRAMADOL HCL 50 MG PO TABS: 50.0000 mg | ORAL_TABLET | ORAL | Status: DC

## 2015-03-29 NOTE — Assessment & Plan Note (Addendum)
Overview Since her last visit, she finds adequate relief with tramadol 50 mg when combined with Tylenol 1000 mg every 12 hours. At its worse, she may need to take tramadol 100 mg every 12 hours. She has also been taking celecoxib 200 mg daily. Due to the relief in her pain, she has been able to leave the house and participate in activities with her family along with performing ADLs, like showering and bathing, as she was concerned that she might fall when her pain was uncontrolled. She also reports doing the exercises she learned at physical therapy at least twice a week and is very interested in losing weight. She denies any stomach pain, nausea, vomiting, constipation, falls.  Assessment Pain in the setting of lumbar arthropathy now well-controlled with NSAID and opiate therapy.  Plan -Reviewed and signed pain contract with her today in clinic for tramadol 50 mg 60 tablets every 30 days to be taken 1-2 tablets a day -Ordered UDS today. She does report taking Vicodin from a neighbor because her medication was unavailable when she was away from her home and has smoked marijuana intermittently. She is aware as a part of clinic policy that she is not supposed to take any additional illicit substances pain medication while she is on this contract with our clinic to which she acknowledged understanding. -Provided patient a copy of pain contract to review conditions should she ever have additional questions -Plan to follow-up in 3 months -Refilled celecoxib 200 mg to be continued daily  ADDENDUM 04/27/2015  4:35 PM:  I forgot to send a medication list with the sample but spoke with the lab. Urine is appropriate for tramadol, THC but inappropriate for cocaine metabolites, especially cocaethylene which is seen when cocaine and alcohol are ingested concomitantly.  I'm afraid this will limit my ability to refill her tramadol without speaking to her. I attempted to call her today to discuss refill request but  was unsuccessful and will try again tomorrow.

## 2015-03-29 NOTE — Progress Notes (Signed)
   Subjective:    Patient ID: Tabitha Wilcox, female    DOB: [birth year], 60 y.o.   MRN: [id number]  HPI Tabitha Wilcox is a 60 year old female with history of alcohol dependence, depression, multilevel degenerative disc disease, GERD, chronic asthma who presents today with her daughter and grandchildren for low back pain follow-up. Please see assessment & plan for documentation of chronic medical problems.  Pain control: UDS  1/5: tramadol 50 x 30 tablets   Review of Systems  Respiratory: Negative for shortness of breath.   Cardiovascular: Negative for chest pain.  Gastrointestinal: Negative for abdominal pain.  Musculoskeletal: Positive for back pain.       Objective:   Physical Exam  Constitutional: She is oriented to person, place, and time. She appears well-developed and well-nourished. No distress.  HENT:  Head: Normocephalic and atraumatic.  Eyes: Conjunctivae are normal. No scleral icterus.  Neck: Normal range of motion. Neck supple. No tracheal deviation present.  Cardiovascular: Normal rate and regular rhythm.   Pulmonary/Chest: Effort normal and breath sounds normal. No stridor. No respiratory distress.  Neurological: She is alert and oriented to person, place, and time. She exhibits normal muscle tone.  Skin: Skin is warm and dry. She is not diaphoretic.  Psychiatric: She has a normal mood and affect. Her behavior is normal.          Assessment & Plan:

## 2015-03-29 NOTE — Assessment & Plan Note (Signed)
Overview She reports smoking half a pack per day and is interested in quitting. She acknowledges nicotine patches have helped her in the past, and she has a few left over at home.  Assessment Ongoing tobacco use.  Plan -Encouraged her to try nicotine patches in an attempt to stop smoking as a part of management of her asthma -Explained to her the relationship between nicotine and the perception of pain as it can certainly cloud and may limit her response to analgesics to which she was surprised but acknowledged understanding

## 2015-03-29 NOTE — Patient Instructions (Addendum)
Please continue taking 1-2 tablets of tramadol daily with [phone] mg of Tylenol.  Please see me back in 3 months for reassessment.  Thank you.

## 2015-03-29 NOTE — Telephone Encounter (Signed)
Helped patient with inhaler access, issue resolved.

## 2015-03-30 NOTE — Progress Notes (Signed)
Internal Medicine Clinic Attending  Case discussed with Dr. Patel,Rushil at the time of the visit.  We reviewed the resident's history and exam and pertinent patient test results.  I agree with the assessment, diagnosis, and plan of care documented in the resident's note.  

## 2015-04-06 LAB — TOXASSURE SELECT,+ANTIDEPR,UR: PDF: 0

## 2015-04-08 ENCOUNTER — Other Ambulatory Visit: Payer: Self-pay | Admitting: Internal Medicine

## 2015-04-26 ENCOUNTER — Other Ambulatory Visit: Payer: Self-pay | Admitting: Internal Medicine

## 2015-04-26 NOTE — Telephone Encounter (Signed)
Pt requesting tramadol to be filled. °

## 2015-04-27 NOTE — Telephone Encounter (Signed)
Her most recent UDS is appropriate for tramadol, THC but inappropriate for cocaine metabolites, especially cocaethylene which is seen when cocaine and alcohol are ingested concomitantly.  I'm afraid this will limit my ability to refill her tramadol without speaking to her. I attempted to call her today to discuss refill request but was unsuccessful and will try again tomorrow.

## 2015-04-29 NOTE — Telephone Encounter (Signed)
Dr. Posey Pronto, pt has called this morning if you would like to call her back sometime today.  She hung up before the girls could get her to me.

## 2015-05-01 ENCOUNTER — Telehealth: Payer: Self-pay | Admitting: Internal Medicine

## 2015-05-01 DIAGNOSIS — M47816 Spondylosis without myelopathy or radiculopathy, lumbar region: Secondary | ICD-10-CM

## 2015-05-01 DIAGNOSIS — F1021 Alcohol dependence, in remission: Secondary | ICD-10-CM

## 2015-05-01 MED ORDER — TRAMADOL HCL 50 MG PO TABS: 50.0000 mg | ORAL_TABLET | ORAL | Status: DC

## 2015-05-01 NOTE — Telephone Encounter (Signed)
Pt added to schedule to be seen tomorrow

## 2015-05-01 NOTE — Telephone Encounter (Signed)
I tried calling her back on Saturday, 2/18, but have been unable to do so with inpatient. I spoke with her over the phone just now. She acknowledged she had used alcohol and cocaine in the month leading up to her UDS on 03/28/14 which is a violation of the pain contract.  She is open to substance abuse counseling, so I will refer her to Ms. Golden Hurter for social work.   Leigh, please go ahead and call in the prescription for another month. She understands that we need to have a plan in place for the refill in April regarding counseling which should be reiterated by all who speak to her. Please cancel her appointment tomorrow. I apologize for not getting back to her sooner.

## 2015-05-01 NOTE — Telephone Encounter (Signed)
Needs her pain meds refilled, been out for 2 days

## 2015-05-01 NOTE — Telephone Encounter (Signed)
rx phoned in- appointment for tomorrow canceled.

## 2015-05-01 NOTE — Addendum Note (Signed)
Addended by: Riccardo Dubin on: 05/01/2015 03:46 PM   Modules accepted: Orders

## 2015-05-01 NOTE — Telephone Encounter (Signed)
Added to schedule to be seen tomorrow per pt request.  She has been out of medication for 2 days.

## 2015-05-02 ENCOUNTER — Telehealth: Payer: Self-pay | Admitting: Licensed Clinical Social Worker

## 2015-05-02 ENCOUNTER — Encounter: Payer: Medicare Other | Admitting: Internal Medicine

## 2015-05-02 NOTE — Telephone Encounter (Signed)
Tabitha Wilcox was referred to CSW for substance abuse resources.  CSW placed call to Tabitha Wilcox.  Pt states she attended AA several years ago, but recently relapsed.  Tabitha Wilcox relapse placed in her violation of her pain contract.  During our call today, pt states alcohol is not her current issue but marijuana.  Tabitha Wilcox is agreeable to substance abuse treatment and does not have an agency of preference.  Pt is covered under Medicare/Medicaid.  CSW discussed referral to [facility], as they provide substance abuse assessment and link with best treatment program within their facility.  Pt states she is familiar with location of [facility] and would appreciate this worker to complete referral and schedule is possible.  Tabitha Wilcox prefers morning appointments.  CSW completed referral to [facility], pt scheduled for 11am on 05/08/15.  Tabitha Wilcox provided with appointment date/time and agency phone number.  Pt aware CSW will mail information and ROI.

## 2015-05-07 NOTE — Telephone Encounter (Signed)
Please see CSW telephone note on 05/01/05 regarding substance abuse counseling.

## 2015-05-07 NOTE — Telephone Encounter (Signed)
I did see the note but appreciate the reminder none the less.

## 2015-05-17 ENCOUNTER — Other Ambulatory Visit: Payer: Self-pay | Admitting: *Deleted

## 2015-05-17 MED ORDER — MELOXICAM 15 MG PO TABS: 15.0000 mg | ORAL_TABLET | Freq: Every day | ORAL | Status: DC

## 2015-05-17 NOTE — Telephone Encounter (Signed)
Patient seen in Sports Medicine.  Derl Barrow, RN

## 2015-05-28 ENCOUNTER — Other Ambulatory Visit: Payer: Self-pay | Admitting: Internal Medicine

## 2015-05-28 NOTE — Telephone Encounter (Signed)
UDS inappropriate - was + cocaine and THC. IF she questions denial, needs appt

## 2015-05-28 NOTE — Telephone Encounter (Signed)
She was informed, not happy, offered an appt with anyone before April appt, she refused

## 2015-05-28 NOTE — Telephone Encounter (Signed)
Pt requesting tramadol to be filled @ [facility].

## 2015-05-30 ENCOUNTER — Encounter: Payer: Self-pay | Admitting: *Deleted

## 2015-05-30 ENCOUNTER — Other Ambulatory Visit: Payer: Self-pay | Admitting: Family Medicine

## 2015-05-30 DIAGNOSIS — M545 Low back pain: Principal | ICD-10-CM

## 2015-05-30 DIAGNOSIS — G8929 Other chronic pain: Secondary | ICD-10-CM

## 2015-05-30 DIAGNOSIS — M47816 Spondylosis without myelopathy or radiculopathy, lumbar region: Secondary | ICD-10-CM

## 2015-06-04 ENCOUNTER — Ambulatory Visit
Admission: RE | Admit: 2015-06-04 | Discharge: 2015-06-04 | Disposition: A | Discharge status: Home / Self Care | Payer: Medicare Other | Source: Ambulatory Visit | Attending: Family Medicine | Admitting: Family Medicine

## 2015-06-04 DIAGNOSIS — G8929 Other chronic pain: Secondary | ICD-10-CM

## 2015-06-04 DIAGNOSIS — M545 Low back pain: Principal | ICD-10-CM

## 2015-06-04 MED ORDER — METHYLPREDNISOLONE ACETATE 40 MG/ML INJ SUSP (RADIOLOG
120.0000 mg | Freq: Once | INTRAMUSCULAR | Status: AC
  Administered 2015-06-04: 120 mg via EPIDURAL

## 2015-06-04 MED ORDER — IOHEXOL 180 MG/ML  SOLN
1.0000 mL | Freq: Once | INTRAMUSCULAR | Status: AC | PRN
Start: 2015-06-04 — End: 2015-06-04
  Administered 2015-06-04: 1 mL via EPIDURAL

## 2015-06-14 ENCOUNTER — Encounter: Payer: Self-pay | Admitting: Internal Medicine

## 2015-06-14 ENCOUNTER — Ambulatory Visit (INDEPENDENT_AMBULATORY_CARE_PROVIDER_SITE_OTHER): Payer: Medicare Other | Admitting: Internal Medicine

## 2015-06-14 VITALS — BP 127/82 | HR 102 | Temp 98.0°F | Ht 62.0 in | Wt 169.6 lb

## 2015-06-14 DIAGNOSIS — M47816 Spondylosis without myelopathy or radiculopathy, lumbar region: Secondary | ICD-10-CM

## 2015-06-14 DIAGNOSIS — M4696 Unspecified inflammatory spondylopathy, lumbar region: Secondary | ICD-10-CM | POA: Diagnosis not present

## 2015-06-14 MED ORDER — TRAMADOL HCL 50 MG PO TABS: 50.0000 mg | ORAL_TABLET | ORAL | Status: DC

## 2015-06-14 NOTE — Assessment & Plan Note (Addendum)
Overview She reports to me today the tramadol 100 mg in the morning adequately relieves her pain. She has also been taking ibuprofen and Tylenol alongside Celebrex 200 mg daily dose like to simplify her regimen as evidenced by the log that she is produced for me. Upon awakening, she reports her pain is 4/10 in severity that improves to 0/10 following tramadol. With the change of season, she acknowledges that her underlying degenerative disc disease appears to worsen though is determined to exercise. Last summer, she was able to walk 5 miles without problems and is able to walk 1 mile as of now while she regains her stamina takes in part to better control of her pain.  She also endorses the use of alternative therapies for pain control, like herbal tea, omega-3 supplements, allergies, cinnamon and honey and is interested in permanent therapy.  In light her inappropriate UDS, she has followed my recommendations with regards to attending substance abuse counseling through the [facility] 3 times a week along with group meetings. She reports relief in acknowledging her substance abuse and finds it has been relieved a significant burden in her life.  Assessment Lumbar arthropathy well-controlled on tramadol and Celebrex along with alternative therapies. I find her efforts to address her substance abuse promising.  Plan -Encouraged her to continue attending substance abuse counseling -Prescribed tramadol 50mg  60 tablets to be taken 1-2 tablets daily as needed -Cautioned her against the use of ibuprofen along with celecoxib to avoid any GI irritation -Requested the help of RN for aromatherapy -Plan to follow-up in one month to continue reassessing her efforts at overcoming polysubstance abuse

## 2015-06-14 NOTE — Progress Notes (Signed)
   Subjective:    Patient ID: Tabitha Wilcox, female    DOB: 10/10/1955, 60 y.o.   MRN: [id number]  HPI Tabitha Wilcox is a 60 year old female with history of alcohol dependence, depression, multilevel degenerative disc disease, GERD, chronic asthma who presents today with for low back pain follow-up. Please see assessment & plan for documentation of chronic medical problems.    Review of Systems  Constitutional: Negative for appetite change.  Respiratory: Negative for shortness of breath.   Cardiovascular: Negative for chest pain.  Gastrointestinal: Negative for nausea, vomiting and diarrhea.  Neurological: Negative for dizziness and numbness.       Objective:   Physical Exam  Constitutional: She is oriented to person, place, and time. She appears well-developed and well-nourished. No distress.  HENT:  Head: Normocephalic and atraumatic.  Eyes: Conjunctivae are normal. No scleral icterus.  Neck: Normal range of motion. Neck supple. No tracheal deviation present.  Cardiovascular: Normal rate and regular rhythm.   Pulmonary/Chest: Effort normal and breath sounds normal. No stridor. No respiratory distress.  Neurological: She is alert and oriented to person, place, and time. She exhibits normal muscle tone.  Skin: Skin is warm and dry. She is not diaphoretic.  Psychiatric: She has a normal mood and affect. Her behavior is normal.          Assessment & Plan:

## 2015-06-18 NOTE — Progress Notes (Signed)
Case discussed with Dr. Patel at the time of the visit.  We reviewed the resident's history and exam and pertinent patient test results.  I agree with the assessment, diagnosis, and plan of care documented in the resident's note. 

## 2015-06-18 NOTE — Addendum Note (Signed)
Addended by: Oval Linsey D on: 06/18/2015 08:52 AM   Modules accepted: Level of Service

## 2015-07-12 ENCOUNTER — Telehealth: Payer: Self-pay | Admitting: Internal Medicine

## 2015-07-12 NOTE — Telephone Encounter (Signed)
APT. REMINDER CALL, LMTCB °

## 2015-07-15 ENCOUNTER — Ambulatory Visit (INDEPENDENT_AMBULATORY_CARE_PROVIDER_SITE_OTHER): Payer: Medicare Other | Admitting: Internal Medicine

## 2015-07-15 ENCOUNTER — Other Ambulatory Visit (HOSPITAL_COMMUNITY)
Admission: RE | Admit: 2015-07-15 | Discharge: 2015-07-15 | Disposition: A | Discharge status: Home / Self Care | Payer: Medicare Other | Source: Ambulatory Visit | Attending: Internal Medicine | Admitting: Internal Medicine

## 2015-07-15 ENCOUNTER — Encounter: Payer: Self-pay | Admitting: Internal Medicine

## 2015-07-15 VITALS — BP 123/72 | HR 98 | Temp 98.8°F | Ht 62.0 in | Wt 166.7 lb

## 2015-07-15 DIAGNOSIS — L608 Other nail disorders: Secondary | ICD-10-CM | POA: Diagnosis present

## 2015-07-15 DIAGNOSIS — Z79899 Other long term (current) drug therapy: Secondary | ICD-10-CM | POA: Diagnosis not present

## 2015-07-15 DIAGNOSIS — Z1151 Encounter for screening for human papillomavirus (HPV): Secondary | ICD-10-CM | POA: Insufficient documentation

## 2015-07-15 DIAGNOSIS — M4696 Unspecified inflammatory spondylopathy, lumbar region: Secondary | ICD-10-CM | POA: Diagnosis not present

## 2015-07-15 DIAGNOSIS — F1021 Alcohol dependence, in remission: Secondary | ICD-10-CM

## 2015-07-15 DIAGNOSIS — M4726 Other spondylosis with radiculopathy, lumbar region: Secondary | ICD-10-CM | POA: Diagnosis not present

## 2015-07-15 DIAGNOSIS — Z124 Encounter for screening for malignant neoplasm of cervix: Secondary | ICD-10-CM

## 2015-07-15 DIAGNOSIS — Z01419 Encounter for gynecological examination (general) (routine) without abnormal findings: Secondary | ICD-10-CM | POA: Insufficient documentation

## 2015-07-15 DIAGNOSIS — M47816 Spondylosis without myelopathy or radiculopathy, lumbar region: Secondary | ICD-10-CM

## 2015-07-15 MED ORDER — TRAMADOL HCL 50 MG PO TABS: 50.0000 mg | ORAL_TABLET | ORAL | Status: DC

## 2015-07-15 NOTE — Patient Instructions (Signed)
I have refilled your Tramadol for today.  Based off your urine results, please come back in 4 weeks.

## 2015-07-15 NOTE — Assessment & Plan Note (Signed)
Overview She is due for Pap smear today. She does report having an abnormal Pap smear 30 years ago and she was being followed by gynecology though she denies any prior history of colposcopy, loop excision.  Assessment Eligible for cervical cancer screening  Plan Perform Pap smear today

## 2015-07-15 NOTE — Assessment & Plan Note (Signed)
Overview She is tolerating tramadol 50-100 mg daily as needed quite well. Though her pain is well controlled, she still feels she has persistent numbness/tingling down the posterior aspect of her leg to her foot. This sensation is particular worse in the morning when she wakes up and when she goes to sleep at night though does not affect her ability to function throughout the day. She denies any bowel/bladder dysfunction or any worsening of her paresthesias.  Assessment Lumbar arthropathy complicated by radiculopathy as evident on imaging with well-controlled pain.  Plan -Prescribed tramadol 50-100 mg daily as needed 60 tablets -Check UDS today with plan to provide 2 additional paper prescriptions of her medication if it is appropriate

## 2015-07-15 NOTE — Progress Notes (Signed)
Internal Medicine Clinic Attending  Case discussed with Dr. Patel,Rushil at the time of the visit.  We reviewed the resident's history and exam and pertinent patient test results.  I agree with the assessment, diagnosis, and plan of care documented in the resident's note.  

## 2015-07-15 NOTE — Assessment & Plan Note (Signed)
Overview She reports to me today that she has had persistent problems with her right toenail and is wondering she needs to be referred to podiatry. She feels her nail does not heal well and falls off on its own.  Assessment Physical exam findings notable for asymmetric toe nailbed findings.  Plan Referral to podiatry for further management, if any

## 2015-07-15 NOTE — Progress Notes (Signed)
   Subjective:    Patient ID: Tabitha Wilcox, female    DOB: [birth year], 60 y.o.   MRN: [id number]  HPI Tabitha Wilcox is a 60 year old female with history of alcohol dependence, depression, multilevel degenerative disc disease on tramadol therapy, GERD, chronic asthma who presents today with for low back pain follow-up. Please see assessment & plan for documentation of chronic medical problems.  Per review of the [facility], she last refilled tramadol 50 mg to be taken twice daily as needed 60 tablets on 06/17/15.     Review of Systems  Constitutional: Negative for unexpected weight change.  Respiratory: Negative for shortness of breath.   Cardiovascular: Negative for chest pain and leg swelling.  Gastrointestinal: Negative for nausea, vomiting, abdominal pain and diarrhea.  Genitourinary: Negative for dysuria.  Musculoskeletal: Positive for back pain. Negative for gait problem.  Neurological: Positive for numbness. Negative for dizziness.       Objective:   Physical Exam  Constitutional: She is oriented to person, place, and time. She appears well-developed and well-nourished. No distress.  HENT:  Head: Normocephalic and atraumatic.  Eyes: Conjunctivae are normal. No scleral icterus.  Neck: Normal range of motion. Neck supple. No tracheal deviation present.  Cardiovascular: Normal rate and regular rhythm.   Pulmonary/Chest: Effort normal and breath sounds normal. No stridor. No respiratory distress.  Genitourinary: No vaginal discharge found.  Vaginal vault and cervix without lesions.  Musculoskeletal:  Right first toe nailbed notable for decreased length of nail plate as compared to the left first toe nailbed. No pain on palpation.  Neurological: She is alert and oriented to person, place, and time.  2+ patellar reflexes bilaterally. 5 out of 5 hip extension/flexion, leg extension/flexion, full extension/flexion bilaterally though there is a mild degree of weakness on  the left as compared to the right.  Skin: Skin is warm and dry. She is not diaphoretic.  Psychiatric: She has a normal mood and affect. Her behavior is normal.          Assessment & Plan:

## 2015-07-15 NOTE — Assessment & Plan Note (Signed)
Overview She reports completing substance abuse counseling and really appreciates the experience as it has given her increased self-awareness of the dangers of misusing medications. She also feels her pain tolerance as improved in the absence of illicit substance use.  Assessment History of alcohol dependence well-managed on counseling alone  Plan -Encouraged and commended her for her efforts -Check UDS today as noted under "lumbar arthropathy"

## 2015-07-16 ENCOUNTER — Encounter: Payer: Self-pay | Admitting: Internal Medicine

## 2015-07-16 LAB — CYTOLOGY - PAP

## 2015-07-16 NOTE — Addendum Note (Signed)
Addended by: Riccardo Dubin on: 07/16/2015 09:02 AM   Modules accepted: SmartSet

## 2015-07-20 LAB — TOXASSURE SELECT,+ANTIDEPR,UR

## 2015-07-31 ENCOUNTER — Other Ambulatory Visit: Payer: Self-pay | Admitting: Internal Medicine

## 2015-08-02 NOTE — Addendum Note (Signed)
Addended by: Riccardo Dubin on: 08/02/2015 07:11 PM   Modules accepted: Miquel Dunn

## 2015-08-06 ENCOUNTER — Telehealth: Payer: Self-pay | Admitting: Internal Medicine

## 2015-08-06 NOTE — Telephone Encounter (Addendum)
  Reason for call:   I placed an outgoing call to [name] around 11 AM regarding UDS result.   Assessment/ Plan:   She confirmed to me that she had not taken tramadol that morning. Her pain requirement has overall decreased after substance abuse counseling.  In the last week, she has had to use 2 tablets about 3-4 times. She still has enough medication to last her for another couple weeks she feels as she refilled 60 tablets on 07/15/15.  I commended her on her efforts and advised her that we can cut down on the amount that we prescribe but will leave a refill with the front desk.  The only pain that does bother her at night is the radiculopathy for which I told her to continue losing weight and exercising as tolerated.  She looks forward to seeing me back in a few months.    Riccardo Dubin, MD   08/06/2015, 11:11 AM

## 2015-08-19 ENCOUNTER — Ambulatory Visit (INDEPENDENT_AMBULATORY_CARE_PROVIDER_SITE_OTHER): Payer: Medicare Other | Admitting: Sports Medicine

## 2015-08-19 ENCOUNTER — Other Ambulatory Visit: Payer: Self-pay

## 2015-08-19 ENCOUNTER — Encounter: Payer: Self-pay | Admitting: Sports Medicine

## 2015-08-19 ENCOUNTER — Ambulatory Visit (INDEPENDENT_AMBULATORY_CARE_PROVIDER_SITE_OTHER): Payer: Medicare Other

## 2015-08-19 DIAGNOSIS — L603 Nail dystrophy: Secondary | ICD-10-CM

## 2015-08-19 DIAGNOSIS — M79671 Pain in right foot: Secondary | ICD-10-CM

## 2015-08-19 NOTE — Telephone Encounter (Signed)
Pt want to know if pain med is ready for pick up.

## 2015-08-19 NOTE — Progress Notes (Signed)
Patient ID: Tabitha Wilcox, female   DOB: 01-11-1956, 60 y.o.   MRN: [id number] Subjective: Tabitha Wilcox is a 60 y.o. female patient seen today in office with complaint of painful thickened and discolored nail, right big toe nail reports that every year the nail comes off after she traumatized the nail by wearing tight shoes. She wanted to have it looked at to see if she needed anything done. Patient has no other pedal complaints at this time.   Patient Active Problem List   Diagnosis Date Noted  . Hepatitis C antibody test positive 02/08/2015  . Thoracic arthritis ([place]) 12/21/2014  . DJD (degenerative joint disease) of knee 10/20/2014  . Obesity 07/20/2014  . Pre-diabetes 07/20/2014  . Insomnia 07/20/2014  . Cervical spine arthritis ([place]) 03/16/2014  . Family history of glaucoma 10/13/2013  . Toenail deformity 06/14/2013  . Osteopenia 05/08/2013  . Healthcare maintenance 12/19/2012  . Alcohol dependence in remission ([place]) 08/15/2012  . Screening for cervical cancer 05/31/2012  . Special screening for malignant neoplasms, colon 08/19/2011  . Medial meniscus tear 07/14/2011  . Tobacco abuse 04/13/2011  . Knee pain, bilateral 03/31/2011  . Ankle pain 03/12/2011  . Esophageal stricture 03/12/2011  . Asthma, chronic 12/26/2010  . GERD (gastroesophageal reflux disease) 12/26/2010  . Carpal tunnel syndrome on both sides   . Rotator cuff injury   . Lumbar arthropathy ([place]) 02/08/2009  . DEPRESSION 12/27/2008    Current Outpatient Prescriptions on File Prior to Visit  Medication Sig Dispense Refill  . acetaminophen (TYLENOL) 500 MG tablet Take 2 tablets (1,000 mg total) by mouth every 8 (eight) hours as needed. 100 tablet 0  . albuterol (VENTOLIN HFA) 108 (90 BASE) MCG/ACT inhaler Inhale 2 puffs into the lungs every 6 hours as needed. 8.5 g 12  . CALCIUM PO Take 1 tablet by mouth daily.    . celecoxib (CELEBREX) 100 MG capsule Take 2 capsules (200 mg total) by mouth daily. 60 capsule 5   . Cyanocobalamin (VITAMIN B-12 PO) Take 1 capsule by mouth daily. Reported on 03/06/2015    . diclofenac sodium (VOLTAREN) 1 % GEL Place a 2 grams of gell onto the skin over affected area twice a day as needed and discontinue if rash 100 g 12  . fish oil-omega-3 fatty acids 1000 MG capsule Take by mouth daily.    . meloxicam (MOBIC) 15 MG tablet Take 1 tablet (15 mg total) by mouth daily. 30 tablet 5  . methocarbamol (ROBAXIN) 500 MG tablet Take 1-2 tablets (500-1,000 mg total) by mouth every 6 (six) hours as needed for muscle spasms. (Patient not taking: Reported on 03/06/2015) 60 tablet 0  . Multiple Vitamins-Minerals (HAIR/SKIN/NAILS PO) Take 1 tablet by mouth daily.    . nicotine (NICODERM CQ - DOSED IN MG/24 HOURS) 14 mg/24hr patch Place 1 patch (14 mg total) onto the skin daily. (Patient not taking: Reported on 03/06/2015) 21 patch 1  . omeprazole (PRILOSEC) 20 MG capsule Take 1 capsule (20 mg total) by mouth daily before lunch. 90 capsule 4  . traMADol (ULTRAM) 50 MG tablet Take 1-2 tablets (50-100 mg total) by mouth daily. 60 tablet 0   No current facility-administered medications on file prior to visit.    No Known Allergies  Objective: Physical Exam  General: Well developed, nourished, no acute distress, awake, alert and oriented x 3  Vascular: Dorsalis pedis artery 2/4 bilateral, Posterior tibial artery 2/4 bilateral, skin temperature warm to warm proximal to distal bilateral lower extremities,  no varicosities, pedal hair present bilateral.  Neurological: Gross sensation present via light touch bilateral.   Dermatological: Skin is warm, dry, and supple bilateral, Nails 1-10 are short thick, and discolored with mild subungal debris with most involved nail right hallux with trauma line present, no webspace macerations present bilateral, no open lesions present bilateral, no callus/corns/hyperkeratotic tissue present bilateral. No signs of infection bilateral.  Musculoskeletal: No  boney deformities noted bilateral. Muscular strength within normal limits without painon range of motion. No pain with calf compression bilateral.  Xray, Right foot- Normal osseous mineralization, + calcaneal spur, no fracture/dislocation, no exostosis at hallux, no other acute findings  Assessment and Plan:  Problem List Items Addressed This Visit    None    Visit Diagnoses    Right foot pain    -  Primary    Relevant Orders    DG Foot 2 Views Right    Nail dystrophy          -Examined patient -X-rays reviewed -Discussed treatment options for painful dystrophic nail right hallux -Patient declined treatment at this time states that she will continue to trim and monitor closely  -Patient to return as needed for follow up evaluation or sooner if symptoms worsen.  Landis Martins, DPM

## 2015-08-20 MED ORDER — TRAMADOL HCL 50 MG PO TABS: 50.0000 mg | ORAL_TABLET | ORAL | Status: DC

## 2015-08-20 NOTE — Telephone Encounter (Signed)
As this patient was seen by me and deemed necessary for their treatment, I will refill this prescription for tramadol 50mg  x 60 tablets to be taken 1-2 daily as needed. Per review of the [place] DEA database, this medication was last refilled 07/15/15 and has not been refilled over the time interval.  As her next appointment isn't until early August, I will provide her with two prescriptions. Most recent UDS was appropriate [see last office visit].

## 2015-08-20 NOTE — Telephone Encounter (Signed)
Called into pharmacy, patient informed

## 2015-08-26 ENCOUNTER — Telehealth: Payer: Self-pay | Admitting: *Deleted

## 2015-08-26 NOTE — Telephone Encounter (Signed)
Rhea I have already seen her so OK to set up [facility]! Dorcas Mcmurray

## 2015-08-26 NOTE — Telephone Encounter (Signed)
-----   Message from Carolyne Littles sent at 08/26/2015  9:19 AM EDT ----- Regarding: order request Contact: [phone] Pt is asking for order put into epic to get back injections at [facility].

## 2015-08-26 NOTE — Telephone Encounter (Signed)
Did you want me to set this up, or does she need an appointment with you first?

## 2015-08-28 ENCOUNTER — Other Ambulatory Visit: Payer: Self-pay | Admitting: *Deleted

## 2015-08-28 DIAGNOSIS — M47816 Spondylosis without myelopathy or radiculopathy, lumbar region: Secondary | ICD-10-CM

## 2015-08-29 ENCOUNTER — Other Ambulatory Visit: Payer: Self-pay | Admitting: *Deleted

## 2015-08-29 ENCOUNTER — Other Ambulatory Visit: Payer: Self-pay | Admitting: Family Medicine

## 2015-08-29 DIAGNOSIS — M545 Low back pain: Principal | ICD-10-CM

## 2015-08-29 DIAGNOSIS — G8929 Other chronic pain: Secondary | ICD-10-CM

## 2015-09-02 ENCOUNTER — Ambulatory Visit
Admission: RE | Admit: 2015-09-02 | Discharge: 2015-09-02 | Disposition: A | Discharge status: Home / Self Care | Payer: Medicare Other | Source: Ambulatory Visit | Attending: Family Medicine | Admitting: Family Medicine

## 2015-09-02 DIAGNOSIS — G8929 Other chronic pain: Secondary | ICD-10-CM

## 2015-09-02 DIAGNOSIS — M545 Low back pain, unspecified: Secondary | ICD-10-CM

## 2015-09-02 DIAGNOSIS — M5126 Other intervertebral disc displacement, lumbar region: Secondary | ICD-10-CM | POA: Diagnosis not present

## 2015-09-02 MED ORDER — IOPAMIDOL (ISOVUE-M 200) INJECTION 41%
1.0000 mL | Freq: Once | INTRAMUSCULAR | Status: AC
  Administered 2015-09-02: 1 mL via EPIDURAL

## 2015-09-02 MED ORDER — METHYLPREDNISOLONE ACETATE 40 MG/ML INJ SUSP (RADIOLOG
120.0000 mg | Freq: Once | INTRAMUSCULAR | Status: AC
  Administered 2015-09-02: 120 mg via EPIDURAL

## 2015-09-02 NOTE — Discharge Instructions (Signed)

## 2015-09-16 ENCOUNTER — Other Ambulatory Visit: Payer: Self-pay | Admitting: Internal Medicine

## 2015-09-16 NOTE — Telephone Encounter (Signed)
As this patient was seen by me and deemed necessary for their treatment, I will refill this prescription for tramadol 50mg  x 60 tablets to be taken 1 tablet every 12 hours as needed. Per review of the [place] DEA database, this medication was last refilled 08/20/15 and has not been refilled over the time interval.

## 2015-09-17 NOTE — Telephone Encounter (Signed)
Rx called into pharmacy, pharmacy to contact patient.Despina Hidden Cassady7/11/201711:39 AM

## 2015-10-01 ENCOUNTER — Other Ambulatory Visit: Payer: Self-pay | Admitting: Internal Medicine

## 2015-10-01 NOTE — Telephone Encounter (Signed)
celecoxib (CELEBREX) 100 MG capsule [facility]

## 2015-10-03 MED ORDER — CELECOXIB 100 MG PO CAPS: 200.0000 mg | ORAL_CAPSULE | Freq: Every day | ORAL | 5 refills | Status: DC

## 2015-10-03 NOTE — Telephone Encounter (Signed)
As this patient was seen by me and deemed necessary for their treatment, I will refill this prescription for celecoxib.

## 2015-10-18 ENCOUNTER — Ambulatory Visit (INDEPENDENT_AMBULATORY_CARE_PROVIDER_SITE_OTHER): Payer: Medicare Other | Admitting: Internal Medicine

## 2015-10-18 ENCOUNTER — Encounter: Payer: Self-pay | Admitting: Internal Medicine

## 2015-10-18 ENCOUNTER — Other Ambulatory Visit (HOSPITAL_COMMUNITY)
Admission: RE | Admit: 2015-10-18 | Discharge: 2015-10-18 | Disposition: A | Discharge status: Home / Self Care | Payer: Medicare Other | Source: Ambulatory Visit | Attending: Internal Medicine | Admitting: Internal Medicine

## 2015-10-18 VITALS — BP 139/74 | HR 91 | Temp 98.3°F | Ht 62.0 in | Wt 164.3 lb

## 2015-10-18 DIAGNOSIS — M47816 Spondylosis without myelopathy or radiculopathy, lumbar region: Secondary | ICD-10-CM

## 2015-10-18 DIAGNOSIS — M4726 Other spondylosis with radiculopathy, lumbar region: Secondary | ICD-10-CM

## 2015-10-18 DIAGNOSIS — Z1151 Encounter for screening for human papillomavirus (HPV): Secondary | ICD-10-CM | POA: Diagnosis not present

## 2015-10-18 DIAGNOSIS — Z720 Tobacco use: Secondary | ICD-10-CM

## 2015-10-18 DIAGNOSIS — Z01419 Encounter for gynecological examination (general) (routine) without abnormal findings: Secondary | ICD-10-CM | POA: Insufficient documentation

## 2015-10-18 DIAGNOSIS — F17210 Nicotine dependence, cigarettes, uncomplicated: Secondary | ICD-10-CM | POA: Diagnosis not present

## 2015-10-18 DIAGNOSIS — Z124 Encounter for screening for malignant neoplasm of cervix: Secondary | ICD-10-CM

## 2015-10-18 MED ORDER — DULOXETINE HCL 30 MG PO CPEP: 30.0000 mg | ORAL_CAPSULE | Freq: Every day | ORAL | 0 refills | Status: DC

## 2015-10-18 MED ORDER — TRAMADOL HCL 50 MG PO TABS: 50.0000 mg | ORAL_TABLET | Freq: Every day | ORAL | 0 refills | Status: DC | PRN

## 2015-10-18 NOTE — Patient Instructions (Addendum)
For your back pain, try these medications: -Tramadol 1-2 tablet every day as needed -Cymbalta 1 tablet every day -Celebrex 2 tablets every day -Tylenol as needed BUT remember not to take more than 3000mg  [500mg  x 6 tablets] a day.  Let's see each other back in November, but I will call to see how you are doing in September on this new regimen.

## 2015-10-18 NOTE — Progress Notes (Signed)
   CC: sciatica  HPI:  [name] is a 60 y.o. female with sciatica. Please see assessment & plan for status of chronic medical problems.   Past Medical History:  Diagnosis Date  . Alcoholism ([place])   . Anxiety   . Arthritis   . Asthma   . Carpal tunnel syndrome on both sides   . Depression   . GERD (gastroesophageal reflux disease)   . Rotator cuff injury    Right side  . Sciatica   . Shortness of breath   . Tobacco abuse   . Ulcer    1990's    Review of Systems:  Please see each problem below for a pertinent review of systems.  Physical Exam:  Vitals:   10/18/15 1320  BP: 139/74  Pulse: 91  Temp: 98.3 F (36.8 C)  TempSrc: Oral  Weight: 164 lb 4.8 oz (74.5 kg)   Physical Exam  Constitutional: She is oriented to person, place, and time. No distress.  HENT:  Head: Normocephalic and atraumatic.  Eyes: Conjunctivae are normal. No scleral icterus.  Cardiovascular: Normal rate and regular rhythm.   Pulmonary/Chest: Effort normal. No respiratory distress.  Genitourinary:  Genitourinary Comments: No lesions were noted in the vaginal vault or on the cervix. Scant physiologic discharge was noted.  Neurological: She is alert and oriented to person, place, and time.  Skin: She is not diaphoretic.       Assessment & Plan:   See Encounters Tab for problem based charting.  Patient discussed with Dr. Dareen Piano

## 2015-10-20 NOTE — Assessment & Plan Note (Addendum)
Assessment Though it is no longer her back pain which is bothersome to her, she is requesting relief from her chronic sciatic pain which she describes as a shooting burning pain that radiates from pain right buttock down her posterior thigh. Her current regimen includes tramadol 50 mg 1-2 tablets in the morning, acetaminophen 500 mg 2 tablets in the morning, celecoxib 200 mg in the morning, breakthrough acetaminophen 500 mg 3 tablets in the afternoon, Advil PM 2 tablets in the evening. She does not report any symptoms of gastritis, like heartburn, change in appetite, chest pain, which might be suggestive of NSAID toxicity.  Per the [facility], she last refilled tramadol 50 mg 60 tablets to be taken 1-2 times daily as needed on 09/16/15. She denies taking this medication with alcohol or other illicit substances in the interval since her last office visit with me.  While we could up titrate her tramadol to up to 3 tablets daily, I may not be available next month to reassess her pain. Duloxetine may be of some benefit to her though the evidence is not strong. .  Plan -Prescribed duloxetine 30 mg to be taken daily -Prescribed tramadol 50 mg x 60 tablets to be taken 1-2 times daily as needed. I provided her with 3 refills which should last her until her next follow-up with me in November. -Continue celecoxib 200 mg daily -Recommended she take acetaminophen as needed for breakthrough pain though no more than 3000 mg and 1 day -Recommended she switch Advil PM 2 Tylenol PM which is essentially acetaminophen with diphenhydramine given the risks of NSAID toxicity -Commended her on her 4 pound weight loss from last visit and encouraged her to keep up her efforts  ADDENDUM 11/06/2015  9:12 AM:  As this patient was seen by me and deemed necessary for their treatment, I will refill this prescription for duloxetine. I spoke with the patient by phone, and she finds this medicine quite helpful.  She takes it in the morning with her celecoxib and tramadol. She has cut back her acetaminophen used to now 500 mg 3/day. She is agreeable to increasing her duloxetine dose to 60 mg though I will prescribe 30 mg tablets so that she may cut back if she notices any adverse effects on an increased dose. If she tolerates the 60 mg well, I will prescribe 60 mg tablets in one month. She is agreeable to this and appreciates my phone call this morning.

## 2015-10-20 NOTE — Assessment & Plan Note (Addendum)
Assessment She is currently smoking <1/2 pack/day. She had said her quit date for July 15 though has been unable to follow through has her daughter's husband attempted suicide earlier this month. She has been under a lot of stress recently and acknowledges coping with healthy behaviors, like going to the beach with friends, socializing, physical activity. She denies coping chemically with alcohol or illicit substances. She also does not report worsening mood or suicidal ideation which would be concerning for untreated mood disorder.  Plan -Offered her support as she narrated to me her current ordeal -Recommended she continue trying

## 2015-10-20 NOTE — Assessment & Plan Note (Addendum)
Assessment Her last Pap smear, I did not collect sufficient sample from the transformation zone. As such, we will repeat Pap smear today.  Plan Perform Pap smear  ADDENDUM 10/30/2015  5:17 PM:  Past reassuring for no signs of malignancy. Negative for high-risk HPV. Will send her result by mail.

## 2015-10-21 NOTE — Progress Notes (Signed)
[facility] Attending  Case discussed with Dr. Patel,Rushil soon after the resident saw the patient.  We reviewed the resident's history and exam and pertinent patient test results.  I agree with the assessment, diagnosis, and plan of care documented in the resident's note. 

## 2015-10-22 LAB — CYTOLOGY - PAP

## 2015-10-30 ENCOUNTER — Encounter: Payer: Self-pay | Admitting: Internal Medicine

## 2015-11-05 ENCOUNTER — Other Ambulatory Visit: Payer: Self-pay | Admitting: Internal Medicine

## 2015-11-06 NOTE — Telephone Encounter (Signed)
As this patient was seen by me and deemed necessary for their treatment, I will refill this prescription for duloxetine. I spoke with the patient by phone, and she finds this medicine quite helpful. She takes it in the morning with her celecoxib and tramadol. She has cut back her acetaminophen used to now 500 mg 3 a day. She is agreeable to increasing her duloxetine dose to 60 mg though I will prescribe 30 mg tablets so that she may cut back if she notices any adverse effects on an increased dose. If she tolerates the 60 mg well, I will prescribe 60 mg tablets in one month. She is agreeable to this and appreciates my phone call this morning.

## 2015-11-12 DIAGNOSIS — H40013 Open angle with borderline findings, low risk, bilateral: Secondary | ICD-10-CM | POA: Diagnosis not present

## 2015-11-12 DIAGNOSIS — H04123 Dry eye syndrome of bilateral lacrimal glands: Secondary | ICD-10-CM | POA: Diagnosis not present

## 2015-11-27 ENCOUNTER — Telehealth: Payer: Self-pay | Admitting: *Deleted

## 2015-11-27 NOTE — Telephone Encounter (Signed)
Patient would like another back injection.  Let me know if this is ok to setup

## 2015-11-27 NOTE — Telephone Encounter (Signed)
-----   Message from Carolyne Littles sent at 11/27/2015 11:38 AM EDT ----- Regarding: order request Contact: [phone] Pt is asking for another back injection at [facility].

## 2015-11-29 NOTE — Telephone Encounter (Signed)
[facility] by me. I am not sure how often they do them. THANKS! Dorcas Mcmurray

## 2015-12-02 ENCOUNTER — Other Ambulatory Visit: Payer: Self-pay | Admitting: *Deleted

## 2015-12-02 DIAGNOSIS — M47816 Spondylosis without myelopathy or radiculopathy, lumbar region: Secondary | ICD-10-CM

## 2015-12-04 ENCOUNTER — Other Ambulatory Visit: Payer: Self-pay | Admitting: Family Medicine

## 2015-12-04 DIAGNOSIS — M47816 Spondylosis without myelopathy or radiculopathy, lumbar region: Secondary | ICD-10-CM

## 2015-12-10 ENCOUNTER — Other Ambulatory Visit: Payer: Self-pay | Admitting: Internal Medicine

## 2015-12-11 ENCOUNTER — Ambulatory Visit
Admission: RE | Admit: 2015-12-11 | Discharge: 2015-12-11 | Disposition: A | Discharge status: Home / Self Care | Payer: Medicare Other | Source: Ambulatory Visit | Attending: Family Medicine | Admitting: Family Medicine

## 2015-12-11 ENCOUNTER — Other Ambulatory Visit: Payer: Self-pay | Admitting: Family Medicine

## 2015-12-11 DIAGNOSIS — M47817 Spondylosis without myelopathy or radiculopathy, lumbosacral region: Secondary | ICD-10-CM | POA: Diagnosis not present

## 2015-12-11 DIAGNOSIS — M47816 Spondylosis without myelopathy or radiculopathy, lumbar region: Secondary | ICD-10-CM

## 2015-12-11 MED ORDER — METHYLPREDNISOLONE ACETATE 40 MG/ML INJ SUSP (RADIOLOG
120.0000 mg | Freq: Once | INTRAMUSCULAR | Status: AC
  Administered 2015-12-11: 120 mg via EPIDURAL

## 2015-12-11 MED ORDER — IOPAMIDOL (ISOVUE-M 200) INJECTION 41%
1.0000 mL | Freq: Once | INTRAMUSCULAR | Status: AC
  Administered 2015-12-11: 1 mL via EPIDURAL

## 2015-12-11 NOTE — Telephone Encounter (Signed)
As this patient was seen by me and deemed necessary for their treatment, I will refill this prescription for duloxetine.

## 2015-12-11 NOTE — Discharge Instructions (Signed)

## 2015-12-24 ENCOUNTER — Other Ambulatory Visit: Payer: Self-pay | Admitting: *Deleted

## 2015-12-24 ENCOUNTER — Other Ambulatory Visit: Payer: Self-pay | Admitting: Family Medicine

## 2015-12-24 DIAGNOSIS — M47816 Spondylosis without myelopathy or radiculopathy, lumbar region: Secondary | ICD-10-CM

## 2016-01-10 ENCOUNTER — Other Ambulatory Visit: Payer: Self-pay | Admitting: Internal Medicine

## 2016-01-10 DIAGNOSIS — Z720 Tobacco use: Secondary | ICD-10-CM

## 2016-01-16 ENCOUNTER — Telehealth: Payer: Self-pay | Admitting: Internal Medicine

## 2016-01-16 NOTE — Telephone Encounter (Signed)
APT. REMINDER CALL, LMTCB °

## 2016-01-17 ENCOUNTER — Encounter: Payer: Self-pay | Admitting: Internal Medicine

## 2016-01-17 ENCOUNTER — Encounter: Payer: Medicare Other | Admitting: Internal Medicine

## 2016-02-03 ENCOUNTER — Other Ambulatory Visit: Payer: Self-pay | Admitting: Family Medicine

## 2016-02-03 ENCOUNTER — Other Ambulatory Visit: Payer: Self-pay | Admitting: *Deleted

## 2016-02-03 NOTE — Telephone Encounter (Signed)
Did you want to refill this?

## 2016-02-24 ENCOUNTER — Other Ambulatory Visit: Payer: Self-pay | Admitting: Internal Medicine

## 2016-03-08 ENCOUNTER — Encounter: Payer: Self-pay | Admitting: Internal Medicine

## 2016-03-11 ENCOUNTER — Other Ambulatory Visit: Payer: Self-pay | Admitting: Internal Medicine

## 2016-03-11 DIAGNOSIS — K219 Gastro-esophageal reflux disease without esophagitis: Secondary | ICD-10-CM

## 2016-03-12 NOTE — Telephone Encounter (Signed)
I will refill for 90-day supply to last her until our follow-up appointment. She was to follow-up in November for refill of Tramadol but did not.

## 2016-03-13 ENCOUNTER — Ambulatory Visit: Payer: Medicare Other | Admitting: Family Medicine

## 2016-03-20 ENCOUNTER — Encounter: Payer: Self-pay | Admitting: Family Medicine

## 2016-03-20 ENCOUNTER — Ambulatory Visit (INDEPENDENT_AMBULATORY_CARE_PROVIDER_SITE_OTHER): Payer: Medicare Other | Admitting: Family Medicine

## 2016-03-20 VITALS — BP 130/74 | Ht 62.0 in | Wt 160.0 lb

## 2016-03-20 DIAGNOSIS — M4696 Unspecified inflammatory spondylopathy, lumbar region: Secondary | ICD-10-CM | POA: Diagnosis not present

## 2016-03-20 DIAGNOSIS — M47816 Spondylosis without myelopathy or radiculopathy, lumbar region: Secondary | ICD-10-CM

## 2016-03-20 DIAGNOSIS — M5417 Radiculopathy, lumbosacral region: Secondary | ICD-10-CM

## 2016-03-20 MED ORDER — MELOXICAM 15 MG PO TABS
15.0000 mg | ORAL_TABLET | Freq: Every day | ORAL | 3 refills | Status: DC
Start: 2016-03-20 — End: 2016-07-30

## 2016-03-20 NOTE — Progress Notes (Signed)
    CHIEF COMPLAINT / HPI:   Follow-up back pain. In general it is getting worse with more right lower extremity pain. Pain is 5-6 on the time and sometimes will be is that is 8 or 9 out of 10. Pain radiates all the way down the back of her leg to the sole of her foot. She's also had a few episodes where the leg did not work as well and she almost fell. She started using a cane. She has gotten some epidural steroid injections and they helped a fair amount initially but the most recent one did not seem to help as much or for as long.  REVIEW OF SYSTEMS:  See history of present illness above. No fever. No unusual weight change.  OBJECTIVE:  Vital signs are reviewed.   GEN.: Well-developed female no acute distress BACK: Nontender to palpation or percussion. She has full range of motion flexion and hyperextension at the hips periods Neuro: Straight leg raise on the right is positive. DTRs at the knee 1-2+ bilaterally symmetrical. DTR Achilles 1+ in the right 2+ on the left. MSK: Decreased strength on the right compared with the left at hip flexion, knee extension, plantarflexion.  IMAGING I reviewed her MRI from 2015.  ASSESSMENT / PLAN: Lumbar radiculopathy. She had done fairly well with epidural steroid injections in till has some relief from them but they're not adequately treating her symptoms. Since the last MRI was more than 60 years old I think we need to update that. Once we get those results I will contact her. It may be time for her to seek further evaluation from neurosurgery although she would like to avoid surgery.  With her worsening symptoms and occasional fall I think we may be haeaded in that direction. For pain relief today I refilled her mobic. She says her PCP checks her kidney function once a year and she has appointment next month so she is sure he will check it then. Her last kidney function was normal.

## 2016-03-21 ENCOUNTER — Other Ambulatory Visit: Payer: Medicare Other

## 2016-03-23 ENCOUNTER — Emergency Department (HOSPITAL_COMMUNITY)
Admission: EM | Admit: 2016-03-23 | Discharge: 2016-03-23 | Disposition: A | Discharge status: Home / Self Care | Payer: Medicare Other | Attending: Emergency Medicine | Admitting: Emergency Medicine

## 2016-03-23 ENCOUNTER — Encounter (HOSPITAL_COMMUNITY): Payer: Self-pay

## 2016-03-23 DIAGNOSIS — F17210 Nicotine dependence, cigarettes, uncomplicated: Secondary | ICD-10-CM | POA: Insufficient documentation

## 2016-03-23 DIAGNOSIS — J45909 Unspecified asthma, uncomplicated: Secondary | ICD-10-CM | POA: Insufficient documentation

## 2016-03-23 DIAGNOSIS — M545 Low back pain: Secondary | ICD-10-CM | POA: Diagnosis present

## 2016-03-23 DIAGNOSIS — M5441 Lumbago with sciatica, right side: Secondary | ICD-10-CM | POA: Diagnosis not present

## 2016-03-23 DIAGNOSIS — M5489 Other dorsalgia: Secondary | ICD-10-CM | POA: Diagnosis not present

## 2016-03-23 MED ORDER — DIAZEPAM 5 MG PO TABS
5.0000 mg | ORAL_TABLET | Freq: Once | ORAL | Status: AC
Start: 2016-03-23 — End: 2016-03-23
  Administered 2016-03-23: 5 mg via ORAL
  Filled 2016-03-23: qty 1

## 2016-03-23 MED ORDER — OXYCODONE-ACETAMINOPHEN 5-325 MG PO TABS
2.0000 | ORAL_TABLET | Freq: Once | ORAL | Status: AC
  Administered 2016-03-23: 2 via ORAL
  Filled 2016-03-23: qty 2

## 2016-03-23 MED ORDER — KETOROLAC TROMETHAMINE 60 MG/2ML IM SOLN
60.0000 mg | Freq: Once | INTRAMUSCULAR | Status: AC
  Administered 2016-03-23: 60 mg via INTRAMUSCULAR
  Filled 2016-03-23: qty 2

## 2016-03-23 MED ORDER — CYCLOBENZAPRINE HCL 10 MG PO TABS: 10.0000 mg | ORAL_TABLET | Freq: Three times a day (TID) | ORAL | 0 refills | Status: DC | PRN

## 2016-03-23 NOTE — ED Notes (Signed)
PT comfortable with d/c and f/u instructions. Rx x1

## 2016-03-23 NOTE — ED Provider Notes (Signed)
[facility] DEPT Provider Note   CSN: [id number] Arrival date & time: 03/23/16  1117     History   Chief Complaint Chief Complaint  Patient presents with  . Back Pain    HPI Tabitha Wilcox is a 61 y.o. female.  HPI Patient reports ongoing low back pain over the past several days.  She has long-standing history of chronic recurrent low back pain for which she receives epidural injections.  She states her last injection did not seem to help and she's having progressive pain in her back.  Her primary care doctor as scheduled her for an MRI in 2 days but she's had been having difficulty controlling her pain since then.  She denies weakness of her lower extremity as.  No bowel or bladder incontinence.  Denies fevers and chills.  No abdominal pain.  Reports right low back pain with radiation down her right gluteal region.  She's tried anti-inflammatories without improvement in her symptoms.  She is not on any opioid medication or muscle relaxants.   Past Medical History:  Diagnosis Date  . Alcoholism ([place])   . Anxiety   . Arthritis   . Asthma   . Carpal tunnel syndrome on both sides   . Depression   . GERD (gastroesophageal reflux disease)   . Rotator cuff injury    Right side  . Sciatica   . Shortness of breath   . Tobacco abuse   . Ulcer [facility])    1990's    Patient Active Problem List   Diagnosis Date Noted  . Opioid dependence ([place]) 03/08/2016  . Hepatitis C antibody test positive 02/08/2015  . Thoracic arthritis ([place]) 12/21/2014  . DJD (degenerative joint disease) of knee 10/20/2014  . Obesity 07/20/2014  . Pre-diabetes 07/20/2014  . Insomnia 07/20/2014  . Cervical spine arthritis ([place]) 03/16/2014  . Family history of glaucoma 10/13/2013  . Toenail deformity 06/14/2013  . Osteopenia 05/08/2013  . Healthcare maintenance 12/19/2012  . Alcohol dependence in remission ([place]) 08/15/2012  . Screening for cervical cancer 05/31/2012  . Special screening for malignant  neoplasms, colon 08/19/2011  . Medial meniscus tear 07/14/2011  . Tobacco abuse 04/13/2011  . Knee pain, bilateral 03/31/2011  . Ankle pain 03/12/2011  . Esophageal stricture 03/12/2011  . Asthma, chronic 12/26/2010  . GERD (gastroesophageal reflux disease) 12/26/2010  . Carpal tunnel syndrome on both sides   . Rotator cuff injury   . Lumbar arthropathy ([place]) 02/08/2009  . DEPRESSION 12/27/2008    Past Surgical History:  Procedure Laterality Date  . APPENDECTOMY    . IRRIGATION AND DEBRIDEMENT ABSCESS Right 07/05/2012   Procedure: IRRIGATION AND DEBRIDEMENT RIGHT LABIAL/THIGH ABSCESS;  Surgeon: Harl Bowie, MD;  Location: [place] [place];  Service: General;  Laterality: Right;  . UPPER GASTROINTESTINAL ENDOSCOPY      OB History    No data available       Home Medications    Prior to Admission medications   Medication Sig Start Date End Date Taking? Authorizing Provider  CALCIUM PO Take 1 tablet by mouth daily.    Historical Provider, MD  celecoxib (CELEBREX) 100 MG capsule Take 2 capsules (200 mg total) by mouth daily. 02/24/16   Nischal Narendra, MD  Cyanocobalamin (VITAMIN B-12 PO) Take 1 capsule by mouth daily. Reported on 03/06/2015    Historical Provider, MD  cyclobenzaprine (FLEXERIL) 10 MG tablet Take 1 tablet (10 mg total) by mouth 3 (three) times daily as needed for muscle spasms. 03/23/16   Lennette Bihari  Anina Schnake, MD  DULoxetine (CYMBALTA) 60 MG capsule Take 1 capsule (60 mg total) by mouth daily. 02/24/16   Aldine Contes, MD  fish oil-omega-3 fatty acids 1000 MG capsule Take by mouth daily.    Historical Provider, MD  meloxicam (MOBIC) 15 MG tablet Take 1 tablet (15 mg total) by mouth daily. 03/20/16   Dickie La, MD  Multiple Vitamins-Minerals (HAIR/SKIN/NAILS PO) Take 1 tablet by mouth daily.    Historical Provider, MD  nicotine (NICODERM CQ - DOSED IN MG/24 HOURS) 14 mg/24hr patch Place 1 patch (14 mg total) onto the skin daily. Patient not taking: Reported on 03/06/2015  01/07/14   Riccardo Dubin, MD  omeprazole (PRILOSEC) 20 MG capsule Take 1 capsule (20 mg total) by mouth daily before lunch. 03/12/16   Riccardo Dubin, MD  traMADol (ULTRAM) 50 MG tablet Take 1-2 tablets (50-100 mg total) by mouth daily as needed. 10/18/15   Riccardo Dubin, MD  VENTOLIN HFA 108 (90 Base) MCG/ACT inhaler Inhale 2 puffs into the lungs every 6 hours as needed. (200/8=25) 01/10/16   Riccardo Dubin, MD  VOLTAREN 1 % GEL Place 2 grams of gel onto the skin over affected area twice a day as needed and discontinue if rash 02/03/16   Dickie La, MD    Family History Family History  Problem Relation Age of Onset  . Colon cancer Maternal Aunt   . Colon cancer Paternal Uncle     Social History Social History  Substance Use Topics  . Smoking status: Current Every Day Smoker    Packs/day: 0.30    Years: 25.00    Types: Cigarettes  . Smokeless tobacco: Never Used     Comment: Currently smoking 1/4-1 pack per day depending on how stressed she is.  . Alcohol use 7.2 oz/week    12 Cans of beer per week     Comment: Martini sometimes.     Allergies   Patient has no known allergies.   Review of Systems Review of Systems  All other systems reviewed and are negative.    Physical Exam Updated Vital Signs BP 116/74   Pulse 95   SpO2 91%   Physical Exam  Constitutional: She is oriented to person, place, and time. She appears well-developed and well-nourished.  HENT:  Head: Normocephalic.  Eyes: EOM are normal.  Neck: Normal range of motion.  Pulmonary/Chest: Effort normal.  Abdominal: She exhibits no distension.  Musculoskeletal: Normal range of motion.  Mild paralumbar tenderness on the right and mild tenderness in the right sciatic groove.  No weakness of the right lower extremity as compared to left.  Normal pulses in the right foot.  No swelling of the right lower extremity.  Full range of motion of right knee and right hip.  Neurological: She is alert and oriented to  person, place, and time.  Psychiatric: She has a normal mood and affect.  Nursing note and vitals reviewed.    ED Treatments / Results  Labs (all labs ordered are listed, but only abnormal results are displayed) Labs Reviewed - No data to display  EKG  EKG Interpretation None       Radiology No results found.  Procedures Procedures (including critical care time)  Medications Ordered in ED Medications  ketorolac (TORADOL) injection 60 mg (60 mg Intramuscular Given 03/23/16 1143)  oxyCODONE-acetaminophen (PERCOCET/ROXICET) 5-325 MG per tablet 2 tablet (2 tablets Oral Given 03/23/16 1143)  diazepam (VALIUM) tablet 5 mg (5 mg Oral Given  03/23/16 1143)     Initial Impression / Assessment and Plan / ED Course  I have reviewed the triage vital signs and the nursing notes.  Pertinent labs & imaging results that were available during my care of the patient were reviewed by me and considered in my medical decision making (see chart for details).  Clinical Course     Likely sciatica.  Pain improved in the emergency department.  I agree with the ongoing management plan for MRI as an outpatient.  The MRI does not need to be completed emergently today.  Home with a prescription for muscle relaxants.  She'll need to talk with her doctors about additional medication.    Final Clinical Impressions(s) / ED Diagnoses   Final diagnoses:  Acute right-sided low back pain with right-sided sciatica    New Prescriptions New Prescriptions   CYCLOBENZAPRINE (FLEXERIL) 10 MG TABLET    Take 1 tablet (10 mg total) by mouth 3 (three) times daily as needed for muscle spasms.     Jola Schmidt, MD 03/23/16 [phone]

## 2016-03-23 NOTE — ED Triage Notes (Signed)
Patient transported via [facility]. EMS reports the patient has had a "flare up" of her chronic lower back pain. Patient reports this has been ongoing for several years and is getting progressively worse. Patient reports that she has an MRI scheduled for Thursday but cannot manage pain until then. BP 148/92, 100% SpO2, and 18 RR.

## 2016-03-26 ENCOUNTER — Other Ambulatory Visit: Payer: Medicare Other

## 2016-03-28 ENCOUNTER — Other Ambulatory Visit: Payer: Medicare Other

## 2016-04-01 ENCOUNTER — Ambulatory Visit
Admission: RE | Admit: 2016-04-01 | Discharge: 2016-04-01 | Disposition: A | Discharge status: Home / Self Care | Payer: Medicare Other | Source: Ambulatory Visit | Attending: Family Medicine | Admitting: Family Medicine

## 2016-04-01 DIAGNOSIS — M48061 Spinal stenosis, lumbar region without neurogenic claudication: Secondary | ICD-10-CM | POA: Diagnosis not present

## 2016-04-01 DIAGNOSIS — M47816 Spondylosis without myelopathy or radiculopathy, lumbar region: Secondary | ICD-10-CM

## 2016-04-01 DIAGNOSIS — M5417 Radiculopathy, lumbosacral region: Secondary | ICD-10-CM

## 2016-04-02 ENCOUNTER — Telehealth: Payer: Self-pay | Admitting: Family Medicine

## 2016-04-02 NOTE — Telephone Encounter (Signed)
[phone] Jerilynn Mages) [phone] (H)  Called and patient not at home. Reported she will be back in 15 minutes. I will call back re her MRI which is below. I rec NSU eval.   New 4 mm anterolisthesis of L5 on S1, with progressive disc bulge and severe bilateral facet arthropathy. There is new moderate to severe canal stenosis at this level with progressive severe bilateral foraminal stenosis. 2. Persistent 6 mm left-sided synovial cyst at L5-S1, which could potentially affect the transiting left S1 nerve root in the lateral recess. 3. Moderate to severe facet arthropathy at L4-5 and L5-S1, with reactive marrow edema and soft tissue inflammation about the bilateral facets, worse on the right. Findings likely reflect acute exacerbation of chronic facet joint arthritis. 4. Moderate left foraminal stenosis at L4-5, similar to previous. 5. Multifactorial mild canal and foraminal stenosis at L2-3 and L3-4, in part due to epidural lipomatosis, similar to previous. 6. Degenerative disc bulge and facet disease at T11-12, with resultant moderate left foraminal stenosis and mild canal narrowing

## 2016-04-03 NOTE — Telephone Encounter (Signed)
LVM Re her results She is to call me and tell me what she wants--Nerosurgery referral? Could ocnisder ESI as well Tabitha Wilcox

## 2016-04-03 NOTE — Telephone Encounter (Signed)
Rhea I am routing to you as she will likely call Monday Dorcas Mcmurray

## 2016-04-06 ENCOUNTER — Other Ambulatory Visit: Payer: Self-pay | Admitting: *Deleted

## 2016-04-06 DIAGNOSIS — G8929 Other chronic pain: Secondary | ICD-10-CM

## 2016-04-06 DIAGNOSIS — M5441 Lumbago with sciatica, right side: Principal | ICD-10-CM

## 2016-04-06 NOTE — Telephone Encounter (Signed)
Spoke to the patient and she is ok with the referral to Neuro. Let me know who you want to send her to and I'll send our referral notes over.  Thanks!

## 2016-04-06 NOTE — Telephone Encounter (Signed)
Tabitha Wilcox I do not have anyone specific--just a neurosurgeon---they are all in same group I think. Tabitha Wilcox or Tabitha Wilcox are the only three I know. One of them or whoever has first available [facility]! Tabitha Wilcox

## 2016-04-08 ENCOUNTER — Ambulatory Visit (INDEPENDENT_AMBULATORY_CARE_PROVIDER_SITE_OTHER): Payer: Medicare Other | Admitting: *Deleted

## 2016-04-08 DIAGNOSIS — Z23 Encounter for immunization: Secondary | ICD-10-CM | POA: Diagnosis present

## 2016-04-14 NOTE — Telephone Encounter (Signed)
She has the neuro appt this Thursday with Dr. Cyndy Freeze at [facility]

## 2016-04-17 ENCOUNTER — Other Ambulatory Visit: Payer: Self-pay | Admitting: Internal Medicine

## 2016-04-17 NOTE — Telephone Encounter (Signed)
I will refill for now and expect she will follow-up next month as I do not have any openings. Please call and let her know she needs to see me for additional refills.

## 2016-04-20 ENCOUNTER — Other Ambulatory Visit: Payer: Self-pay | Admitting: *Deleted

## 2016-04-20 MED ORDER — TRAMADOL HCL 50 MG PO TABS: 50.0000 mg | ORAL_TABLET | Freq: Every day | ORAL | 0 refills | Status: DC | PRN

## 2016-04-20 NOTE — Telephone Encounter (Signed)
My apologies. I hope the new prescription clarified the instructions.

## 2016-04-20 NOTE — Telephone Encounter (Signed)
Dr. Posey Pronto, could you clarify the frequency...thanks! Tramadol 50 mg Sig: Take 1-2 tablets BY MOUTH AS NEEDED. Refill 30days following last refill

## 2016-04-20 NOTE — Addendum Note (Signed)
Addended by: Riccardo Dubin on: 04/20/2016 08:59 AM   Modules accepted: Orders

## 2016-04-20 NOTE — Telephone Encounter (Signed)
Tramadol called in to [facility] family pharmacy.

## 2016-04-23 DIAGNOSIS — M4727 Other spondylosis with radiculopathy, lumbosacral region: Secondary | ICD-10-CM | POA: Diagnosis not present

## 2016-04-23 DIAGNOSIS — M5416 Radiculopathy, lumbar region: Secondary | ICD-10-CM | POA: Diagnosis not present

## 2016-04-23 DIAGNOSIS — M4317 Spondylolisthesis, lumbosacral region: Secondary | ICD-10-CM | POA: Diagnosis not present

## 2016-05-07 ENCOUNTER — Telehealth: Payer: Self-pay | Admitting: Internal Medicine

## 2016-05-07 NOTE — Telephone Encounter (Signed)
APT. REMINDER CALL, LMTCB °

## 2016-05-08 ENCOUNTER — Encounter: Payer: Medicare Other | Admitting: Internal Medicine

## 2016-05-08 ENCOUNTER — Encounter: Payer: Self-pay | Admitting: Internal Medicine

## 2016-05-12 ENCOUNTER — Encounter: Payer: Self-pay | Admitting: Internal Medicine

## 2016-05-18 ENCOUNTER — Other Ambulatory Visit: Payer: Self-pay | Admitting: Family Medicine

## 2016-06-23 ENCOUNTER — Encounter: Payer: Self-pay | Admitting: Gastroenterology

## 2016-06-30 ENCOUNTER — Ambulatory Visit: Payer: Medicare Other

## 2016-07-02 ENCOUNTER — Ambulatory Visit: Payer: Medicare Other

## 2016-07-30 ENCOUNTER — Ambulatory Visit (INDEPENDENT_AMBULATORY_CARE_PROVIDER_SITE_OTHER): Payer: Medicare Other | Admitting: Internal Medicine

## 2016-07-30 ENCOUNTER — Encounter: Payer: Self-pay | Admitting: Internal Medicine

## 2016-07-30 VITALS — BP 120/67 | HR 101 | Temp 98.3°F | Ht 62.0 in | Wt 137.0 lb

## 2016-07-30 DIAGNOSIS — S46911A Strain of unspecified muscle, fascia and tendon at shoulder and upper arm level, right arm, initial encounter: Secondary | ICD-10-CM | POA: Diagnosis not present

## 2016-07-30 DIAGNOSIS — M47816 Spondylosis without myelopathy or radiculopathy, lumbar region: Secondary | ICD-10-CM

## 2016-07-30 DIAGNOSIS — F17210 Nicotine dependence, cigarettes, uncomplicated: Secondary | ICD-10-CM | POA: Diagnosis not present

## 2016-07-30 DIAGNOSIS — M4696 Unspecified inflammatory spondylopathy, lumbar region: Secondary | ICD-10-CM

## 2016-07-30 DIAGNOSIS — J4540 Moderate persistent asthma, uncomplicated: Secondary | ICD-10-CM

## 2016-07-30 DIAGNOSIS — Z720 Tobacco use: Secondary | ICD-10-CM

## 2016-07-30 DIAGNOSIS — Y93E1 Activity, personal bathing and showering: Secondary | ICD-10-CM

## 2016-07-30 DIAGNOSIS — J45909 Unspecified asthma, uncomplicated: Secondary | ICD-10-CM

## 2016-07-30 MED ORDER — FLUTICASONE PROPIONATE HFA 44 MCG/ACT IN AERO: 1.0000 | INHALATION_SPRAY | Freq: Two times a day (BID) | RESPIRATORY_TRACT | 2 refills | Status: DC

## 2016-07-30 MED ORDER — TRAMADOL HCL 50 MG PO TABS: 50.0000 mg | ORAL_TABLET | Freq: Every day | ORAL | 0 refills | Status: DC | PRN

## 2016-07-30 MED ORDER — DULOXETINE HCL 60 MG PO CPEP: 60.0000 mg | ORAL_CAPSULE | Freq: Every day | ORAL | 2 refills | Status: DC

## 2016-07-30 MED ORDER — DULOXETINE HCL 60 MG PO CPEP: 60.0000 mg | ORAL_CAPSULE | Freq: Every day | ORAL | 11 refills | Status: DC

## 2016-07-30 MED ORDER — CELECOXIB 100 MG PO CAPS: 200.0000 mg | ORAL_CAPSULE | Freq: Every day | ORAL | 2 refills | Status: DC

## 2016-07-30 MED ORDER — ALBUTEROL SULFATE (2.5 MG/3ML) 0.083% IN NEBU: 2.5000 mg | INHALATION_SOLUTION | Freq: Four times a day (QID) | RESPIRATORY_TRACT | 12 refills | Status: AC | PRN

## 2016-07-30 MED ORDER — CELECOXIB 100 MG PO CAPS: 200.0000 mg | ORAL_CAPSULE | Freq: Every day | ORAL | 11 refills | Status: DC

## 2016-07-30 NOTE — Assessment & Plan Note (Addendum)
Assessment She missed her last appointment with me back in 05/08/16 and would like to refill her medication today. She has been out of duloxetine and celecoxib for some months now as she was in [place] with family following the birth of her 105.   Per review of the [place] DEA database, tramadol 50 mg x 60 tablets was last refilled 04/20/16 and has not been refilled over the time interval though she acknowledges taking it from her neighbor. Though she was advised by family members to not disclose this information, she wanted to disclose it to ensure it didn't hinder her care.   She is unable to provide a UDS today as she already voided prior to her appointment. She smokes 1/3 pack/day and drinks a six-pack wine cooler/week. She acknowledges smoking marijuana but no other illicit drug use.  Review of her recent lumbar MRI shows progression of underlying disease, and she is planning to speak with a neurosurgeon.   Though she had an initially inappropriate UDS when we initiated her pain contract in February 2017, she was agreeable to substance abuse counseling, and her UDS in May 2017 reflected appropriate use of her medications. I thus authorized a one-time phone refill of this medication in February 2018 hoping she would follow-up in March 2018 to revisit her pain. Given her risk for misuse, it would be prudent to recheck UDS today and refill her other adjunctive therapies.  Plan -Refilled duloxetine 60 mg capsules to be taken daily -Refilled celecoxib 200 mg to be taken daily and advised not to take with meloxicam -Deferred refills of tramadol as she became upset when I explained to her that per [facility] policy, she needed a UDS at least once annually. I had printed this prescription off initially but withheld it from her. I sensed she felt I had not heard her when she felt needing stronger therapy given the new imaging findings despite an initial agreement to resume her normal regimen. I was going to  suggest she stay to give a urine specimen after drinking adequate fluid though she left before I could suggest it. I am leery of continuing tramadol therapy and will revisit the conditions of our pain contract at follow-up.

## 2016-07-30 NOTE — Assessment & Plan Note (Addendum)
Assessment She is interested in bupropion to help her cut back on her smoking. She denies a prior history of suicidal ideation or inpatient psychiatric admission.  Plan -Revisit bupropion at follow-up as I did not assess for prior history of seizure disorder and much of the visit today was redirected to other problems.

## 2016-07-30 NOTE — Progress Notes (Signed)
Case discussed with Dr. Posey Pronto at the time of the visit.  We reviewed the resident's history and exam and pertinent patient test results.  I agree with the assessment, diagnosis and plan of care documented in the resident's note.  Some behaviors remain concerning, specifically her unwillingness to stay to produce a urine sample in light of her previous struggles.  We will continue to hold on prescribing Tramadol or opioids and encourage non-opiate alternatives for pain management to promote her safety and health.

## 2016-07-30 NOTE — Assessment & Plan Note (Signed)
Assessment She feels more congested though without wheezing and finds herself using her albuterol inhaler 1-2 times/daily, including at night. She felt relief with her daughter's nebulizer machine and would like to have this prescribed to her. She denies any symptoms suggestive of allergic rhinitis, like congestion, sneezing, rhinorrhea, post-nasal drip.  Given her acute worsening of her asthma, I think she could benefit from an inhaled steroid as it is no longer intermittent.   Plan -Prescribed albuterol nebulizer treatment and fluticasone inhaler 44 mcg to be taken twice daily [low-dose] -Ordered nebulizer machine -Advised smoking cessation

## 2016-07-30 NOTE — Assessment & Plan Note (Signed)
Assessment Several weeks ago, she was showering when she felt her elbow pop. Since then, she noticed two lines alongside the medial surface of her right arm. She cannot identify any consistent factors that provoke pain or relieve the pain. I cannot provoke pain with any physical exam maneuver.  Plan -Planned to reassure her we could continue observing though I was unable to discuss with her as she left for the other problems

## 2016-07-30 NOTE — Progress Notes (Signed)
   CC: back pain  HPI:  [name] is a 61 y.o. who presents today for back pain. Please see assessment & plan for status of chronic medical problems.   Past Medical History:  Diagnosis Date  . Anxiety   . Arthritis   . Asthma   . Carpal tunnel syndrome on both sides   . Depression   . GERD (gastroesophageal reflux disease)   . Rotator cuff injury    Right side  . Sciatica   . Shortness of breath   . Tobacco abuse   . Ulcer    1990's    Review of Systems:  Please see each problem below for a pertinent review of systems.   Physical Exam:  Vitals:   07/30/16 1106  BP: 120/67  Pulse: (!) 101  Temp: 98.3 F (36.8 C)  TempSrc: Oral  SpO2: 98%  Weight: 137 lb (62.1 kg)  Height: 5\' 2"  (1.575 m)   Physical Exam  Constitutional: No distress.  Unable to sit but for minutes at a time  HENT:  Head: Normocephalic and atraumatic.  Eyes: Conjunctivae are normal. No scleral icterus.  Cardiovascular: Normal rate and regular rhythm.   Pulmonary/Chest: Effort normal. No respiratory distress.  Musculoskeletal:  Tendons visible in the biceps area. No pain elicited with supination or pronation against active resistance.  Skin: Skin is warm and dry. She is not diaphoretic.    Assessment & Plan:   See Encounters Tab for problem based charting.  Patient discussed with Dr. Eppie Gibson

## 2016-07-30 NOTE — Patient Instructions (Signed)
For your pain, take duloxetine and tramadol. Do not take BOTH meloxicam and celecoxib.   For your cough, we will write for albuterol nebulizer solution to use during the day. We may need to consider lung function testing and CXR down the road.

## 2016-08-10 ENCOUNTER — Telehealth: Payer: Self-pay | Admitting: *Deleted

## 2016-08-10 ENCOUNTER — Encounter: Payer: Self-pay | Admitting: *Deleted

## 2016-08-19 MED ORDER — ALBUTEROL SULFATE HFA 108 (90 BASE) MCG/ACT IN AERS: 1.0000 | INHALATION_SPRAY | Freq: Four times a day (QID) | RESPIRATORY_TRACT | 3 refills | Status: AC | PRN

## 2016-08-19 NOTE — Telephone Encounter (Signed)
Ventolin HFA is not on the Insurance formulary for the patient.  Will need to change to ProAir HFA or ProAir Respiclick which are on the formulary if warranted.  Sander Nephew, RN 08/19/2016 10:27 AM

## 2016-08-19 NOTE — Telephone Encounter (Signed)
I have refilled the new medication. Please let me know if we need anything else. Thank you.Tabitha Wilcox

## 2016-08-26 ENCOUNTER — Encounter: Payer: Medicare Other | Admitting: Internal Medicine

## 2016-08-27 ENCOUNTER — Encounter: Payer: Self-pay | Admitting: Internal Medicine

## 2016-08-27 ENCOUNTER — Encounter: Payer: Medicare Other | Admitting: Internal Medicine

## 2016-09-01 ENCOUNTER — Encounter: Payer: Self-pay | Admitting: Internal Medicine

## 2016-09-30 ENCOUNTER — Other Ambulatory Visit: Payer: Self-pay | Admitting: Neurological Surgery

## 2016-10-01 ENCOUNTER — Other Ambulatory Visit (HOSPITAL_COMMUNITY): Payer: Self-pay | Admitting: Neurological Surgery

## 2016-10-01 DIAGNOSIS — M4317 Spondylolisthesis, lumbosacral region: Secondary | ICD-10-CM

## 2016-10-06 ENCOUNTER — Ambulatory Visit (HOSPITAL_COMMUNITY)
Admission: RE | Admit: 2016-10-06 | Discharge: 2016-10-06 | Disposition: A | Discharge status: Home / Self Care | Payer: Medicare Other | Source: Ambulatory Visit | Attending: Neurological Surgery | Admitting: Neurological Surgery

## 2016-10-12 ENCOUNTER — Ambulatory Visit (HOSPITAL_COMMUNITY)
Admission: RE | Admit: 2016-10-12 | Discharge: 2016-10-12 | Disposition: A | Discharge status: Home / Self Care | Payer: Medicare Other | Source: Ambulatory Visit | Attending: Neurological Surgery | Admitting: Neurological Surgery

## 2016-10-12 ENCOUNTER — Encounter (HOSPITAL_COMMUNITY): Payer: Self-pay

## 2016-10-23 ENCOUNTER — Ambulatory Visit (HOSPITAL_COMMUNITY)
Admission: RE | Admit: 2016-10-23 | Discharge: 2016-10-23 | Disposition: A | Discharge status: Home / Self Care | Payer: Medicare Other | Source: Ambulatory Visit | Attending: Neurological Surgery | Admitting: Neurological Surgery

## 2016-10-23 DIAGNOSIS — M4807 Spinal stenosis, lumbosacral region: Secondary | ICD-10-CM | POA: Diagnosis not present

## 2016-10-23 DIAGNOSIS — M48061 Spinal stenosis, lumbar region without neurogenic claudication: Secondary | ICD-10-CM | POA: Diagnosis not present

## 2016-10-23 DIAGNOSIS — M4696 Unspecified inflammatory spondylopathy, lumbar region: Secondary | ICD-10-CM | POA: Diagnosis not present

## 2016-10-23 DIAGNOSIS — M4317 Spondylolisthesis, lumbosacral region: Secondary | ICD-10-CM

## 2016-10-23 DIAGNOSIS — Z9889 Other specified postprocedural states: Secondary | ICD-10-CM | POA: Diagnosis not present

## 2016-10-28 ENCOUNTER — Encounter (HOSPITAL_COMMUNITY): Payer: Self-pay

## 2016-10-28 ENCOUNTER — Encounter (HOSPITAL_COMMUNITY)
Admission: RE | Admit: 2016-10-28 | Discharge: 2016-10-28 | Disposition: A | Discharge status: Home / Self Care | Payer: Medicare Other | Source: Ambulatory Visit | Attending: Neurological Surgery | Admitting: Neurological Surgery

## 2016-10-28 ENCOUNTER — Other Ambulatory Visit (HOSPITAL_COMMUNITY): Payer: Self-pay | Admitting: *Deleted

## 2016-10-28 DIAGNOSIS — M4316 Spondylolisthesis, lumbar region: Secondary | ICD-10-CM | POA: Insufficient documentation

## 2016-10-28 DIAGNOSIS — Z01812 Encounter for preprocedural laboratory examination: Secondary | ICD-10-CM | POA: Insufficient documentation

## 2016-10-28 LAB — BASIC METABOLIC PANEL
Anion gap: 9 (ref 5–15)
BUN: 10 mg/dL (ref 6–20)
CALCIUM: 9.9 mg/dL (ref 8.9–10.3)
CO2: 25 mmol/L (ref 22–32)
CREATININE: 0.61 mg/dL (ref 0.44–1.00)
Chloride: 102 mmol/L (ref 101–111)
GFR calc Af Amer: >60 mL/min (ref >60)
GFR calc non Af Amer: >60 mL/min (ref >60)
GLUCOSE: 101 mg/dL — AB (ref 65–99)
Potassium: 4 mmol/L (ref 3.5–5.1)
Sodium: 136 mmol/L (ref 135–145)

## 2016-10-28 LAB — SURGICAL PCR SCREEN
MRSA, PCR: POSITIVE — AB
Staphylococcus aureus: POSITIVE — AB

## 2016-10-28 LAB — CBC
HCT: 40 % (ref 36.0–46.0)
Hemoglobin: 13.4 g/dL (ref 12.0–15.0)
MCH: 30 pg (ref 26.0–34.0)
MCHC: 33.5 g/dL (ref 30.0–36.0)
MCV: 89.5 fL (ref 78.0–100.0)
PLATELETS: 421 10*3/uL — AB (ref 150–400)
RBC: 4.47 MIL/uL (ref 3.87–5.11)
RDW: 13.9 % (ref 11.5–15.5)
WBC: 11.1 10*3/uL — ABNORMAL HIGH (ref 4.0–10.5)

## 2016-10-28 LAB — TYPE AND SCREEN
ABO/RH(D): B POS
Antibody Screen: NEGATIVE

## 2016-10-28 LAB — ABO/RH: ABO/RH(D): B POS

## 2016-10-28 NOTE — Pre-Procedure Instructions (Signed)
Tabitha Wilcox  10/28/2016      [place], [place] - [address] [place] [address] Phone: [phone] Fax: [phone]   Your procedure is scheduled on Monday, November 02, 2016 at 7:30 AM.   Report to [facility] Entrance "A" Admitting Office at 5:30 AM.   Call this number if you have problems the morning of surgery: [phone]   Questions prior to day of surgery, please call [phone] between 8 & 4 PM.   Remember:  Do not eat food or drink liquids after midnight Sunday, 11/01/16.  Take these medicines the morning of surgery with A SIP OF WATER: Celecoxib (Celebrex), Duloxetine (Cymbalta), Flovent inhaler, Albuterol nebulizer - if needed, ProAir inhaler - if needed (bring this inhaler with you day of surgery).  Stop NSAIDS (Naproxen, Aleve, Ibuprofen, etc), Multivitamins and Fish Oil as of today. Do not use Aspirin products prior to surgery.  Do NOT smoke 24 hours prior to surgery.   Do not wear jewelry, make-up or nail polish.  Do not wear lotions, powders, perfumes or deodorant.  Do not shave 48 hours prior to surgery.    Do not bring valuables to the hospital.  [facility] is not responsible for any belongings or valuables.  Contacts, dentures or bridgework may not be worn into surgery.  Leave your suitcase in the car.  After surgery it may be brought to your room.  For patients admitted to the hospital, discharge time will be determined by your treatment team.  [facility] - Preparing for Surgery  Before surgery, you can play an important role.  Because skin is not sterile, your skin needs to be as free of germs as possible.  You can reduce the number of germs on you skin by washing with CHG (chlorahexidine gluconate) soap before surgery.  CHG is an antiseptic cleaner which kills germs and bonds with the skin to continue killing germs even after washing.  Please DO NOT use if you have an  allergy to CHG or antibacterial soaps.  If your skin becomes reddened/irritated stop using the CHG and inform your nurse when you arrive at Short Stay.  Do not shave (including legs and underarms) for at least 48 hours prior to the first CHG shower.  You may shave your face.  Please follow these instructions carefully:   1.  Shower with CHG Soap the night before surgery and the                    morning of Surgery.  2.  If you choose to wash your hair, wash your hair first as usual with your       normal shampoo.  3.  After you shampoo, rinse your hair and body thoroughly to remove the shampoo.  4.  Use CHG as you would any other liquid soap.  You can apply chg directly       to the skin and wash gently with scrungie or a clean washcloth.  5.  Apply the CHG Soap to your body ONLY FROM THE NECK DOWN.        Do not use on open wounds or open sores.  Avoid contact with your eyes, ears, mouth and genitals (private parts).  Wash genitals (private parts) with your normal soap.  6.  Wash thoroughly, paying special attention to the area where your surgery        will be performed.  7.  Thoroughly rinse your body with warm water from the neck down.  8.  DO NOT shower/wash with your normal soap after using and rinsing off       the CHG Soap.  9.  Pat yourself dry with a clean towel.            10.  Wear clean pajamas.            11.  Place clean sheets on your bed the night of your first shower and do not        sleep with pets.  Day of Surgery  Do not apply any lotions/deodorants the morning of surgery.  Please wear clean clothes to the hospital.   Please read over the fact sheets that you were given.

## 2016-10-28 NOTE — Pre-Procedure Instructions (Addendum)
Tabitha Wilcox  10/28/2016      Tabitha Wilcox, [place] - [address] Tabitha Wilcox [address] Phone: [phone] Fax: [phone]   Your procedure is scheduled on Monday, August 27   Report to [facility] Entrance "A" Admitting Office at 5:30 AM.   Call this number if you have problems the morning of surgery: [phone]   Questions prior to day of surgery, please call [phone] between 8 & 4 PM.   Remember:  Do not eat food or drink liquids after midnight Sunday, 11/01/16.  Take these medicines the morning of surgery with A SIP OF WATER Duloxetine (Cymbalta), omeprazole(prilosec) Flovent inhaler, Albuterol nebulizer - if needed, ProAir inhaler - if needed (bring this inhaler with you day of surgery).  Stop NSAIDS (Naproxen, Aleve, Ibuprofen, etc),voltaren gel, All vitamins, and Fish Oil as of today. Do not use Aspirin products prior to surgery.  Do NOT smoke 24 hours prior to surgery.   Do not wear jewelry, make-up or nail polish.  Do not wear lotions, powders, perfumes or deodorant.  Do not shave 48 hours prior to surgery.    Do not bring valuables to the hospital.  [facility] is not responsible for any belongings or valuables.  Contacts, dentures or bridgework may not be worn into surgery.  Leave your suitcase in the car.  After surgery it may be brought to your room.  For patients admitted to the hospital, discharge time will be determined by your treatment team.  [facility] - Preparing for Surgery  Before surgery, you can play an important role.  Because skin is not sterile, your skin needs to be as free of germs as possible.  You can reduce the number of germs on you skin by washing with CHG (chlorahexidine gluconate) soap before surgery.  CHG is an antiseptic cleaner which kills germs and bonds with the skin to continue killing germs even after washing.  Please DO NOT use if you have an allergy to  CHG or antibacterial soaps.  If your skin becomes reddened/irritated stop using the CHG and inform your nurse when you arrive at Short Stay.  Do not shave (including legs and underarms) for at least 48 hours prior to the first CHG shower.  You may shave your face.  Please follow these instructions carefully:   1.  Shower with CHG Soap the night before surgery and the                    morning of Surgery.  2.  If you choose to wash your hair, wash your hair first as usual with your       normal shampoo.  3.  After you shampoo, rinse your hair and body thoroughly to remove the shampoo.  4.  Use CHG as you would any other liquid soap.  You can apply chg directly       to the skin and wash gently with scrungie or a clean washcloth.  5.  Apply the CHG Soap to your body ONLY FROM THE NECK DOWN.        Do not use on open wounds or open sores.  Avoid contact with your eyes, ears, mouth and genitals (private parts).  Wash genitals (private parts) with your normal soap.  6.  Wash thoroughly, paying special attention to the area where your surgery        will be performed.  7.  Thoroughly rinse your body with warm water from the neck down.  8.  DO NOT shower/wash with your normal soap after using and rinsing off       the CHG Soap.  9.  Pat yourself dry with a clean towel.            10.  Wear clean pajamas.            11.  Place clean sheets on your bed the night of your first shower and do not        sleep with pets.  Day of Surgery  Do not apply any lotions/deodorants the morning of surgery.  Please wear clean clothes to the hospital.   Please read over the fact sheets that you were given.

## 2016-11-02 ENCOUNTER — Inpatient Hospital Stay (HOSPITAL_COMMUNITY): Payer: Medicare Other

## 2016-11-02 ENCOUNTER — Encounter (HOSPITAL_COMMUNITY): Payer: Self-pay | Admitting: Surgery

## 2016-11-02 ENCOUNTER — Inpatient Hospital Stay (HOSPITAL_COMMUNITY): Payer: Medicare Other | Admitting: Certified Registered"

## 2016-11-02 ENCOUNTER — Inpatient Hospital Stay (HOSPITAL_COMMUNITY)
Admission: RE | Disposition: A | Discharge status: Home / Self Care | Payer: Self-pay | Source: Ambulatory Visit | Attending: Neurological Surgery

## 2016-11-02 ENCOUNTER — Inpatient Hospital Stay (HOSPITAL_COMMUNITY)
Admission: RE | Admit: 2016-11-02 | Discharge: 2016-11-03 | DRG: 455 | Disposition: A | Discharge status: Home / Self Care | Payer: Medicare Other | Source: Ambulatory Visit | Attending: Neurological Surgery | Admitting: Neurological Surgery

## 2016-11-02 DIAGNOSIS — M1990 Unspecified osteoarthritis, unspecified site: Secondary | ICD-10-CM | POA: Diagnosis present

## 2016-11-02 DIAGNOSIS — F17210 Nicotine dependence, cigarettes, uncomplicated: Secondary | ICD-10-CM | POA: Diagnosis present

## 2016-11-02 DIAGNOSIS — K219 Gastro-esophageal reflux disease without esophagitis: Secondary | ICD-10-CM | POA: Diagnosis present

## 2016-11-02 DIAGNOSIS — J45909 Unspecified asthma, uncomplicated: Secondary | ICD-10-CM | POA: Diagnosis present

## 2016-11-02 DIAGNOSIS — F329 Major depressive disorder, single episode, unspecified: Secondary | ICD-10-CM | POA: Diagnosis present

## 2016-11-02 DIAGNOSIS — M4317 Spondylolisthesis, lumbosacral region: Secondary | ICD-10-CM | POA: Diagnosis present

## 2016-11-02 DIAGNOSIS — M4727 Other spondylosis with radiculopathy, lumbosacral region: Secondary | ICD-10-CM | POA: Diagnosis present

## 2016-11-02 DIAGNOSIS — M5430 Sciatica, unspecified side: Secondary | ICD-10-CM | POA: Diagnosis present

## 2016-11-02 DIAGNOSIS — Z419 Encounter for procedure for purposes other than remedying health state, unspecified: Secondary | ICD-10-CM

## 2016-11-02 DIAGNOSIS — F419 Anxiety disorder, unspecified: Secondary | ICD-10-CM | POA: Diagnosis present

## 2016-11-02 DIAGNOSIS — Z7951 Long term (current) use of inhaled steroids: Secondary | ICD-10-CM

## 2016-11-02 DIAGNOSIS — M549 Dorsalgia, unspecified: Secondary | ICD-10-CM | POA: Diagnosis present

## 2016-11-02 HISTORY — PX: APPLICATION OF ROBOTIC ASSISTANCE FOR SPINAL PROCEDURE: SHX6753

## 2016-11-02 LAB — POCT I-STAT 4, (NA,K, GLUC, HGB,HCT)
GLUCOSE: 121 mg/dL — AB (ref 65–99)
HEMATOCRIT: 34 % — AB (ref 36.0–46.0)
HEMOGLOBIN: 11.6 g/dL — AB (ref 12.0–15.0)
Potassium: 3.7 mmol/L (ref 3.5–5.1)
SODIUM: 140 mmol/L (ref 135–145)

## 2016-11-02 SURGERY — POSTERIOR LUMBAR FUSION 2 LEVEL
Anesthesia: General | Site: Back

## 2016-11-02 MED ORDER — PHENOL 1.4 % MT LIQD: 1.0000 | OROMUCOSAL | Status: DC | PRN

## 2016-11-02 MED ORDER — SODIUM CHLORIDE 0.9 % IR SOLN
Status: DC | PRN
  Administered 2016-11-02: 500 mL

## 2016-11-02 MED ORDER — THROMBIN 20000 UNITS EX SOLR
CUTANEOUS | Status: DC | PRN
  Administered 2016-11-02: 20 mL via TOPICAL

## 2016-11-02 MED ORDER — ONDANSETRON HCL 4 MG/2ML IJ SOLN: 4.0000 mg | Freq: Four times a day (QID) | INTRAMUSCULAR | Status: DC | PRN

## 2016-11-02 MED ORDER — MENTHOL 3 MG MT LOZG: 1.0000 | LOZENGE | OROMUCOSAL | Status: DC | PRN

## 2016-11-02 MED ORDER — SODIUM CHLORIDE 0.9 % IJ SOLN
INTRAMUSCULAR | Status: AC
  Filled 2016-11-02: qty 10

## 2016-11-02 MED ORDER — METHOCARBAMOL 750 MG PO TABS
750.0000 mg | ORAL_TABLET | Freq: Four times a day (QID) | ORAL | Status: DC
  Administered 2016-11-02 – 2016-11-03 (×4): 750 mg via ORAL
  Filled 2016-11-02 (×4): qty 1

## 2016-11-02 MED ORDER — CEFAZOLIN SODIUM-DEXTROSE 2-4 GM/100ML-% IV SOLN
2.0000 g | INTRAVENOUS | Status: AC
  Administered 2016-11-02 (×2): 2 g via INTRAVENOUS
  Filled 2016-11-02: qty 100

## 2016-11-02 MED ORDER — BUPIVACAINE LIPOSOME 1.3 % IJ SUSP
20.0000 mL | INTRAMUSCULAR | Status: DC
  Filled 2016-11-02: qty 20

## 2016-11-02 MED ORDER — THROMBIN 20000 UNITS EX SOLR
CUTANEOUS | Status: AC
  Filled 2016-11-02: qty 20000

## 2016-11-02 MED ORDER — SUFENTANIL CITRATE 50 MCG/ML IV SOLN
INTRAVENOUS | Status: AC
  Filled 2016-11-02: qty 1

## 2016-11-02 MED ORDER — THROMBIN 5000 UNITS EX SOLR
CUTANEOUS | Status: AC
  Filled 2016-11-02: qty 5000

## 2016-11-02 MED ORDER — PROPOFOL 10 MG/ML IV BOLUS
INTRAVENOUS | Status: AC
  Filled 2016-11-02: qty 20

## 2016-11-02 MED ORDER — 0.9 % SODIUM CHLORIDE (POUR BTL) OPTIME
TOPICAL | Status: DC | PRN
  Administered 2016-11-02: 1000 mL

## 2016-11-02 MED ORDER — SENNA 8.6 MG PO TABS
1.0000 | ORAL_TABLET | Freq: Two times a day (BID) | ORAL | Status: DC
  Administered 2016-11-02 – 2016-11-03 (×3): 8.6 mg via ORAL
  Filled 2016-11-02 (×3): qty 1

## 2016-11-02 MED ORDER — BISACODYL 5 MG PO TBEC: 5.0000 mg | DELAYED_RELEASE_TABLET | Freq: Every day | ORAL | Status: DC | PRN

## 2016-11-02 MED ORDER — ADULT MULTIVITAMIN W/MINERALS CH
1.0000 | ORAL_TABLET | Freq: Every day | ORAL | Status: DC
  Administered 2016-11-02 – 2016-11-03 (×2): 1 via ORAL
  Filled 2016-11-02 (×2): qty 1

## 2016-11-02 MED ORDER — ROCURONIUM BROMIDE 10 MG/ML (PF) SYRINGE
PREFILLED_SYRINGE | INTRAVENOUS | Status: DC | PRN
  Administered 2016-11-02: 60 mg via INTRAVENOUS

## 2016-11-02 MED ORDER — DEXAMETHASONE SODIUM PHOSPHATE 10 MG/ML IJ SOLN
INTRAMUSCULAR | Status: AC
  Filled 2016-11-02: qty 1

## 2016-11-02 MED ORDER — GABAPENTIN 300 MG PO CAPS
300.0000 mg | ORAL_CAPSULE | Freq: Three times a day (TID) | ORAL | Status: DC
  Administered 2016-11-02 – 2016-11-03 (×3): 300 mg via ORAL
  Filled 2016-11-02 (×3): qty 1

## 2016-11-02 MED ORDER — CALCIUM CARBONATE-VITAMIN D 500-200 MG-UNIT PO TABS
1.0000 | ORAL_TABLET | Freq: Every day | ORAL | Status: DC
  Administered 2016-11-02 – 2016-11-03 (×2): 1 via ORAL
  Filled 2016-11-02 (×2): qty 1

## 2016-11-02 MED ORDER — HYDROMORPHONE HCL 1 MG/ML IJ SOLN
0.2500 mg | INTRAMUSCULAR | Status: DC | PRN
  Administered 2016-11-02 (×2): 0.5 mg via INTRAVENOUS

## 2016-11-02 MED ORDER — BUPIVACAINE-EPINEPHRINE (PF) 0.5% -1:200000 IJ SOLN
INTRAMUSCULAR | Status: AC
  Filled 2016-11-02: qty 30

## 2016-11-02 MED ORDER — ROCURONIUM BROMIDE 10 MG/ML (PF) SYRINGE
PREFILLED_SYRINGE | INTRAVENOUS | Status: AC
  Filled 2016-11-02: qty 5

## 2016-11-02 MED ORDER — OXYCODONE HCL 5 MG/5ML PO SOLN: 5.0000 mg | Freq: Once | ORAL | Status: AC | PRN

## 2016-11-02 MED ORDER — SODIUM CHLORIDE 0.9% FLUSH: 3.0000 mL | INTRAVENOUS | Status: DC | PRN

## 2016-11-02 MED ORDER — BUPIVACAINE HCL (PF) 0.5 % IJ SOLN
INTRAMUSCULAR | Status: AC
  Filled 2016-11-02: qty 30

## 2016-11-02 MED ORDER — CEFAZOLIN SODIUM 1 G IJ SOLR
INTRAMUSCULAR | Status: AC
  Filled 2016-11-02: qty 20

## 2016-11-02 MED ORDER — SODIUM CHLORIDE 0.9% FLUSH: 3.0000 mL | Freq: Two times a day (BID) | INTRAVENOUS | Status: DC

## 2016-11-02 MED ORDER — PHENYLEPHRINE HCL 10 MG/ML IJ SOLN
INTRAVENOUS | Status: DC | PRN
  Administered 2016-11-02: 30 ug/min via INTRAVENOUS

## 2016-11-02 MED ORDER — METHYLPREDNISOLONE ACETATE 80 MG/ML IJ SUSP
INTRAMUSCULAR | Status: AC
  Filled 2016-11-02: qty 1

## 2016-11-02 MED ORDER — LIDOCAINE-EPINEPHRINE (PF) 2 %-1:200000 IJ SOLN
INTRAMUSCULAR | Status: AC
  Filled 2016-11-02: qty 20

## 2016-11-02 MED ORDER — ALBUTEROL SULFATE (2.5 MG/3ML) 0.083% IN NEBU: 2.5000 mg | INHALATION_SOLUTION | Freq: Four times a day (QID) | RESPIRATORY_TRACT | Status: DC | PRN

## 2016-11-02 MED ORDER — BUDESONIDE 0.25 MG/2ML IN SUSP
0.2500 mg | Freq: Two times a day (BID) | RESPIRATORY_TRACT | Status: DC
  Administered 2016-11-03: 0.25 mg via RESPIRATORY_TRACT
  Filled 2016-11-02 (×3): qty 2

## 2016-11-02 MED ORDER — LABETALOL HCL 5 MG/ML IV SOLN
INTRAVENOUS | Status: AC
  Filled 2016-11-02: qty 4

## 2016-11-02 MED ORDER — CHLORHEXIDINE GLUCONATE CLOTH 2 % EX PADS
6.0000 | MEDICATED_PAD | Freq: Every day | CUTANEOUS | Status: DC
  Administered 2016-11-03: 6 via TOPICAL

## 2016-11-02 MED ORDER — OXYCODONE HCL 5 MG PO TABS
5.0000 mg | ORAL_TABLET | Freq: Once | ORAL | Status: AC | PRN
  Administered 2016-11-02: 5 mg via ORAL

## 2016-11-02 MED ORDER — PROPOFOL 10 MG/ML IV BOLUS
INTRAVENOUS | Status: DC | PRN
  Administered 2016-11-02: 20 mg via INTRAVENOUS
  Administered 2016-11-02: 100 mg via INTRAVENOUS

## 2016-11-02 MED ORDER — ONDANSETRON HCL 4 MG/2ML IJ SOLN
INTRAMUSCULAR | Status: DC | PRN
  Administered 2016-11-02: 4 mg via INTRAVENOUS

## 2016-11-02 MED ORDER — HYDROMORPHONE HCL 1 MG/ML IJ SOLN
INTRAMUSCULAR | Status: AC
  Filled 2016-11-02: qty 1

## 2016-11-02 MED ORDER — PANTOPRAZOLE SODIUM 40 MG PO TBEC
40.0000 mg | DELAYED_RELEASE_TABLET | Freq: Every day | ORAL | Status: DC
  Administered 2016-11-02: 40 mg via ORAL
  Filled 2016-11-02: qty 1

## 2016-11-02 MED ORDER — SUFENTANIL CITRATE 50 MCG/ML IV SOLN
INTRAVENOUS | Status: DC | PRN
  Administered 2016-11-02: 10 ug via INTRAVENOUS
  Administered 2016-11-02: 5 ug via INTRAVENOUS
  Administered 2016-11-02: 20 ug via INTRAVENOUS
  Administered 2016-11-02: 10 ug via INTRAVENOUS
  Administered 2016-11-02: 5 ug via INTRAVENOUS

## 2016-11-02 MED ORDER — CYANOCOBALAMIN 500 MCG PO TABS
500.0000 ug | ORAL_TABLET | Freq: Every day | ORAL | Status: DC
  Administered 2016-11-02 – 2016-11-03 (×2): 500 ug via ORAL
  Filled 2016-11-02 (×2): qty 2

## 2016-11-02 MED ORDER — CHLORHEXIDINE GLUCONATE CLOTH 2 % EX PADS: 6.0000 | MEDICATED_PAD | Freq: Once | CUTANEOUS | Status: DC

## 2016-11-02 MED ORDER — OXYCODONE HCL ER 20 MG PO T12A
20.0000 mg | EXTENDED_RELEASE_TABLET | Freq: Two times a day (BID) | ORAL | Status: DC
  Administered 2016-11-02 – 2016-11-03 (×3): 20 mg via ORAL
  Filled 2016-11-02 (×3): qty 1

## 2016-11-02 MED ORDER — MIDAZOLAM HCL 2 MG/2ML IJ SOLN
INTRAMUSCULAR | Status: AC
  Filled 2016-11-02: qty 2

## 2016-11-02 MED ORDER — MIDAZOLAM HCL 5 MG/5ML IJ SOLN
INTRAMUSCULAR | Status: DC | PRN
  Administered 2016-11-02: 2 mg via INTRAVENOUS

## 2016-11-02 MED ORDER — ZOLPIDEM TARTRATE 5 MG PO TABS: 5.0000 mg | ORAL_TABLET | Freq: Every evening | ORAL | Status: DC | PRN

## 2016-11-02 MED ORDER — DEXAMETHASONE SODIUM PHOSPHATE 10 MG/ML IJ SOLN
INTRAMUSCULAR | Status: DC | PRN
  Administered 2016-11-02: 5 mg via INTRAVENOUS

## 2016-11-02 MED ORDER — VITAMIN C 500 MG PO TABS
500.0000 mg | ORAL_TABLET | Freq: Every day | ORAL | Status: DC
  Administered 2016-11-03: 500 mg via ORAL
  Filled 2016-11-02: qty 1

## 2016-11-02 MED ORDER — CEFAZOLIN SODIUM-DEXTROSE 1-4 GM/50ML-% IV SOLN
1.0000 g | Freq: Three times a day (TID) | INTRAVENOUS | Status: AC
  Administered 2016-11-02 – 2016-11-03 (×2): 1 g via INTRAVENOUS
  Filled 2016-11-02 (×2): qty 50

## 2016-11-02 MED ORDER — LIDOCAINE 2% (20 MG/ML) 5 ML SYRINGE
INTRAMUSCULAR | Status: AC
  Filled 2016-11-02: qty 5

## 2016-11-02 MED ORDER — SODIUM CHLORIDE 0.9 % IV SOLN
INTRAVENOUS | Status: DC
  Administered 2016-11-02: 13:00:00 via INTRAVENOUS

## 2016-11-02 MED ORDER — PANTOPRAZOLE SODIUM 40 MG PO TBEC: 40.0000 mg | DELAYED_RELEASE_TABLET | Freq: Every day | ORAL | Status: DC

## 2016-11-02 MED ORDER — ALBUTEROL SULFATE HFA 108 (90 BASE) MCG/ACT IN AERS: 1.0000 | INHALATION_SPRAY | Freq: Four times a day (QID) | RESPIRATORY_TRACT | Status: DC | PRN

## 2016-11-02 MED ORDER — VANCOMYCIN HCL 1000 MG IV SOLR
INTRAVENOUS | Status: DC | PRN
  Administered 2016-11-02: 1000 mg via TOPICAL

## 2016-11-02 MED ORDER — CELECOXIB 200 MG PO CAPS
200.0000 mg | ORAL_CAPSULE | Freq: Two times a day (BID) | ORAL | Status: DC
  Administered 2016-11-02 – 2016-11-03 (×3): 200 mg via ORAL
  Filled 2016-11-02 (×3): qty 1

## 2016-11-02 MED ORDER — ONDANSETRON HCL 4 MG PO TABS: 4.0000 mg | ORAL_TABLET | Freq: Four times a day (QID) | ORAL | Status: DC | PRN

## 2016-11-02 MED ORDER — THROMBIN 5000 UNITS EX SOLR
CUTANEOUS | Status: DC | PRN
  Administered 2016-11-02 (×3): 5 mL via TOPICAL

## 2016-11-02 MED ORDER — LIDOCAINE-EPINEPHRINE 2 %-1:100000 IJ SOLN
INTRAMUSCULAR | Status: DC | PRN
  Administered 2016-11-02: 20 mL via INTRADERMAL

## 2016-11-02 MED ORDER — SODIUM CHLORIDE 0.9 % IJ SOLN
INTRAMUSCULAR | Status: AC
  Filled 2016-11-02: qty 20

## 2016-11-02 MED ORDER — DIAZEPAM 5 MG PO TABS
5.0000 mg | ORAL_TABLET | Freq: Four times a day (QID) | ORAL | Status: DC | PRN
  Administered 2016-11-02 – 2016-11-03 (×2): 5 mg via ORAL
  Filled 2016-11-02 (×2): qty 1

## 2016-11-02 MED ORDER — FLEET ENEMA 7-19 GM/118ML RE ENEM: 1.0000 | ENEMA | Freq: Once | RECTAL | Status: DC | PRN

## 2016-11-02 MED ORDER — ONDANSETRON HCL 4 MG/2ML IJ SOLN
INTRAMUSCULAR | Status: AC
  Filled 2016-11-02: qty 2

## 2016-11-02 MED ORDER — PHENYLEPHRINE 40 MCG/ML (10ML) SYRINGE FOR IV PUSH (FOR BLOOD PRESSURE SUPPORT)
PREFILLED_SYRINGE | INTRAVENOUS | Status: DC | PRN
  Administered 2016-11-02 (×4): 80 ug via INTRAVENOUS

## 2016-11-02 MED ORDER — SODIUM CHLORIDE 0.9 % IV SOLN
INTRAVENOUS | Status: DC | PRN
  Administered 2016-11-02: 20 mL

## 2016-11-02 MED ORDER — ACETAMINOPHEN 500 MG PO TABS
1000.0000 mg | ORAL_TABLET | Freq: Four times a day (QID) | ORAL | Status: DC
  Administered 2016-11-02 – 2016-11-03 (×4): 1000 mg via ORAL
  Filled 2016-11-02 (×4): qty 2

## 2016-11-02 MED ORDER — DULOXETINE HCL 60 MG PO CPEP
60.0000 mg | ORAL_CAPSULE | Freq: Every day | ORAL | Status: DC
  Administered 2016-11-02 – 2016-11-03 (×2): 60 mg via ORAL
  Filled 2016-11-02 (×2): qty 1
  Filled 2016-11-02: qty 2

## 2016-11-02 MED ORDER — OXYCODONE HCL 5 MG PO TABS
ORAL_TABLET | ORAL | Status: AC
  Filled 2016-11-02: qty 1

## 2016-11-02 MED ORDER — OXYCODONE HCL 5 MG PO TABS
5.0000 mg | ORAL_TABLET | ORAL | Status: DC | PRN
  Administered 2016-11-02 (×2): 10 mg via ORAL
  Filled 2016-11-02 (×2): qty 2

## 2016-11-02 MED ORDER — VANCOMYCIN HCL 1000 MG IV SOLR
INTRAVENOUS | Status: AC
  Filled 2016-11-02: qty 1000

## 2016-11-02 MED ORDER — DOCUSATE SODIUM 100 MG PO CAPS
100.0000 mg | ORAL_CAPSULE | Freq: Two times a day (BID) | ORAL | Status: DC
  Administered 2016-11-02 – 2016-11-03 (×3): 100 mg via ORAL
  Filled 2016-11-02 (×3): qty 1

## 2016-11-02 MED ORDER — OMEGA-3-ACID ETHYL ESTERS 1 G PO CAPS
1000.0000 mg | ORAL_CAPSULE | Freq: Every day | ORAL | Status: DC
  Administered 2016-11-03: 1000 mg via ORAL
  Filled 2016-11-02: qty 1

## 2016-11-02 MED ORDER — PANTOPRAZOLE SODIUM 40 MG IV SOLR: 40.0000 mg | Freq: Every day | INTRAVENOUS | Status: DC

## 2016-11-02 MED ORDER — LACTATED RINGERS IV SOLN
INTRAVENOUS | Status: DC | PRN
  Administered 2016-11-02 (×3): via INTRAVENOUS

## 2016-11-02 MED ORDER — SODIUM CHLORIDE 0.9 % IV SOLN: 250.0000 mL | INTRAVENOUS | Status: DC

## 2016-11-02 MED FILL — Heparin Sodium (Porcine) Inj 1000 Unit/ML: INTRAMUSCULAR | Qty: 30 | Status: AC

## 2016-11-02 MED FILL — Sodium Chloride IV Soln 0.9%: INTRAVENOUS | Qty: 1000 | Status: AC

## 2016-11-02 SURGICAL SUPPLY — 100 items
ADH SKN CLS APL DERMABOND .7 (GAUZE/BANDAGES/DRESSINGS) ×2
APL SKNCLS STERI-STRIP NONHPOA (GAUZE/BANDAGES/DRESSINGS) ×2
ATEC PORO TIPS 10D 9W 25X9X11 (Bone Implant) ×2 IMPLANT
BAG DECANTER FOR FLEXI CONT (MISCELLANEOUS) ×3 IMPLANT
BENZOIN TINCTURE PRP APPL 2/3 (GAUZE/BANDAGES/DRESSINGS) ×3 IMPLANT
BIT DRILL LONG 3.0X30 (BIT) IMPLANT
BIT DRILL LONG 3X80 (BIT) IMPLANT
BIT DRILL LONG 4X80 (BIT) IMPLANT
BIT DRILL SHORT 3.0X30 (BIT) IMPLANT
BIT DRILL SHORT 3X80 (BIT) IMPLANT
BLADE CLIPPER SURG (BLADE) IMPLANT
BLADE SURG 11 STRL SS (BLADE) ×4 IMPLANT
BUR MATCHSTICK NEURO 3.0 LAGG (BURR) ×3 IMPLANT
BUR ROUND FLUTED 5 RND (BURR) ×3 IMPLANT
CANISTER SUCT 3000ML PPV (MISCELLANEOUS) ×5 IMPLANT
CARTRIDGE OIL MAESTRO DRILL (MISCELLANEOUS) ×2 IMPLANT
CHLORAPREP W/TINT 26ML (MISCELLANEOUS) ×3 IMPLANT
CONT SPEC 4OZ CLIKSEAL STRL BL (MISCELLANEOUS) ×1 IMPLANT
CONT SPEC STER OR (MISCELLANEOUS) ×1 IMPLANT
DECANTER SPIKE VIAL GLASS SM (MISCELLANEOUS) ×3 IMPLANT
DERMABOND ADVANCED (GAUZE/BANDAGES/DRESSINGS) ×1
DERMABOND ADVANCED .7 DNX12 (GAUZE/BANDAGES/DRESSINGS) ×2 IMPLANT
DIFFUSER DRILL AIR PNEUMATIC (MISCELLANEOUS) ×3 IMPLANT
DRAPE C-ARM 42X72 X-RAY (DRAPES) ×6 IMPLANT
DRAPE MICROSCOPE LEICA (MISCELLANEOUS) IMPLANT
DRAPE POUCH INSTRU U-SHP 10X18 (DRAPES) ×3 IMPLANT
DRAPE SHEET LG 3/4 BI-LAMINATE (DRAPES) ×3 IMPLANT
DRAPE SURG 17X23 STRL (DRAPES) ×3 IMPLANT
DRSG OPSITE POSTOP 4X8 (GAUZE/BANDAGES/DRESSINGS) ×1 IMPLANT
ELECT BLADE 4.0 EZ CLEAN MEGAD (MISCELLANEOUS)
ELECT COATED BLADE 2.86 ST (ELECTRODE) ×1 IMPLANT
ELECT REM PT RETURN 9FT ADLT (ELECTROSURGICAL) ×3
ELECTRODE BLDE 4.0 EZ CLN MEGD (MISCELLANEOUS) IMPLANT
ELECTRODE REM PT RTRN 9FT ADLT (ELECTROSURGICAL) ×2 IMPLANT
GAUZE SPONGE 4X4 12PLY STRL (GAUZE/BANDAGES/DRESSINGS) IMPLANT
GAUZE SPONGE 4X4 16PLY XRAY LF (GAUZE/BANDAGES/DRESSINGS) IMPLANT
GLOVE BIO SURGEON STRL SZ7 (GLOVE) ×1 IMPLANT
GLOVE BIOGEL PI IND STRL 7.0 (GLOVE) IMPLANT
GLOVE BIOGEL PI IND STRL 7.5 (GLOVE) ×4 IMPLANT
GLOVE BIOGEL PI INDICATOR 7.0 (GLOVE) ×1
GLOVE BIOGEL PI INDICATOR 7.5 (GLOVE) ×2
GLOVE SS BIOGEL STRL SZ 7.5 (GLOVE) ×6 IMPLANT
GLOVE SUPERSENSE BIOGEL SZ 7.5 (GLOVE) ×3
GOWN STRL REUS W/ TWL LRG LVL3 (GOWN DISPOSABLE) ×4 IMPLANT
GOWN STRL REUS W/ TWL XL LVL3 (GOWN DISPOSABLE) IMPLANT
GOWN STRL REUS W/TWL LRG LVL3 (GOWN DISPOSABLE) ×6
GOWN STRL REUS W/TWL XL LVL3 (GOWN DISPOSABLE)
HANDLE T POWER 1/4 SQUARE (MISCELLANEOUS) ×1 IMPLANT
HEMOSTAT POWDER KIT SURGIFOAM (HEMOSTASIS) ×3 IMPLANT
INTERBDY W/EXPND 11X24X7-10MM (Cage) ×6 IMPLANT
INTERBODY W/EXPND 11X24X7-10MM (Cage) IMPLANT
KIT BASIN OR (CUSTOM PROCEDURE TRAY) ×3 IMPLANT
KIT INFUSE X SMALL 1.4CC (Orthopedic Implant) ×1 IMPLANT
KIT ROOM TURNOVER OR (KITS) ×3 IMPLANT
KIT SPINE MAZOR X ROBO DISP (MISCELLANEOUS) ×3 IMPLANT
MILL MEDIUM DISP (BLADE) ×1 IMPLANT
NDL HYPO 18GX1.5 BLUNT FILL (NEEDLE) ×2 IMPLANT
NDL HYPO 21X1.5 SAFETY (NEEDLE) ×4 IMPLANT
NDL HYPO 25X1 1.5 SAFETY (NEEDLE) ×2 IMPLANT
NEEDLE HYPO 18GX1.5 BLUNT FILL (NEEDLE) IMPLANT
NEEDLE HYPO 21X1.5 SAFETY (NEEDLE) ×6 IMPLANT
NEEDLE HYPO 25X1 1.5 SAFETY (NEEDLE) ×3 IMPLANT
NS IRRIG 1000ML POUR BTL (IV SOLUTION) ×3 IMPLANT
OIL CARTRIDGE MAESTRO DRILL (MISCELLANEOUS) ×3
PACK LAMINECTOMY NEURO (CUSTOM PROCEDURE TRAY) ×3 IMPLANT
PACK UNIVERSAL I (CUSTOM PROCEDURE TRAY) ×3 IMPLANT
PAD ARMBOARD 7.5X6 YLW CONV (MISCELLANEOUS) ×7 IMPLANT
PATTIES SURGICAL .5X1.5 (GAUZE/BANDAGES/DRESSINGS) ×3 IMPLANT
PIN HEAD 2.5X60MM (PIN) IMPLANT
PUTTY DBM GRAFTON 5CC (Putty) ×1 IMPLANT
ROD PC 5.5X55 TI ARSENAL (Rod) ×2 IMPLANT
RUBBERBAND STERILE (MISCELLANEOUS) IMPLANT
SCREW CBX 5.5X35MM (Screw) ×6 IMPLANT
SCREW LOCKING MOJAVE SET (Screw) ×1 IMPLANT
SCREW SCHANZ SA 4.0MM (MISCELLANEOUS) IMPLANT
SCREW SET SPINAL ARSENAL 47127 (Screw) ×6 IMPLANT
SPONGE LAP 4X18 X RAY DECT (DISPOSABLE) ×1 IMPLANT
SPONGE NEURO XRAY DETECT 1X3 (DISPOSABLE) ×3 IMPLANT
SPONGE SURGIFOAM ABS GEL 100 (HEMOSTASIS) ×3 IMPLANT
STRIP SURGICAL 1 X 6 IN (GAUZE/BANDAGES/DRESSINGS) IMPLANT
STRIP SURGICAL 1/2 X 6 IN (GAUZE/BANDAGES/DRESSINGS) IMPLANT
STRIP SURGICAL 1/4 X 6 IN (GAUZE/BANDAGES/DRESSINGS) IMPLANT
STRIP SURGICAL 3/4 X 6 IN (GAUZE/BANDAGES/DRESSINGS) IMPLANT
SUT STRATAFIX MNCRL+ 3-0 PS-2 (SUTURE) ×2
SUT STRATAFIX MONOCRYL 3-0 (SUTURE) ×1
SUT VIC AB 0 CT1 18XCR BRD8 (SUTURE) ×4 IMPLANT
SUT VIC AB 0 CT1 8-18 (SUTURE) ×6
SUT VIC AB 2-0 CT1 18 (SUTURE) ×6 IMPLANT
SUT VIC AB 3-0 SH 8-18 (SUTURE) ×4 IMPLANT
SUT VIC AB 4-0 PS2 27 (SUTURE) ×2 IMPLANT
SUTURE STRATFX MNCRL+ 3-0 PS-2 (SUTURE) IMPLANT
SYR 30ML LL (SYRINGE) ×6 IMPLANT
SYR 5ML LL (SYRINGE) ×2 IMPLANT
SYR CONTROL 10ML LL (SYRINGE) ×1 IMPLANT
SYRINGE 3CC LL L/F (MISCELLANEOUS) ×2 IMPLANT
TIP TROCAR NITINOL ILLICO 20 (INSTRUMENTS) ×6 IMPLANT
TOWEL GREEN STERILE (TOWEL DISPOSABLE) ×3 IMPLANT
TOWEL GREEN STERILE FF (TOWEL DISPOSABLE) ×3 IMPLANT
TUBE CONNECTING 12X1/4 (SUCTIONS) ×3 IMPLANT
WATER STERILE IRR 1000ML POUR (IV SOLUTION) ×3 IMPLANT

## 2016-11-02 NOTE — Evaluation (Signed)
Physical Therapy Evaluation Patient Details Name: Tabitha Wilcox MRN: [id number] DOB: 1955-10-31 Today's Date: 11/02/2016   History of Present Illness  61 y.o. female with lumbosacral spondylolisthesis and radiculopathy, now s/p PLIF.  Clinical Impression  Patient seen for mobility assessment s/p spinal surgery. Mobilizing well. Educated patient on precautions, mobility expectations, safety and car transfers. No further acute PT needs. Will sign off.     Follow Up Recommendations No PT follow up;Supervision - Intermittent    Equipment Recommendations  None recommended by PT    Recommendations for Other Services       Precautions / Restrictions Precautions Precautions: Back Precaution Booklet Issued: Yes (comment) Precaution Comments: reviewed verbally with patient Required Braces or Orthoses: Spinal Brace Spinal Brace: Lumbar corset      Mobility  Bed Mobility Overal bed mobility: Needs Assistance Bed Mobility: Rolling;Sidelying to Sit;Sit to Sidelying Rolling: Supervision Sidelying to sit: Supervision     Sit to sidelying: Supervision General bed mobility comments: Vcs for technique, good compliance, no physical assist required  Transfers Overall transfer level: Needs assistance Equipment used: None Transfers: Sit to/from Stand Sit to Stand: Supervision         General transfer comment: no physical assist, cues for safety  Ambulation/Gait Ambulation/Gait assistance: Supervision Ambulation Distance (Feet): 510 Feet Assistive device: None Gait Pattern/deviations: Step-through pattern;Narrow base of support Gait velocity: decreased Gait velocity interpretation: Below normal speed for age/gender General Gait Details: steady with ambulation, tolerated increased distance without difficulty, supervision for safety and VCs for increased cadence  Stairs Stairs: Yes Stairs assistance: Supervision Stair Management: One rail Left Number of Stairs: 4 General stair  comments: supervision for cues, no physical assist required  Wheelchair Mobility    Modified Rankin (Stroke Patients Only)       Balance Overall balance assessment: No apparent balance deficits (not formally assessed)                                           Pertinent Vitals/Pain Pain Assessment: Faces Faces Pain Scale: Hurts little more Pain Location: back Pain Descriptors / Indicators: Operative site guarding Pain Intervention(s): Monitored during session    Home Living Family/patient expects to be discharged to:: Private residence Living Arrangements: Alone Available Help at Discharge: Family Type of Home: Apartment Home Access: Level entry     Home Layout: Laundry or work area in [employer] Equipment: None      Prior Function Level of Independence: Independent               Hand Dominance   Dominant Hand: Right    Extremity/Trunk Assessment   Upper Extremity Assessment Upper Extremity Assessment: Overall WFL for tasks assessed    Lower Extremity Assessment Lower Extremity Assessment: Overall WFL for tasks assessed       Communication   Communication: No difficulties  Cognition Arousal/Alertness: Awake/alert Behavior During Therapy: WFL for tasks assessed/performed Overall Cognitive Status: Within Functional Limits for tasks assessed                                        General Comments      Exercises     Assessment/Plan    PT Assessment Patent does not need any further PT services  PT Problem List  PT Treatment Interventions      PT Goals (Current goals can be found in the Care Plan section)  Acute Rehab PT Goals PT Goal Formulation: All assessment and education complete, DC therapy    Frequency     Barriers to discharge        Co-evaluation               AM-PAC PT "6 Clicks" Daily Activity  Outcome Measure Difficulty turning over in bed (including adjusting  bedclothes, sheets and blankets)?: A Little Difficulty moving from lying on back to sitting on the side of the bed? : A Little Difficulty sitting down on and standing up from a chair with arms (e.g., wheelchair, bedside commode, etc,.)?: A Little Help needed moving to and from a bed to chair (including a wheelchair)?: A Little Help needed walking in hospital room?: A Little Help needed climbing 3-5 steps with a railing? : A Little 6 Click Score: 18    End of Session Equipment Utilized During Treatment: Back brace Activity Tolerance: Patient tolerated treatment well Patient left: in bed;with call bell/phone within reach;with family/visitor present Nurse Communication: Mobility status PT Visit Diagnosis: Difficulty in walking, not elsewhere classified (R26.2)    Time: 7591-6384 PT Time Calculation (min) (ACUTE ONLY): 19 min   Charges:   PT Evaluation $PT Eval Low Complexity: 1 Low     PT G Codes:        Alben Deeds, PT DPT  Board Certified Neurologic Specialist [place] 11/02/2016, 3:37 PM

## 2016-11-02 NOTE — H&P (Signed)
CC:  No chief complaint on file. Back and leg pain  HPI: Tabitha Wilcox is a 61 y.o. female with lumbosacral spondylolisthesis and radiculopathy.  She complains of back and leg pain.  It is unchanged since I saw her in clinic.  PMH: Past Medical History:  Diagnosis Date  . Anxiety   . Arthritis   . Asthma    and bronchitis  . Carpal tunnel syndrome on both sides   . Depression   . GERD (gastroesophageal reflux disease)   . Rotator cuff injury    Right side  . Sciatica   . Shortness of breath   . Tobacco abuse   . Ulcer    1990's    PSH: Past Surgical History:  Procedure Laterality Date  . APPENDECTOMY    . IRRIGATION AND DEBRIDEMENT ABSCESS Right 07/05/2012   Procedure: IRRIGATION AND DEBRIDEMENT RIGHT LABIAL/THIGH ABSCESS;  Surgeon: Harl Bowie, MD;  Location: [facility];  Service: General;  Laterality: Right;  . SHOULDER ARTHROSCOPY W/ ROTATOR CUFF REPAIR Right 2015  . UPPER GASTROINTESTINAL ENDOSCOPY      SH: Social History  Substance Use Topics  . Smoking status: Current Every Day Smoker    Packs/day: 0.25    Years: 25.00    Types: Cigarettes  . Smokeless tobacco: Never Used     Comment: Currently smoking 1/3-1 pack per day depending on how stressed she is.  . Alcohol use Yes     Comment: Martini sometimes.(1 a day) wine cooler    MEDS: Prior to Admission medications   Medication Sig Start Date End Date Taking? Authorizing Provider  acetaminophen (TYLENOL) 325 MG tablet Take 975 mg by mouth every 6 (six) hours as needed.   Yes [provider]  albuterol (PROAIR HFA) 108 (90 Base) MCG/ACT inhaler Inhale 1-2 puffs into the lungs every 6 (six) hours as needed for wheezing or shortness of breath. 08/19/16  Yes Riccardo Dubin, MD  albuterol (PROVENTIL) (2.5 MG/3ML) 0.083% nebulizer solution Take 3 mLs (2.5 mg total) by nebulization every 6 (six) hours as needed for wheezing or shortness of breath. 07/30/16  Yes Riccardo Dubin, MD  Calcium  Carbonate-Vitamin D (CALCIUM 600+D PO) Take 1 tablet by mouth daily.   Yes [provider]  celecoxib (CELEBREX) 100 MG capsule Take 2 capsules (200 mg total) by mouth daily. 07/30/16  Yes Riccardo Dubin, MD  DULoxetine (CYMBALTA) 60 MG capsule Take 1 capsule (60 mg total) by mouth daily. 07/30/16  Yes Riccardo Dubin, MD  fish oil-omega-3 fatty acids 1000 MG capsule Take 1,000 mg by mouth daily.    Yes [provider]  fluticasone (FLOVENT HFA) 44 MCG/ACT inhaler Inhale 1 puff into the lungs 2 (two) times daily. 07/30/16  Yes Riccardo Dubin, MD  Multiple Vitamin (MULTIVITAMIN WITH MINERALS) TABS tablet Take 1 tablet by mouth daily. Centrum   Yes [provider]  Multiple Vitamins-Minerals (HAIR/SKIN/NAILS PO) Take 1 tablet by mouth daily.   Yes [provider]  naproxen sodium (ANAPROX) 220 MG tablet Take 440 mg by mouth 3 (three) times daily as needed (for pain.).   Yes [provider]  omeprazole (PRILOSEC) 20 MG capsule Take 1 capsule (20 mg total) by mouth daily before lunch. 03/12/16  Yes Riccardo Dubin, MD  PRESCRIPTION MEDICATION Take 300 mg by mouth 2 (two) times daily as needed. For pain (patient does not have prescription---take neighbors prescribed medication) -gabapentin   Yes [provider]  vitamin B-12 (  CYANOCOBALAMIN) 500 MCG tablet Take 500 mcg by mouth daily.   Yes [provider]  vitamin C (ASCORBIC ACID) 500 MG tablet Take 500 mg by mouth daily.   Yes [provider]  VOLTAREN 1 % GEL Place 2 grams of gel onto the skin over affected area twice a day as needed and discontinue if rash Patient taking differently: Place 2 grams of gel onto the skin over affected area twice a day as needed for pain and discontinue if rash 05/19/16  Yes Dickie La, MD  traMADol (ULTRAM) 50 MG tablet Take 1-2 tablets (50-100 mg total) by mouth daily as needed. Patient not taking: Reported on 10/27/2016 07/30/16   Riccardo Dubin, MD     ALLERGY: No Known Allergies  ROS: ROS  NEUROLOGIC EXAM: Awake, alert, oriented Memory and concentration grossly intact Speech fluent, appropriate CN grossly intact Motor exam: Upper Extremities Deltoid Bicep Tricep Grip  Right 5/5 5/5 5/5 5/5  Left 5/5 5/5 5/5 5/5   Lower Extremity IP Quad PF DF EHL  Right 5/5 5/5 5/5 5/5 5/5  Left 5/5 5/5 5/5 5/5 5/5   Sensation grossly intact to LT   IMPRESSION: - 61 y.o. female with lumbosacral spondylolisthesis and radiculopathy.  She is neurologically intact.  PLAN: - L4-S1 PLIF, L4-5 laminectomy, L5-S1 Gill procedure, cortical screw fixation - We have reviewed the risks, benefits, and alternatives to surgery and she wishes to proceed.

## 2016-11-02 NOTE — Transfer of Care (Signed)
Immediate Anesthesia Transfer of Care Note  Patient: Tabitha Wilcox  Procedure(s) Performed: Procedure(s): Posterior Lumbar Four-Five/Five-Sacral One Laminectomy and Facetectomy for decompression (Lumbar Five-Sacral One Gill Procedure), Lumbar Four-Sacral One Posterior Lumbar Interbody Fusion, Lumbar Four-Sacral One Cortical Trajectory Screw Fixation (N/A) APPLICATION OF ROBOTIC ASSISTANCE FOR SPINAL PROCEDURE (N/A)  Patient Location: PACU  Anesthesia Type:General  Level of Consciousness: awake, oriented and patient cooperative  Airway & Oxygen Therapy: Patient Spontanous Breathing and Patient connected to nasal cannula oxygen  Post-op Assessment: Report given to RN, Post -op Vital signs reviewed and stable and Patient moving all extremities  Post vital signs: Reviewed and stable  Last Vitals:  Vitals:   11/02/16 0621 11/02/16 0623  BP:  119/71  Pulse:  81  Resp:  20  Temp: 36.7 C   SpO2:  100%    Last Pain:  Vitals:   11/02/16 0632  TempSrc:   PainSc: 6       Patients Stated Pain Goal: 7 (35/67/01 4103)  Complications: No apparent anesthesia complications

## 2016-11-02 NOTE — Brief Op Note (Signed)
11/02/2016  11:47 AM  PATIENT:  Tabitha Wilcox  61 y.o. female  PRE-OPERATIVE DIAGNOSIS:  Spondylolisthesis   POST-OPERATIVE DIAGNOSIS:  Spondylolisthesis   PROCEDURE:  Procedure(s): Posterior Lumbar Four-Five/Five-Sacral One Laminectomy and Facetectomy for decompression (Lumbar Five-Sacral One Gill Procedure), Lumbar Four-Sacral One Posterior Lumbar Interbody Fusion, Lumbar Four-Sacral One Cortical Trajectory Screw Fixation (N/A) APPLICATION OF ROBOTIC ASSISTANCE FOR SPINAL PROCEDURE (N/A)  SURGEON:  Surgeon(s) and Role:    * Shuna Tabor, Kevan Ny, MD - Primary    * Ashok Pall, MD - Assisting  PHYSICIAN ASSISTANT:   ASSISTANTS: Ashok Pall, MD  ANESTHESIA:   general  EBL:  Total I/O In: 2000 [I.V.:2000] Out: 825 [Urine:575; Blood:250]  BLOOD ADMINISTERED:none  DRAINS: Medium hemovac   LOCAL MEDICATIONS USED:  MARCAINE    and LIDOCAINE   SPECIMEN:  No Specimen  DISPOSITION OF SPECIMEN:  N/A  COUNTS:  YES  TOURNIQUET:  * No tourniquets in log *  DICTATION: .Dragon Dictation  PLAN OF CARE: Admit to inpatient   PATIENT DISPOSITION:  PACU - hemodynamically stable.   Delay start of Pharmacological VTE agent (>24hrs) due to surgical blood loss or risk of bleeding: yes

## 2016-11-02 NOTE — Anesthesia Procedure Notes (Signed)
Procedure Name: Intubation Date/Time: 11/02/2016 7:38 AM Performed by: Melina Copa, Xaivier Malay R Pre-anesthesia Checklist: Patient identified, Emergency Drugs available, Suction available and Patient being monitored Patient Re-evaluated:Patient Re-evaluated prior to induction Oxygen Delivery Method: Circle System Utilized Preoxygenation: Pre-oxygenation with 100% oxygen Induction Type: IV induction Ventilation: Mask ventilation without difficulty Laryngoscope Size: Mac and 3 Grade View: Grade I Tube type: Oral Tube size: 7.5 mm Number of attempts: 1 Airway Equipment and Method: Stylet Placement Confirmation: ETT inserted through vocal cords under direct vision,  positive ETCO2 and breath sounds checked- equal and bilateral Secured at: 20 cm Tube secured with: Tape Dental Injury: Teeth and Oropharynx as per pre-operative assessment

## 2016-11-02 NOTE — Anesthesia Preprocedure Evaluation (Signed)
Anesthesia Evaluation  Patient identified by MRN, date of birth, ID band Patient awake    Reviewed: Allergy & Precautions, NPO status , Patient's Chart, lab work & pertinent test results  History of Anesthesia Complications Negative for: history of anesthetic complications  Airway Mallampati: II  TM Distance: >3 FB Neck ROM: Full    Dental  (+) Upper Dentures, Partial Lower,    Pulmonary shortness of breath, asthma , neg sleep apnea, neg recent URI, Current Smoker,    breath sounds clear to auscultation       Cardiovascular negative cardio ROS   Rhythm:Regular     Neuro/Psych PSYCHIATRIC DISORDERS Anxiety Depression  Neuromuscular disease    GI/Hepatic Neg liver ROS, GERD  Medicated and Controlled,  Endo/Other    Renal/GU negative Renal ROS     Musculoskeletal  (+) Arthritis ,   Abdominal   Peds  Hematology   Anesthesia Other Findings   Reproductive/Obstetrics                             Anesthesia Physical Anesthesia Plan  ASA: II  Anesthesia Plan: General   Post-op Pain Management:    Induction: Intravenous  PONV Risk Score and Plan: 2 and Ondansetron and Dexamethasone  Airway Management Planned: Oral ETT  Additional Equipment: None  Intra-op Plan:   Post-operative Plan: Extubation in OR  Informed Consent: I have reviewed the patients History and Physical, chart, labs and discussed the procedure including the risks, benefits and alternatives for the proposed anesthesia with the patient or authorized representative who has indicated his/her understanding and acceptance.   Dental advisory given  Plan Discussed with: CRNA and Surgeon  Anesthesia Plan Comments:         Anesthesia Quick Evaluation

## 2016-11-02 NOTE — Anesthesia Postprocedure Evaluation (Signed)
Anesthesia Post Note  Patient: Tabitha Wilcox  Procedure(s) Performed: Procedure(s) (LRB): Posterior Lumbar Four-Five/Five-Sacral One Laminectomy and Facetectomy for decompression (Lumbar Five-Sacral One Gill Procedure), Lumbar Four-Sacral One Posterior Lumbar Interbody Fusion, Lumbar Four-Sacral One Cortical Trajectory Screw Fixation (N/A) APPLICATION OF ROBOTIC ASSISTANCE FOR SPINAL PROCEDURE (N/A)     Patient location during evaluation: PACU Anesthesia Type: General Level of consciousness: awake and alert Pain management: pain level controlled Vital Signs Assessment: post-procedure vital signs reviewed and stable Respiratory status: spontaneous breathing, nonlabored ventilation, respiratory function stable and patient connected to nasal cannula oxygen Cardiovascular status: blood pressure returned to baseline and stable Postop Assessment: no signs of nausea or vomiting Anesthetic complications: no    Last Vitals:  Vitals:   11/02/16 1300 11/02/16 1315  BP:  135/65  Pulse: (!) 101 90  Resp: (!) 9 14  Temp: 36.4 C 37.2 C  SpO2: 100% 98%    Last Pain:  Vitals:   11/02/16 1504  TempSrc:   PainSc: 7                  Roxana Lai

## 2016-11-03 LAB — CBC
HEMATOCRIT: 30.4 % — AB (ref 36.0–46.0)
HEMOGLOBIN: 9.9 g/dL — AB (ref 12.0–15.0)
MCH: 29.9 pg (ref 26.0–34.0)
MCHC: 32.6 g/dL (ref 30.0–36.0)
MCV: 91.8 fL (ref 78.0–100.0)
Platelets: 288 10*3/uL (ref 150–400)
RBC: 3.31 MIL/uL — ABNORMAL LOW (ref 3.87–5.11)
RDW: 14.3 % (ref 11.5–15.5)
WBC: 11.2 10*3/uL — ABNORMAL HIGH (ref 4.0–10.5)

## 2016-11-03 LAB — BASIC METABOLIC PANEL
Anion gap: 7 (ref 5–15)
BUN: 10 mg/dL (ref 6–20)
CHLORIDE: 104 mmol/L (ref 101–111)
CO2: 26 mmol/L (ref 22–32)
CREATININE: 0.74 mg/dL (ref 0.44–1.00)
Calcium: 9 mg/dL (ref 8.9–10.3)
GFR calc Af Amer: >60 mL/min (ref >60)
GFR calc non Af Amer: >60 mL/min (ref >60)
GLUCOSE: 148 mg/dL — AB (ref 65–99)
Potassium: 4.1 mmol/L (ref 3.5–5.1)
Sodium: 137 mmol/L (ref 135–145)

## 2016-11-03 MED ORDER — GABAPENTIN 300 MG PO CAPS: 300.0000 mg | ORAL_CAPSULE | Freq: Three times a day (TID) | ORAL | 2 refills | Status: DC

## 2016-11-03 MED ORDER — METHOCARBAMOL 750 MG PO TABS: 750.0000 mg | ORAL_TABLET | Freq: Four times a day (QID) | ORAL | 3 refills | Status: AC

## 2016-11-03 MED ORDER — HYDROCODONE-ACETAMINOPHEN 7.5-325 MG PO TABS: 1.0000 | ORAL_TABLET | ORAL | 0 refills | Status: DC | PRN

## 2016-11-03 MED FILL — Gelatin Absorbable MT Powder: OROMUCOSAL | Qty: 1 | Status: AC

## 2016-11-03 MED FILL — Thrombin For Soln 5000 Unit: CUTANEOUS | Qty: 5000 | Status: AC

## 2016-11-03 NOTE — Progress Notes (Signed)
Occupational Therapy Evaluation Patient Details Name: Tabitha Wilcox MRN: [id number] DOB: 07-11-1955 Today's Date: 11/03/2016    History of Present Illness 61 y.o. female with lumbosacral spondylolisthesis and radiculopathy, now s/p PLIF.   Clinical Impression   Completed all education regarding compensatory techniques for ADL, IADL tasks and functional mobility. Pt able to return demonstrate. Written information reviewed. Pt safe to DC home with intermittent S when medically stable.     Follow Up Recommendations  No OT follow up;Supervision - Intermittent    Equipment Recommendations  3 in 1 bedside commode    Recommendations for Other Services       Precautions / Restrictions Precautions Precautions: Back Precaution Booklet Issued: Yes (comment) Precaution Comments: reviewed verbally with patient Required Braces or Orthoses: Spinal Brace Spinal Brace: Lumbar corset      Mobility Bed Mobility Overal bed mobility: Modified Independent                Transfers Overall transfer level: Modified independent                    Balance Overall balance assessment: No apparent balance deficits (not formally assessed)                                         ADL either performed or assessed with clinical judgement   ADL  Overall ADL's : Needs assistance/impaired                                     Functional mobility during ADLs: Modified independent General ADL Comments: Completed  education regarding compensatory techniques for ADL and IADL tasks with available AE. REcommend pt use 3in1 for shower seat when bathing. Pt verbalzied understanding. Educated pt on reducing risk of falls and home safety.   Recommend pt purchase reacher to use for ADL/ home safety.     Vision         Perception     Praxis      Pertinent Vitals/Pain Faces Pain Scale: Hurts little more Pain Location: back Pain Descriptors / Indicators:  Operative site guarding     Hand Dominance Right   Extremity/Trunk Assessment             Communication Communication Communication: No difficulties   Cognition Arousal/Alertness: Awake/alert Behavior During Therapy: WFL for tasks assessed/performed Overall Cognitive Status: Within Functional Limits for tasks assessed                                     General Comments       Exercises     Shoulder Instructions      Home Living Family/patient expects to be discharged to:: Private residence Living Arrangements: Alone Available Help at Discharge: Family Type of Home: Apartment Home Access: Level entry     Home Layout: Laundry or work area in basement     [employer] Shower/Tub: Teacher, early years/pre: Handicapped height     Home Equipment: None          Prior Functioning/Environment Level of Independence: Independent                 OT Problem List: Decreased activity tolerance;Decreased knowledge of use of DME  or AE;Decreased knowledge of precautions;Pain      OT Treatment/Interventions:      OT Goals(Current goals can be found in the care plan section) Acute Rehab OT Goals Patient Stated Goal: to go home OT Goal Formulation: All assessment and education complete, DC therapy  OT Frequency:     Barriers to D/C:            Co-evaluation              AM-PAC PT "6 Clicks" Daily Activity     Outcome Measure Help from another person eating meals?: None Help from another person taking care of personal grooming?: None Help from another person toileting, which includes using toliet, bedpan, or urinal?: A Little Help from another person bathing (including washing, rinsing, drying)?: None Help from another person to put on and taking off regular upper body clothing?: None Help from another person to put on and taking off regular lower body clothing?: A Little 6 Click Score: 22   End of Session Equipment Utilized  During Treatment: Back brace Nurse Communication: Mobility status;Other (comment) (DME needs)  Activity Tolerance: Patient tolerated treatment well Patient left: in chair;with call bell/phone within reach  OT Visit Diagnosis: Muscle weakness (generalized) (M62.81);Pain Pain - part of body:  (back)                Time: 8916-9450 OT Time Calculation (min): 20 min Charges:  OT General Charges $OT Visit: 1 Visit OT Evaluation $OT Eval Low Complexity: 1 Low G-Codes:     Tabitha Wilcox, OT/L  [phone] 11/03/2016  Tabitha Wilcox,Tabitha Wilcox 11/03/2016, 10:06 AM

## 2016-11-03 NOTE — Progress Notes (Signed)
Patient is discharged from room 3C09 at this time. Alert and in stable condition. IV site d/c'd and instructions read to patient with understanding verbalized. Left unit via wheelchair with all belongings at side.  

## 2016-11-03 NOTE — Progress Notes (Signed)
No acute events Feels well Moving legs well D/c home

## 2016-11-03 NOTE — Discharge Summary (Signed)
Date of Admission: 11/02/2016  Date of Discharge: 11/03/16  Admission Diagnosis: L5-S1 spondylolisthesis, L4-S1 stenosis with radiculopathy  Discharge Diagnosis: Same  Procedure Performed: L4-S1 PLIF, L5-1 Gill procedure  Attending: Allyn Bertoni, Kevan Ny, MD  Hospital Course:  The patient was admitted for the above listed operation and had an uncomplicated post-operative course.  They were discharged in stable condition.  Follow up: 3 weeks  Allergies as of 11/03/2016   No Known Allergies     Medication List    STOP taking these medications   acetaminophen 325 MG tablet Commonly known as:  TYLENOL   celecoxib 100 MG capsule Commonly known as:  CELEBREX   traMADol 50 MG tablet Commonly known as:  ULTRAM     TAKE these medications   albuterol (2.5 MG/3ML) 0.083% nebulizer solution Commonly known as:  PROVENTIL Take 3 mLs (2.5 mg total) by nebulization every 6 (six) hours as needed for wheezing or shortness of breath.   albuterol 108 (90 Base) MCG/ACT inhaler Commonly known as:  PROAIR HFA Inhale 1-2 puffs into the lungs every 6 (six) hours as needed for wheezing or shortness of breath.   CALCIUM 600+D PO Take 1 tablet by mouth daily.   DULoxetine 60 MG capsule Commonly known as:  CYMBALTA Take 1 capsule (60 mg total) by mouth daily.   fish oil-omega-3 fatty acids 1000 MG capsule Take 1,000 mg by mouth daily.   fluticasone 44 MCG/ACT inhaler Commonly known as:  FLOVENT HFA Inhale 1 puff into the lungs 2 (two) times daily.   gabapentin 300 MG capsule Commonly known as:  NEURONTIN Take 1 capsule (300 mg total) by mouth 3 (three) times daily.   HAIR/SKIN/NAILS PO Take 1 tablet by mouth daily.   HYDROcodone-acetaminophen 7.5-325 MG tablet Commonly known as:  NORCO Take 1-2 tablets by mouth every 4 (four) hours as needed for moderate pain.   methocarbamol 750 MG tablet Commonly known as:  ROBAXIN Take 1 tablet (750 mg total) by mouth 4 (four) times daily.   multivitamin with minerals Tabs tablet Take 1 tablet by mouth daily. Centrum   naproxen sodium 220 MG tablet Commonly known as:  ANAPROX Take 440 mg by mouth 3 (three) times daily as needed (for pain.).   omeprazole 20 MG capsule Commonly known as:  PRILOSEC Take 1 capsule (20 mg total) by mouth daily before lunch.   PRESCRIPTION MEDICATION Take 300 mg by mouth 2 (two) times daily as needed. For pain (patient does not have prescription---take neighbors prescribed medication) -gabapentin   vitamin B-12 500 MCG tablet Commonly known as:  CYANOCOBALAMIN Take 500 mcg by mouth daily.   vitamin C 500 MG tablet Commonly known as:  ASCORBIC ACID Take 500 mg by mouth daily.   VOLTAREN 1 % Gel Generic drug:  diclofenac sodium Place 2 grams of gel onto the skin over affected area twice a day as needed and discontinue if rash What changed:  See the new instructions.            Discharge Care Instructions        Start     Ordered   11/03/16 0000  gabapentin (NEURONTIN) 300 MG capsule  3 times daily     11/03/16 0958   11/03/16 0000  methocarbamol (ROBAXIN) 750 MG tablet  4 times daily     11/03/16 0958   11/03/16 0000  Discharge instructions    Comments:  Do not lift more than 10 pounds Keep incision clean and dry   11/03/16  6283   11/03/16 0000  Diet - low sodium heart healthy     11/03/16 0958   11/03/16 0000  Increase activity slowly     11/03/16 0958   11/03/16 0000  Lifting restrictions    Comments:  Do not lift more than ten pounds   11/03/16 0958   11/03/16 0000  Call MD for:  temperature >100.4     11/03/16 0958   11/03/16 0000  Call MD for:  persistant nausea and vomiting     11/03/16 0958   11/03/16 0000  Call MD for:  severe uncontrolled pain     11/03/16 0958   11/03/16 0000  Call MD for:  redness, tenderness, or signs of infection (pain, swelling, redness, odor or green/yellow discharge around incision site)     11/03/16 0958   11/03/16 0000  Call MD for:   difficulty breathing, headache or visual disturbances     11/03/16 0958   11/03/16 0000  Call MD for:  persistant dizziness or light-headedness     11/03/16 0958   11/03/16 0000  HYDROcodone-acetaminophen (NORCO) 7.5-325 MG tablet  Every 4 hours PRN     11/03/16 1517

## 2016-11-04 ENCOUNTER — Encounter (HOSPITAL_COMMUNITY): Payer: Self-pay | Admitting: Neurological Surgery

## 2016-11-04 ENCOUNTER — Telehealth: Payer: Self-pay | Admitting: *Deleted

## 2016-11-04 NOTE — Telephone Encounter (Signed)
Prior Authorization received from [facility] for Voltaren 1% gel. PA completed online at www.covermymeds.com. PA approved via [employer] until 03/08/17. Reference number: [id number].  Derl Barrow, RN

## 2016-12-01 NOTE — Op Note (Signed)
11/02/2016  1:19 PM  PATIENT:  Tabitha Wilcox  61 y.o. female  PRE-OPERATIVE DIAGNOSIS:  Lumbosacral spondylosis with radiculopathy; lumbosacral spondylolisthesis L5-S1  POST-OPERATIVE DIAGNOSIS:  Same  PROCEDURE:  L4-5, L5-S1 laminectomy for decompression; L5-S1 Gill procedure; L4-S1 posterior lumbar interbody fusion; L4-S1 cortical screw fixation and posterolateral fusion  SURGEON:  Aldean Ast, MD  ASSISTANTS: Ashok Pall, MD  ANESTHESIA:   General  DRAINS: Medium hemovac   SPECIMEN:  None  INDICATION FOR PROCEDURE: 61 year old woman with lumbar radiculopathy and spondylolisthesis.  She did not improve with medical treatment.  I recommended the above operation. Patient understood the risks, benefits, and alternatives and potential outcomes and wished to proceed.  PROCEDURE DETAILS: After smooth induction of general endotracheal anesthesia the patient was turned prone on the [place] table. The skin of the lumbar area was clipped of hair and wiped out with alcohol. It was prepped and draped in the usual sterile fashion. The planned incision was injected with a mixture of lidocaine and Marcaine with epinephrine.  The skin was opened sharply and a subperiosteal dissection was performed to expose the lateral edges of the lamina of L4-S1.  Subperiosteal dissection was performed over the L4-5 and L5-S1 facet joints bilaterally.    The [facility] robotic system was registered to the patient using fluoroscopy.  Using the [facility] as a drill guide cortical trajectory screw tracts were then drilled at L4, L5, and S1.  K-Wires were placed down the trajectories and I then tapped over the K-wires.  K-wires were removed and the trajectories were palpated with a ball tip probe and found to be competent.  I then resected the spinous processes of L4 and L5.  I used the high speed drill to drill a trough across the lamina and pars at L4 and L5.  I then used an osteotome to fracture the bone across the  pars and separate the inferior articular process at each level.  This was separated from the underlying dura and ligament and removed from the surgical field.  The underlying ligament was elevated and resected.  Decompression was carried out to the lateral recesses with Kerrison rongeurs.  The superior edge of the exposed superior articular processes was resected to expose underlying annulus.  The thecal sac and nerve roots was separated from the posterior vertebral body wall and annulus where interbody fusion was planned.  At L4-5 an annulotomy was made. Using sequentially larger disc shavers this space was expanded. Disc material was removed with shavers, curettes, and the bone was decorticated with a rasp. The interbody space was packed with a combination of allograft and locally harvested bone and two titanium spacers were inserted into the interbody space.  This process was then repeated at the L5-S1 interspace using expandable titanium spacers instead of static cages.  Screws were then placed to the appropriate depth down the cannulated tracts.  A lordotic rod was inserted and secured in the screw caps. Final tightening with a torque wrench was performed at all levels. The remaining facet joints and transverse processes from L4 to S1 were decorticated with the high speed burr.  Fusion substrate was placed in the lateral gutters.  Meticulous hemostasis was obtained. The wound was irrigated with bacitracin saline. Exparel was injected into the paraspinous muscles.  A medium Hemovac drain was placed below the fascia. About 1 g of vancomycin powder was inserted into the wound. The wound was closed in routine anatomic layers. The skin was closed with a running subcuticular  monocryl suture and then sealed with dermabond.   The patient was then returned to the supine position on the bed.  PATIENT DISPOSITION:  PACU - hemodynamically stable.   Delay start of Pharmacological VTE agent (>24hrs) due to  surgical blood loss or risk of bleeding:  yes

## 2017-02-05 ENCOUNTER — Other Ambulatory Visit: Payer: Self-pay | Admitting: Neurological Surgery

## 2017-02-05 DIAGNOSIS — M4317 Spondylolisthesis, lumbosacral region: Secondary | ICD-10-CM

## 2017-02-10 ENCOUNTER — Ambulatory Visit
Admission: RE | Admit: 2017-02-10 | Discharge: 2017-02-10 | Disposition: A | Discharge status: Home / Self Care | Payer: Medicare Other | Source: Ambulatory Visit | Attending: Neurological Surgery | Admitting: Neurological Surgery

## 2017-02-10 DIAGNOSIS — M4317 Spondylolisthesis, lumbosacral region: Secondary | ICD-10-CM

## 2017-09-07 ENCOUNTER — Other Ambulatory Visit: Payer: Self-pay | Admitting: Internal Medicine

## 2017-09-07 DIAGNOSIS — E2839 Other primary ovarian failure: Secondary | ICD-10-CM

## 2017-09-07 DIAGNOSIS — Z1231 Encounter for screening mammogram for malignant neoplasm of breast: Secondary | ICD-10-CM

## 2017-09-08 ENCOUNTER — Encounter: Payer: Self-pay | Admitting: Gastroenterology

## 2017-09-10 ENCOUNTER — Ambulatory Visit: Payer: Medicare Other

## 2017-09-24 ENCOUNTER — Ambulatory Visit: Payer: Medicare Other | Admitting: Podiatry

## 2017-09-28 ENCOUNTER — Ambulatory Visit (INDEPENDENT_AMBULATORY_CARE_PROVIDER_SITE_OTHER): Payer: Medicare Other

## 2017-09-28 ENCOUNTER — Encounter: Payer: Self-pay | Admitting: Podiatry

## 2017-09-28 ENCOUNTER — Ambulatory Visit (INDEPENDENT_AMBULATORY_CARE_PROVIDER_SITE_OTHER): Payer: Medicare Other | Admitting: Podiatry

## 2017-09-28 DIAGNOSIS — M7730 Calcaneal spur, unspecified foot: Secondary | ICD-10-CM | POA: Diagnosis not present

## 2017-09-28 DIAGNOSIS — M792 Neuralgia and neuritis, unspecified: Secondary | ICD-10-CM | POA: Diagnosis not present

## 2017-09-28 DIAGNOSIS — M779 Enthesopathy, unspecified: Secondary | ICD-10-CM | POA: Diagnosis not present

## 2017-09-28 NOTE — Patient Instructions (Signed)

## 2017-09-28 NOTE — Progress Notes (Signed)
Subjective:    Patient ID: Tabitha Wilcox, female    DOB: [birth year], 62 y.o.   MRN: [id number]  HPI 62 year old female presents the office today for a "check up".  She says that she occasionally has some sharp pains into her right foot.  She thinks this is coming from her back as she recently had back surgery last year in August and she feels this is in her nerve is regenerating.  She states that she went for some time before having back surgery and she thinks this is a nerve issue.  She is on gabapentin twice a day 300 mg but she does think it helpful.  She denies any recent injury or trauma to her feet she denies any swelling.  She states that she has a sharp pain intermittently only happens every couple of days.  She is currently not experiencing the symptoms.  She has a history of plantar fasciitis as well but currently not experience any pain.  She has no pain to her left side.  She also states that she had a right toenail issue and she saw another doctor in the practice for this and they discussed possibly removing the toenail but is not causing any pain so she is on hold off on it.  She states that occasionally get loose and come off.  She does not want the nail taken off for have a procedure in the nail at this time.  She has no other concerns.   Review of Systems  All other systems reviewed and are negative.  Past Medical History:  Diagnosis Date  . Anxiety   . Arthritis   . Asthma    and bronchitis  . Carpal tunnel syndrome on both sides   . Depression   . GERD (gastroesophageal reflux disease)   . Rotator cuff injury    Right side  . Sciatica   . Shortness of breath   . Tobacco abuse   . Ulcer    1990's    Past Surgical History:  Procedure Laterality Date  . APPENDECTOMY    . APPLICATION OF ROBOTIC ASSISTANCE FOR SPINAL PROCEDURE N/A 11/02/2016   Procedure: APPLICATION OF ROBOTIC ASSISTANCE FOR SPINAL PROCEDURE;  Surgeon: Ditty, Kevan Ny, MD;  Location: [place];   Service: Neurosurgery;  Laterality: N/A;  . IRRIGATION AND DEBRIDEMENT ABSCESS Right 07/05/2012   Procedure: IRRIGATION AND DEBRIDEMENT RIGHT LABIAL/THIGH ABSCESS;  Surgeon: Harl Bowie, MD;  Location: [place];  Service: General;  Laterality: Right;  . SHOULDER ARTHROSCOPY W/ ROTATOR CUFF REPAIR Right 2015  . UPPER GASTROINTESTINAL ENDOSCOPY       Current Outpatient Medications:  .  albuterol (PROAIR HFA) 108 (90 Base) MCG/ACT inhaler, Inhale 1-2 puffs into the lungs every 6 (six) hours as needed for wheezing or shortness of breath. (Patient not taking: Reported on 09/28/2017), Disp: 18 g, Rfl: 3 .  albuterol (PROVENTIL) (2.5 MG/3ML) 0.083% nebulizer solution, Take 3 mLs (2.5 mg total) by nebulization every 6 (six) hours as needed for wheezing or shortness of breath., Disp: 75 mL, Rfl: 12 .  Calcium Carbonate-Vitamin D (CALCIUM 600+D PO), Take 1 tablet by mouth daily., Disp: , Rfl:  .  celecoxib (CELEBREX) 200 MG capsule, Take 200 mg by mouth 2 (two) times daily., Disp: , Rfl: 5 .  cyclobenzaprine (FLEXERIL) 10 MG tablet, Take 10 mg by mouth 3 (three) times daily., Disp: , Rfl: 10 .  DULoxetine (CYMBALTA) 60 MG capsule, Take 1 capsule (60 mg total) by mouth  daily., Disp: 30 capsule, Rfl: 2 .  fish oil-omega-3 fatty acids 1000 MG capsule, Take 1,000 mg by mouth daily. , Disp: , Rfl:  .  fluticasone (FLOVENT HFA) 44 MCG/ACT inhaler, Inhale 1 puff into the lungs 2 (two) times daily., Disp: 1 Inhaler, Rfl: 2 .  gabapentin (NEURONTIN) 300 MG capsule, Take 1 capsule (300 mg total) by mouth 3 (three) times daily., Disp: 90 capsule, Rfl: 2 .  HYDROcodone-acetaminophen (NORCO) 7.5-325 MG tablet, Take 1-2 tablets by mouth every 4 (four) hours as needed for moderate pain. (Patient not taking: Reported on 09/28/2017), Disp: 60 tablet, Rfl: 0 .  Multiple Vitamin (MULTIVITAMIN WITH MINERALS) TABS tablet, Take 1 tablet by mouth daily. Centrum, Disp: , Rfl:  .  Multiple Vitamins-Minerals (HAIR/SKIN/NAILS PO),  Take 1 tablet by mouth daily., Disp: , Rfl:  .  naproxen sodium (ANAPROX) 220 MG tablet, Take 440 mg by mouth 3 (three) times daily as needed (for pain.)., Disp: , Rfl:  .  omeprazole (PRILOSEC) 20 MG capsule, Take 1 capsule (20 mg total) by mouth daily before lunch. (Patient not taking: Reported on 09/28/2017), Disp: 90 capsule, Rfl: 0 .  PRESCRIPTION MEDICATION, Take 300 mg by mouth 2 (two) times daily as needed. For pain (patient does not have prescription---take neighbors prescribed medication) -gabapentin, Disp: , Rfl:  .  QUEtiapine (SEROQUEL) 50 MG tablet, Take 50 mg by mouth at bedtime., Disp: , Rfl: 5 .  vitamin B-12 (CYANOCOBALAMIN) 500 MCG tablet, Take 500 mcg by mouth daily., Disp: , Rfl:  .  vitamin C (ASCORBIC ACID) 500 MG tablet, Take 500 mg by mouth daily., Disp: , Rfl:  .  VOLTAREN 1 % GEL, Place 2 grams of gel onto the skin over affected area twice a day as needed and discontinue if rash (Patient taking differently: Place 2 grams of gel onto the skin over affected area twice a day as needed for pain and discontinue if rash), Disp: 100 g, Rfl: 2  No Known Allergies      Objective:   Physical Exam  General: AAO x3, NAD  Dermatological: The toenails are mildly dystrophic however the nails are from adhered to the nailbed and there is no pain in the nails there is no surrounding redness or drainage or any signs of infection present.  Vascular: Dorsalis Pedis artery and Posterior Tibial artery pedal pulses are 2/4 bilateral with immedate capillary fill time. There is no pain with calf compression, swelling, warmth, erythema.  No claudication symptoms present.  Neruologic: Grossly intact via light touch bilateral. Vibratory intact via tuning fork bilateral. Protective threshold with Semmes Wienstein monofilament intact to all pedal sites bilateral.   Musculoskeletal: At this time there is no area pinpoint bony tenderness there is no pain to vibratory sensation.  There is no edema,  erythema, increase in warmth.  Ankle, subtalar joint, midtarsal range of motion intact.  Muscular strength 5/5 in all groups tested bilateral.  Gait: Unassisted, Nonantalgic.      Assessment & Plan:  62 year old female with neuritis symptoms of right foot; right hallux onychodystrophy. -Treatment options discussed including all alternatives, risks, and complications -Etiology of symptoms were discussed -X-rays were obtained and reviewed with the patient. There is no evidence of acute fracture or stress fracture identified today.  Heel spurs are present. -She is currently gabapentin.  Discussed very much for the neuritis symptoms if it starts to become more frequent can increase the gabapentin.  I do think that her pain is more from nerve pain and  she has no other pain on exam today.  She has a history of plantar fasciitis as well although currently not expensing any pain.  Discussed stretching exercises for this.  In general we discussed an insert, arch support for her shoes.  Trula Slade DPM

## 2017-10-22 ENCOUNTER — Ambulatory Visit
Admission: RE | Admit: 2017-10-22 | Discharge: 2017-10-22 | Disposition: A | Discharge status: Home / Self Care | Payer: Medicare Other | Source: Ambulatory Visit | Attending: Internal Medicine | Admitting: Internal Medicine

## 2017-10-22 DIAGNOSIS — E2839 Other primary ovarian failure: Secondary | ICD-10-CM

## 2017-10-22 DIAGNOSIS — Z1231 Encounter for screening mammogram for malignant neoplasm of breast: Secondary | ICD-10-CM

## 2017-10-27 ENCOUNTER — Other Ambulatory Visit: Payer: Self-pay | Admitting: Neurological Surgery

## 2017-10-27 DIAGNOSIS — M4317 Spondylolisthesis, lumbosacral region: Secondary | ICD-10-CM

## 2017-11-01 ENCOUNTER — Ambulatory Visit (AMBULATORY_SURGERY_CENTER): Payer: Self-pay

## 2017-11-01 VITALS — Ht 62.0 in | Wt 183.2 lb

## 2017-11-01 DIAGNOSIS — Z86010 Personal history of colonic polyps: Secondary | ICD-10-CM

## 2017-11-01 MED ORDER — NA SULFATE-K SULFATE-MG SULF 17.5-3.13-1.6 GM/177ML PO SOLN: 1.0000 | Freq: Once | ORAL | 0 refills | Status: AC

## 2017-11-01 NOTE — Progress Notes (Signed)
Denies allergies to eggs or soy products. Denies complication of anesthesia or sedation. Denies use of weight loss medication. Denies use of O2.   Emmi instructions declined.  

## 2017-11-15 ENCOUNTER — Encounter: Payer: Self-pay | Admitting: Gastroenterology

## 2017-11-15 ENCOUNTER — Ambulatory Visit (AMBULATORY_SURGERY_CENTER): Payer: Medicare Other | Admitting: Gastroenterology

## 2017-11-15 VITALS — BP 120/69 | HR 93 | Temp 97.3°F | Resp 13 | Ht 62.0 in | Wt 183.0 lb

## 2017-11-15 DIAGNOSIS — Z86010 Personal history of colonic polyps: Secondary | ICD-10-CM | POA: Diagnosis not present

## 2017-11-15 DIAGNOSIS — D125 Benign neoplasm of sigmoid colon: Secondary | ICD-10-CM

## 2017-11-15 DIAGNOSIS — K635 Polyp of colon: Secondary | ICD-10-CM

## 2017-11-15 MED ORDER — SODIUM CHLORIDE 0.9 % IV SOLN: 500.0000 mL | Freq: Once | INTRAVENOUS | Status: DC

## 2017-11-15 NOTE — Progress Notes (Signed)
Called to room to assist during endoscopic procedure.  Patient ID and intended procedure confirmed with present staff. Received instructions for my participation in the procedure from the performing physician.  

## 2017-11-15 NOTE — Op Note (Signed)
[facility] Patient Name: Babygirl Trager Procedure Date: 11/15/2017 11:02 AM MRN: [id number] Endoscopist: Mauri Pole , MD Age: 62 Referring MD:  Date of Birth: 1956/01/06 Gender: Female Account #: [id number] Procedure:                Colonoscopy Indications:              Screening for colorectal malignant neoplasm, colon                            cancer in maternal aunt Medicines:                Monitored Anesthesia Care Procedure:                Pre-Anesthesia Assessment:                           - Prior to the procedure, a History and Physical                            was performed, and patient medications and                            allergies were reviewed. The patient's tolerance of                            previous anesthesia was also reviewed. The risks                            and benefits of the procedure and the sedation                            options and risks were discussed with the patient.                            All questions were answered, and informed consent                            was obtained. Prior Anticoagulants: The patient has                            taken no previous anticoagulant or antiplatelet                            agents. ASA Grade Assessment: II - A patient with                            mild systemic disease. After reviewing the risks                            and benefits, the patient was deemed in                            satisfactory condition to undergo the procedure.  After obtaining informed consent, the colonoscope                            was passed under direct vision. Throughout the                            procedure, the patient's blood pressure, pulse, and                            oxygen saturations were monitored continuously. The                            Colonoscope was introduced through the anus and                            advanced to the the cecum,  identified by                            appendiceal orifice and ileocecal valve. The                            colonoscopy was performed without difficulty. The                            patient tolerated the procedure well. The quality                            of the bowel preparation was excellent. The                            ileocecal valve, appendiceal orifice, and rectum                            were photographed. Scope In: 11:14:31 AM Scope Out: 11:27:30 AM Scope Withdrawal Time: 0 hours 9 minutes 15 seconds  Total Procedure Duration: 0 hours 12 minutes 59 seconds  Findings:                 The perianal and digital rectal examinations were                            normal.                           A 3 mm polyp was found in the sigmoid colon. The                            polyp was sessile. The polyp was removed with a                            cold biopsy forceps. Resection and retrieval were                            complete.  Multiple small-mouthed diverticula were found in                            the sigmoid colon and descending colon.                           Non-bleeding internal hemorrhoids were found during                            retroflexion. The hemorrhoids were small. Complications:            No immediate complications. Estimated Blood Loss:     Estimated blood loss was minimal. Impression:               - One 3 mm polyp in the sigmoid colon, removed with                            a cold biopsy forceps. Resected and retrieved.                           - Diverticulosis in the sigmoid colon and in the                            descending colon.                           - Non-bleeding internal hemorrhoids. Recommendation:           - Patient has a contact number available for                            emergencies. The signs and symptoms of potential                            delayed complications were discussed with  the                            patient. Return to normal activities tomorrow.                            Written discharge instructions were provided to the                            patient.                           - Resume previous diet.                           - Continue present medications.                           - Await pathology results.                           - Repeat colonoscopy in 5-10 years for surveillance  based on pathology results. Mauri Pole, MD 11/15/2017 11:35:13 AM This report has been signed electronically.

## 2017-11-15 NOTE — Progress Notes (Signed)
Pt's states no medical or surgical changes since previsit or office visit. 

## 2017-11-15 NOTE — Progress Notes (Signed)
Report given to PACU, vss 

## 2017-11-16 ENCOUNTER — Telehealth: Payer: Self-pay | Admitting: *Deleted

## 2017-11-16 NOTE — Telephone Encounter (Signed)
  Follow up Call-  Call back number 11/15/2017  Post procedure Call Back phone  # [phone]  Permission to leave phone message Yes  Some recent data might be hidden     Patient questions:  Do you have a fever, pain , or abdominal swelling? No. Pain Score  0 *  Have you tolerated food without any problems? Yes.    Have you been able to return to your normal activities? Yes.    Do you have any questions about your discharge instructions: Diet   No. Medications  No. Follow up visit  No.  Do you have questions or concerns about your Care? No.  Actions: * If pain score is 4 or above: No action needed, pain <4.

## 2017-11-16 NOTE — Telephone Encounter (Signed)
Left message on f/u call 

## 2017-11-17 ENCOUNTER — Encounter: Payer: Self-pay | Admitting: Gastroenterology

## 2017-11-22 ENCOUNTER — Ambulatory Visit
Admission: RE | Admit: 2017-11-22 | Discharge: 2017-11-22 | Disposition: A | Discharge status: Home / Self Care | Payer: Medicare Other | Source: Ambulatory Visit | Attending: Neurological Surgery | Admitting: Neurological Surgery

## 2017-11-22 DIAGNOSIS — M4317 Spondylolisthesis, lumbosacral region: Secondary | ICD-10-CM

## 2018-01-07 ENCOUNTER — Ambulatory Visit (INDEPENDENT_AMBULATORY_CARE_PROVIDER_SITE_OTHER): Payer: Medicare Other | Admitting: Family Medicine

## 2018-01-07 VITALS — BP 138/82 | Ht 62.0 in | Wt 180.0 lb

## 2018-01-07 DIAGNOSIS — M1712 Unilateral primary osteoarthritis, left knee: Secondary | ICD-10-CM | POA: Diagnosis not present

## 2018-01-07 MED ORDER — METHYLPREDNISOLONE ACETATE 40 MG/ML IJ SUSP
40.0000 mg | Freq: Once | INTRAMUSCULAR | Status: AC
  Administered 2018-01-07: 40 mg via INTRA_ARTICULAR

## 2018-01-07 NOTE — Assessment & Plan Note (Signed)
See procedure note below for CSI of L knee. Patient is doing well with last CSI in 2016. Since she is only requiring very infrequent injections, will hold off additional imaging at this time (last was in 2013). If she should become more symptomatic and require more injections then could consider reimaging at that time.  Continue celecoxib, advised against meloxicam. Encouraged continued exercise regimen.

## 2018-01-07 NOTE — Patient Instructions (Signed)
Joint Injection, Care After Refer to this sheet in the next few weeks. These instructions provide you with information about caring for yourself after your procedure. Your health care provider may also give you more specific instructions. Your treatment has been planned according to current medical practices, but problems sometimes occur. Call your health care provider if you have any problems or questions after your procedure. What can I expect after the procedure? After the procedure, it is common to have:  Soreness.  Warmth.  Swelling. You may have more pain, swelling, and warmth than you did before the injection. This reaction may last for about one day. Follow these instructions at home: Bathing  If you were given a bandage (dressing), keep it dry until your health care provider says it can be removed. Ask your health care provider when you can start showering or taking a bath. Managing pain, stiffness, and swelling  If directed, apply ice to the injection area:  Put ice in a plastic bag.  Place a towel between your skin and the bag.  Leave the ice on for 20 minutes, 2-3 times per day.  Do not apply heat to the area.  Raise the injection area above the level of your heart while you are sitting or lying down. Activity  Avoid strenuous activities for as long as directed by your health care provider. Ask your health care provider when you can return to your normal activities. General instructions  Take medicines only as directed by your health care provider.  Do not take aspirin or other over-the-counter medicines unless your health care provider says you can.  Check your injection site every day for signs of infection. Watch for:  Redness, swelling, or pain.  Fluid, blood, or pus.  Follow your health care provider's instructions about dressing changes and removal. Contact a health care provider if:  You have symptoms at your injection site that last longer than two  days after your procedure.  You have redness, swelling, or pain in your injection area.  You have fluid, blood, or pus coming from your injection site.  You have warmth in your injection area.  You have a fever.  Your pain is not controlled with medicine. Get help right away if:  Your joint turns very red.  Your joint becomes very swollen.  Your joint pain is severe. This information is not intended to replace advice given to you by your health care provider. Make sure you discuss any questions you have with your health care provider. Document Released: 03/16/2014 Document Revised: 10/30/2015 Document Reviewed: 01/03/2014 Elsevier Interactive Patient Education  2018 [employer]vier Inc.   

## 2018-01-07 NOTE — Progress Notes (Signed)
   HPI  CC: L knee pain  Has had chronic long standing bilateral knee pain for which she has gotten steroid injections, states that the last one was several years ago.  Today she is here because her L knee has started to become more painful especially with the change in weather. She feels medial sided sharp pains with some crackling that she thinks is "bone on bone" rubbing.  No locking or weakness. No falls. Has continued to walk every morning, up to 2 miles now.   Also has chronic ankle pain that she states is rather mild. She used to take meloxicam for this which helped. She has been on celecoxib on a daily basis every since her back surgery and this seems to bring some benefit as well. She has been able to function well despite her ankle pain.   See HPI and/or previous note for associated ROS.  Objective: BP 138/82   Ht 5\' 2"  (1.575 m)   Wt 180 lb (81.6 kg)   BMI 32.92 kg/m  Gen: NAD, well groomed, a/o x3, normal affect.  CV: Well-perfused. Warm.  Resp: Non-labored.  Neuro: Sensation intact throughout. No gross coordination deficits.  Knees: bilateral knees with full active ROM in flexion and extension. No crepitus. TTP over L medial joint line. No erythema or warmth.  Assessment and plan:  DJD (degenerative joint disease) of knee See procedure note below for CSI of L knee. Patient is doing well with last CSI in 2016. Since she is only requiring very infrequent injections, will hold off additional imaging at this time (last was in 2013). If she should become more symptomatic and require more injections then could consider reimaging at that time.  Continue celecoxib, advised against meloxicam. Encouraged continued exercise regimen.   INJECTION: Patient was given informed consent, signed copy in the chart. Appropriate time out was taken. Area prepped in usual fashion with overlying skin cleaned using isopropyl alcohol. Local anesthesia achieved using Ethyl Chloride spray (Cooling  spray). 1 cc of methylprednisolone 40 mg/ml plus  4 cc of 1% lidocaine without epinephrine was injected into the L knee joint using a medial approach. The patient tolerated the procedure well. There were no complications. Post procedure instructions were given.  Bufford Lope, DO PGY-3, [facility] 01/07/2018 11:52 AM

## 2018-01-08 ENCOUNTER — Encounter: Payer: Self-pay | Admitting: Family Medicine

## 2018-01-08 NOTE — Progress Notes (Signed)
[place] Attending Note: I have seen and examined this patient. I have discussed this patient with the resident and reviewed the assessment and plan as documented above. I agree with the resident's findings and plan. I was present for and superviesd the injection of her knee.

## 2018-02-08 ENCOUNTER — Telehealth: Payer: Self-pay

## 2018-02-08 DIAGNOSIS — M1712 Unilateral primary osteoarthritis, left knee: Secondary | ICD-10-CM

## 2018-02-08 DIAGNOSIS — M25562 Pain in left knee: Secondary | ICD-10-CM

## 2018-02-08 DIAGNOSIS — M25561 Pain in right knee: Secondary | ICD-10-CM

## 2018-02-08 NOTE — Telephone Encounter (Signed)
Let her know I ordered X rays Not sure what else we can do but have her come back and see me next available appt after she gets x rays Tabitha Wilcox [facility]!

## 2018-02-08 NOTE — Telephone Encounter (Signed)
Please advise 

## 2018-02-09 ENCOUNTER — Ambulatory Visit
Admission: RE | Admit: 2018-02-09 | Discharge: 2018-02-09 | Disposition: A | Discharge status: Home / Self Care | Payer: Medicare Other | Source: Ambulatory Visit | Attending: Family Medicine | Admitting: Family Medicine

## 2018-02-09 DIAGNOSIS — M25561 Pain in right knee: Secondary | ICD-10-CM

## 2018-02-09 DIAGNOSIS — M25562 Pain in left knee: Secondary | ICD-10-CM

## 2018-02-09 DIAGNOSIS — M1712 Unilateral primary osteoarthritis, left knee: Secondary | ICD-10-CM

## 2018-02-11 ENCOUNTER — Encounter: Payer: Self-pay | Admitting: Family Medicine

## 2018-02-11 ENCOUNTER — Ambulatory Visit (INDEPENDENT_AMBULATORY_CARE_PROVIDER_SITE_OTHER): Payer: Medicare Other | Admitting: Family Medicine

## 2018-02-11 VITALS — BP 132/72 | Ht 62.0 in | Wt 180.0 lb

## 2018-02-11 DIAGNOSIS — M1712 Unilateral primary osteoarthritis, left knee: Secondary | ICD-10-CM | POA: Diagnosis not present

## 2018-02-11 MED ORDER — TRAMADOL HCL 50 MG PO TABS: ORAL_TABLET | ORAL | 2 refills | Status: DC

## 2018-02-11 NOTE — Patient Instructions (Addendum)
We have referred you to Dr. Berenice Primas at [facility]  [phone]  Appt: Tuesday 02/15/18 @ 2:45 pm, arrive at 2:15 pmGEN: WD WN NAD

## 2018-02-13 NOTE — Progress Notes (Signed)
    CHIEF COMPLAINT / HPI:  Left knee pain No better after CSI at last office visit. Pain is constant, 8/10. Using cane to assist walking. Awakens her at night. Aching in nature. Worse with activity. Has had an episode of apparent locking of knee on at least ne occasion.  REVIEW OF SYSTEMS: Pertinent review of systems: negative for fever or unusual weight change.See HPI for additional ROS. No unusual other arthralgias or myalgias.  PERTINENT  PMH / PSH: I have reviewed the patient's medications, allergies, past medical and surgical history, smoking status and updated in the EMR as appropriate. Current smoker  OBJECTIVE:  GEN: WD WN NAD Left knee lacks 5-10 degrees of total extension. Medial joint line tenderness Xray: bilateral standing: left knee medial joint space obliteration.  ASSESSMENT / PLAN:  DJD (degenerative joint disease) of knee Hx of probable medial meniscus tear in 2013, now with bone on bone arthritis on the LEFT. We had tried corticosteroid injection( which had previously been helpful) at last office visit with little to no improvement. Xray reviewed today. I think she should see orthopedics for evaluation [possible TKR. We have not tried viscosupplementation and they may wish to try that first.   We discussed. Greater than 50% of our 25 minute office visit was spent in counseling and education regarding these issues.  Will do short term tramadol for pain.

## 2018-02-13 NOTE — Assessment & Plan Note (Signed)
Hx of probable medial meniscus tear in 2013, now with bone on bone arthritis on the LEFT. We had tried corticosteroid injection( which had previously been helpful) at last office visit with little to no improvement. Xray reviewed today. I think she should see orthopedics for evaluation [possible TKR. We have not tried viscosupplementation and they may wish to try that first.   We discussed. Greater than 50% of our 25 minute office visit was spent in counseling and education regarding these issues.  Will do short term tramadol for pain.

## 2018-05-04 ENCOUNTER — Telehealth: Payer: Self-pay | Admitting: Internal Medicine

## 2018-05-04 MED ORDER — TRAMADOL HCL 50 MG PO TABS: ORAL_TABLET | ORAL | 3 refills | Status: DC

## 2018-05-04 NOTE — Telephone Encounter (Signed)
Patient is requesting a refill for her Tramadol for her knee pain.  Pharmacy is [facility] on [address].  Best number to contact if questions is [phone].

## 2018-05-18 ENCOUNTER — Other Ambulatory Visit: Payer: Self-pay | Admitting: Neurological Surgery

## 2018-05-18 DIAGNOSIS — S32009K Unspecified fracture of unspecified lumbar vertebra, subsequent encounter for fracture with nonunion: Secondary | ICD-10-CM

## 2018-06-04 ENCOUNTER — Other Ambulatory Visit: Payer: Self-pay

## 2018-06-04 ENCOUNTER — Encounter: Payer: Self-pay | Admitting: Physician Assistant

## 2018-06-04 ENCOUNTER — Ambulatory Visit
Admission: EM | Admit: 2018-06-04 | Discharge: 2018-06-04 | Disposition: A | Discharge status: Home / Self Care | Payer: Medicare Other | Attending: Physician Assistant | Admitting: Physician Assistant

## 2018-06-04 DIAGNOSIS — J22 Unspecified acute lower respiratory infection: Secondary | ICD-10-CM

## 2018-06-04 MED ORDER — AMOXICILLIN-POT CLAVULANATE ER 1000-62.5 MG PO TB12: 2.0000 | ORAL_TABLET | Freq: Two times a day (BID) | ORAL | 0 refills | Status: AC

## 2018-06-04 MED ORDER — AZITHROMYCIN 250 MG PO TABS: 250.0000 mg | ORAL_TABLET | Freq: Every day | ORAL | 0 refills | Status: DC

## 2018-06-04 NOTE — ED Triage Notes (Addendum)
Per pt she has been having a dry to wet cough for about 3 weeks with some fevers off and on. Pt  Does get chills when fever goes up and down. SOB more three weeks ago but now not as bad. Some mucus is coming up with colors this week and not before.

## 2018-06-04 NOTE — Discharge Instructions (Signed)
I am treating you for pneumonia given history and exam. Start augmentin and azithromycin as directed. Albuterol as needed. As discussed, your heart rate was fast during this visit. Please hydrate as much as you can. If you develop worsening symptoms, chest pain, shortness of breath/trouble breathing, palpitations, weakness, dizziness, passing out, go to the emergency department for further evaluation needed.

## 2018-06-04 NOTE — ED Provider Notes (Signed)
[facility]    CSN: [id number] Arrival date & time: 06/04/18  1244     History   Chief Complaint Chief Complaint  Patient presents with  . Cough  . Nasal Congestion    HPI Tabitha Wilcox is a 63 y.o. female.   63 year old female with history of asthma, bronchitis, sciatica, comes in for 3-week history of cough.  Patient states started with 2-week history of dry cough, without rhinorrhea, nasal congestion, sore throat.  She states would wake up feeling like "my airway is dry down to the lungs".  She had intermittent shortness of breath that was relieved with albuterol.  Starting a week ago, had fever with chills, T-max 102, responding with Tylenol/Motrin.  States sometimes will feel postnasal drip.  States has not had any shortness of breath for at least 1 week.  Denies chest pain, palpitation, weakness, dizziness, syncope.  She does feel more fatigued, and therefore came in for evaluation.  She has been taking OTC cold medication with some relief.  She is a current everyday smoker, though has not smoked in the past 3 weeks.  She denies any sick contacts.  No travels.     Past Medical History:  Diagnosis Date  . Allergy   . Anxiety   . Arthritis   . Asthma    and bronchitis  . Carpal tunnel syndrome on both sides   . Depression   . GERD (gastroesophageal reflux disease)   . Rotator cuff injury    Right side  . Sciatica   . Shortness of breath   . Tobacco abuse   . Ulcer    1990's    Patient Active Problem List   Diagnosis Date Noted  . Spondylolisthesis of lumbosacral region 11/02/2016  . Strain of right upper arm 07/30/2016  . Hepatitis C antibody test positive 02/08/2015  . Thoracic arthritis 12/21/2014  . DJD (degenerative joint disease) of knee 10/20/2014  . Obesity 07/20/2014  . Pre-diabetes 07/20/2014  . Insomnia 07/20/2014  . Cervical spine arthritis ([place]) 03/16/2014  . Family history of glaucoma 10/13/2013  . Toenail deformity 06/14/2013  .  Osteopenia 05/08/2013  . Healthcare maintenance 12/19/2012  . Alcohol dependence in remission ([place]) 08/15/2012  . Special screening for malignant neoplasms, colon 08/19/2011  . Medial meniscus tear 07/14/2011  . Tobacco abuse 04/13/2011  . Knee pain, bilateral 03/31/2011  . Ankle pain 03/12/2011  . Esophageal stricture 03/12/2011  . Asthma, chronic 12/26/2010  . GERD (gastroesophageal reflux disease) 12/26/2010  . Carpal tunnel syndrome on both sides   . Rotator cuff injury   .  lumar surgery  PLIF L5-S1 02/08/2009  . DEPRESSION 12/27/2008    Past Surgical History:  Procedure Laterality Date  . APPENDECTOMY    . APPLICATION OF ROBOTIC ASSISTANCE FOR SPINAL PROCEDURE N/A 11/02/2016   Procedure: APPLICATION OF ROBOTIC ASSISTANCE FOR SPINAL PROCEDURE;  Surgeon: Ditty, Kevan Ny, MD;  Location: [facility];  Service: Neurosurgery;  Laterality: N/A;  . IRRIGATION AND DEBRIDEMENT ABSCESS Right 07/05/2012   Procedure: IRRIGATION AND DEBRIDEMENT RIGHT LABIAL/THIGH ABSCESS;  Surgeon: Harl Bowie, MD;  Location: [facility];  Service: General;  Laterality: Right;  . SHOULDER ARTHROSCOPY W/ ROTATOR CUFF REPAIR Right 2015  . UPPER GASTROINTESTINAL ENDOSCOPY      OB History   No obstetric history on file.      Home Medications    Prior to Admission medications   Medication Sig Start Date End Date Taking? Authorizing Provider  albuterol [facility] HFA)  108 (90 Base) MCG/ACT inhaler Inhale 1-2 puffs into the lungs every 6 (six) hours as needed for wheezing or shortness of breath. 08/19/16   Riccardo Dubin, MD  albuterol (PROVENTIL) (2.5 MG/3ML) 0.083% nebulizer solution Take 3 mLs (2.5 mg total) by nebulization every 6 (six) hours as needed for wheezing or shortness of breath. 07/30/16   Riccardo Dubin, MD  amoxicillin-clavulanate (AUGMENTIN XR) 1000-62.5 MG 12 hr tablet Take 2 tablets by mouth 2 (two) times daily for 7 days. 06/04/18 06/11/18  Tasia Catchings, Ethelbert Thain V, PA-C  azithromycin (ZITHROMAX) 250 MG  tablet Take 1 tablet (250 mg total) by mouth daily. Take first 2 tablets together, then 1 every day until finished. 06/04/18   Tasia Catchings, Faatimah Spielberg V, PA-C  Calcium Carbonate-Vitamin D (CALCIUM 600+D PO) Take 1 tablet by mouth daily.    [provider]  celecoxib (CELEBREX) 200 MG capsule Take 200 mg by mouth 2 (two) times daily. 09/07/17   [provider]  cyclobenzaprine (FLEXERIL) 10 MG tablet Take 10 mg by mouth 3 (three) times daily. 09/03/17   [provider]  fluticasone (FLOVENT HFA) 44 MCG/ACT inhaler Inhale 1 puff into the lungs 2 (two) times daily. 07/30/16   Riccardo Dubin, MD  gabapentin (NEURONTIN) 300 MG capsule Take 1 capsule (300 mg total) by mouth 3 (three) times daily. 11/03/16   Ditty, Kevan Ny, MD  KRILL OIL PO Take by mouth.    [provider]  Multiple Vitamin (MULTIVITAMIN WITH MINERALS) TABS tablet Take 1 tablet by mouth daily. Centrum    [provider]  omeprazole (PRILOSEC) 20 MG capsule Take 1 capsule (20 mg total) by mouth daily before lunch. 03/12/16   Riccardo Dubin, MD  QUEtiapine (SEROQUEL) 50 MG tablet Take 50 mg by mouth at bedtime. 09/07/17   [provider]  traMADol (ULTRAM) 50 MG tablet Take 1 or 2 tabs every 12 hours as needed for chronic knee pain 05/04/18   Dickie La, MD  varenicline (CHANTIX) 1 MG tablet Take 1 mg by mouth 2 (two) times daily.    [provider]  vitamin B-12 (CYANOCOBALAMIN) 500 MCG tablet Take 500 mcg by mouth daily.    [provider]  vitamin C (ASCORBIC ACID) 500 MG tablet Take 500 mg by mouth daily.    [provider]  VOLTAREN 1 % GEL Place 2 grams of gel onto the skin over affected area twice a day as needed and discontinue if rash 05/19/16   Dickie La, MD    Family History Family History  Problem Relation Age of Onset  . Colon cancer Maternal Aunt   . Colon cancer Paternal Uncle   . Esophageal cancer Neg Hx   . Rectal cancer Neg Hx   . Stomach cancer Neg  Hx     Social History Social History   Tobacco Use  . Smoking status: Current Every Day Smoker    Packs/day: 0.25    Years: 25.00    Pack years: 6.25    Types: Cigarettes  . Smokeless tobacco: Never Used  . Tobacco comment: Currently smoking less than 1/4  pack per day depending on how stressed she is.  Substance Use Topics  . Alcohol use: Yes    Comment: Martini sometimes.(1 a day) wine cooler  . Drug use: No    Comment: stopped since 2 1/2 years. Before using Marijuana.      Allergies   Patient has no known allergies.   Review of  Systems Review of Systems  Reason unable to perform ROS: See HPI as above.     Physical Exam Triage Vital Signs ED Triage Vitals [06/04/18 1247]  Enc Vitals Group     BP 119/79     Pulse Rate (!) 135     Resp 16     Temp 99.5 F (37.5 C)     Temp Source Oral     SpO2 95 %     Weight      Height      Head Circumference      Peak Flow      Pain Score 4     Pain Loc      Pain Edu?      Excl. in [place]?    No data found.  Updated Vital Signs BP 119/79 (BP Location: Right Arm)   Pulse (!) 135   Temp 99.5 F (37.5 C) (Oral)   Resp 16   SpO2 95%   Physical Exam Constitutional:      General: She is not in acute distress.    Appearance: She is well-developed. She is not ill-appearing, toxic-appearing or diaphoretic.  HENT:     Head: Normocephalic and atraumatic.     Right Ear: Tympanic membrane, ear canal and external ear normal. Tympanic membrane is not erythematous or bulging.     Left Ear: Tympanic membrane, ear canal and external ear normal. Tympanic membrane is not erythematous or bulging.     Nose: Nose normal. No congestion or rhinorrhea.     Right Sinus: No maxillary sinus tenderness or frontal sinus tenderness.     Left Sinus: No maxillary sinus tenderness or frontal sinus tenderness.     Mouth/Throat:     Mouth: Mucous membranes are moist.     Pharynx: Oropharynx is clear. Uvula midline.  Eyes:      Conjunctiva/sclera: Conjunctivae normal.     Pupils: Pupils are equal, round, and reactive to light.  Neck:     Musculoskeletal: Normal range of motion and neck supple.  Cardiovascular:     Rate and Rhythm: Regular rhythm. Tachycardia present.     Heart sounds: Normal heart sounds. No murmur. No friction rub. No gallop.   Pulmonary:     Effort: Pulmonary effort is normal. No accessory muscle usage, prolonged expiration, respiratory distress or retractions.     Comments: Patient speaking in full sentences without difficulty. Left lower field crackles, not completely resolved with cough.  Skin:    General: Skin is warm and dry.  Neurological:     Mental Status: She is alert and oriented to person, place, and time.     Comments: Able to ambulate on own without difficulty.       UC Treatments / Results  Labs (all labs ordered are listed, but only abnormal results are displayed) Labs Reviewed - No data to display  EKG None  Radiology No results found.  Procedures Procedures (including critical care time)  Medications Ordered in UC Medications - No data to display  Initial Impression / Assessment and Plan / UC Course  I have reviewed the triage vital signs and the nursing notes.  Pertinent labs & imaging results that were available during my care of the patient were reviewed by me and considered in my medical decision making (see chart for details).    Given history and exam, will treat for pneumonia with augmentin and azithromycin. Patient tachycardic on exam, denies chest pain, shortness of breath, palpitations, weakness, dizziness, lightheadedness. States  has not been eating or drinking much the past week. She is afebrile without antipyretics for >8hours. Other symptomatic treatment discussed. Patient to follow up here or with PCP in 1 week if symptoms not improving. Return precautions given. Patient expresses understanding and agrees to plan.  Final Clinical Impressions(s) /  UC Diagnoses   Final diagnoses:  Lower respiratory infection    ED Prescriptions    Medication Sig Dispense Auth. Provider   amoxicillin-clavulanate (AUGMENTIN XR) 1000-62.5 MG 12 hr tablet Take 2 tablets by mouth 2 (two) times daily for 7 days. 28 tablet Estera Ozier V, PA-C   azithromycin (ZITHROMAX) 250 MG tablet Take 1 tablet (250 mg total) by mouth daily. Take first 2 tablets together, then 1 every day until finished. 6 tablet Tobin Chad, PA-C 06/04/18 1340

## 2018-06-24 ENCOUNTER — Other Ambulatory Visit: Payer: Self-pay

## 2018-06-24 ENCOUNTER — Ambulatory Visit
Admission: RE | Admit: 2018-06-24 | Discharge: 2018-06-24 | Disposition: A | Discharge status: Home / Self Care | Payer: Medicare Other | Source: Ambulatory Visit | Attending: Neurological Surgery | Admitting: Neurological Surgery

## 2018-06-24 DIAGNOSIS — S32009K Unspecified fracture of unspecified lumbar vertebra, subsequent encounter for fracture with nonunion: Secondary | ICD-10-CM

## 2018-09-01 ENCOUNTER — Other Ambulatory Visit: Payer: Self-pay | Admitting: Family Medicine

## 2018-10-17 IMAGING — CT CT L SPINE W/O CM
3 series · 12 of 33 positions shown, 14 images · non-contrast
Comparison: Lumbar spine MRI 04/01/2016

CLINICAL DATA: Lumbosacral spondylolisthesis. Preoperative mapping.

EXAM:
CT LUMBAR SPINE WITHOUT CONTRAST
TECHNIQUE: Multidetector CT imaging of the lumbar spine was performed without
intravenous contrast administration. Multiplanar CT image
reconstructions were also generated.

[Series 4: l-spine adult 2.0 · axial · 0.29mm/px · z∈[+760,+948]mm · 4 of 136 slices shown, 5 images]
[im 21/136  soft-tissue]
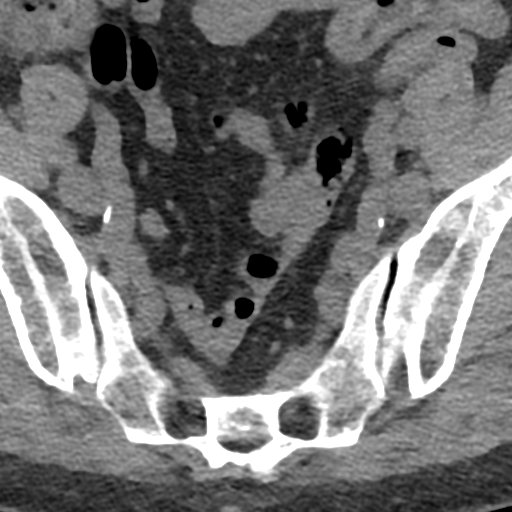
[im 21/136  bone]
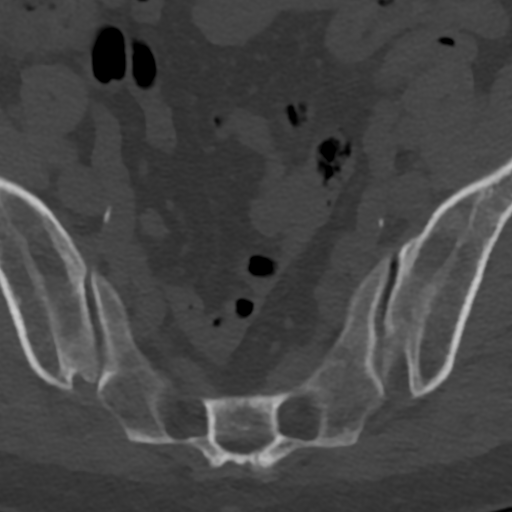
[im 52/136  bone]
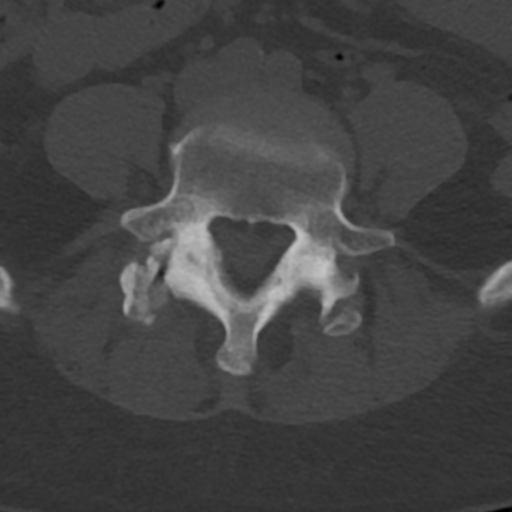
[im 84/136  bone]
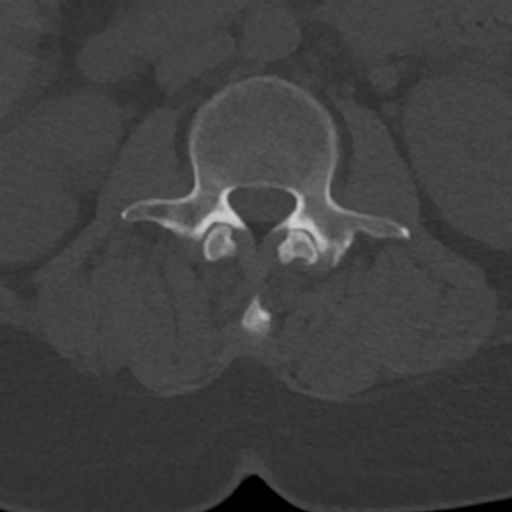
[im 115/136  bone]
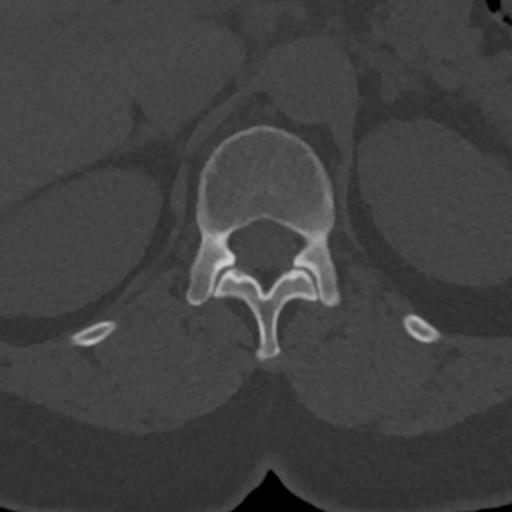

[Series 8: cor st · coronal · 0.35mm/px · 3 of 70 slices shown]
[im 14/70  bone]
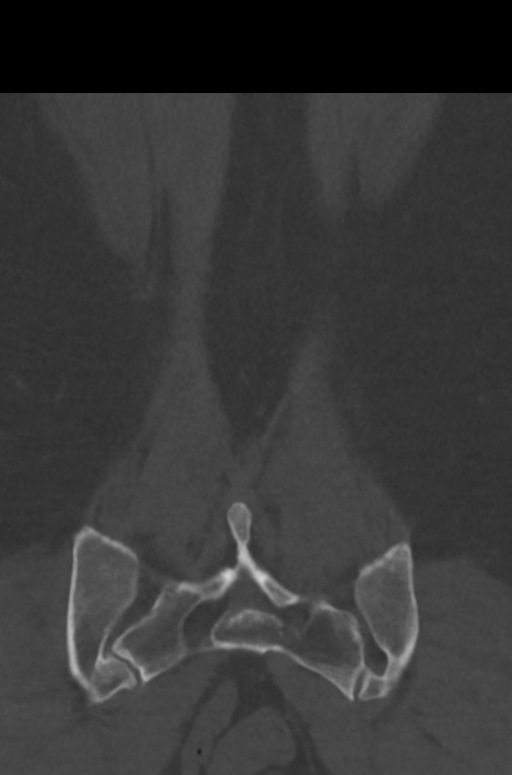
[im 28/70  bone]
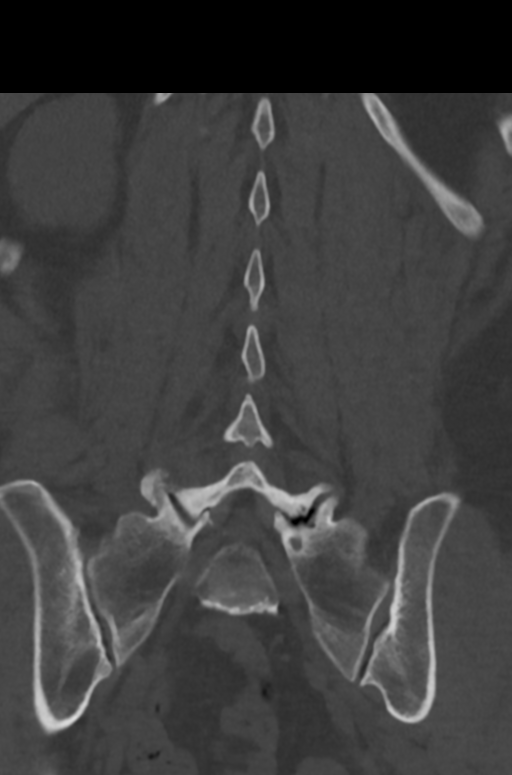
[im 42/70  bone]
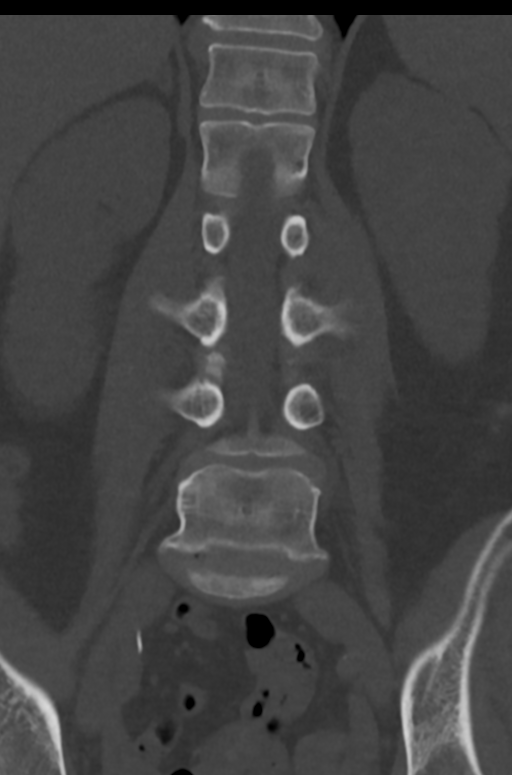

[Series 9: sagittal st · sagittal · 0.32mm/px · 5 of 73 slices shown, 6 images]
[im 25/73  bone]
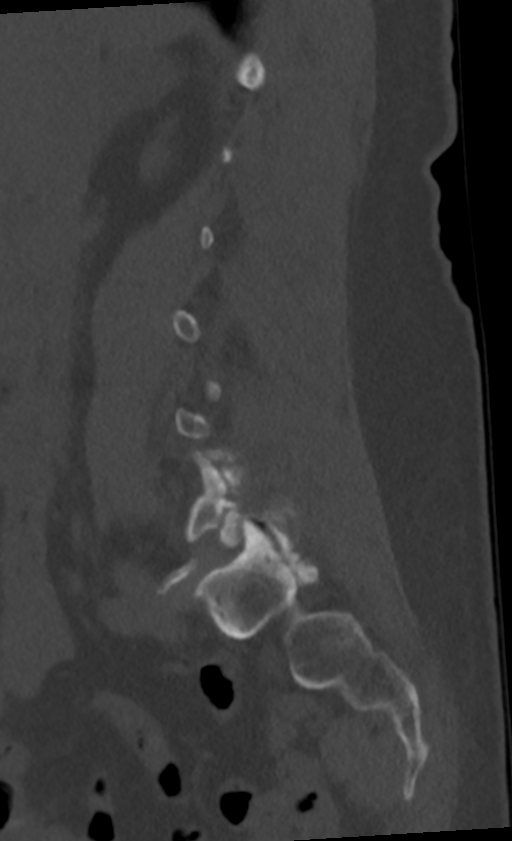
[im 31/73  bone]
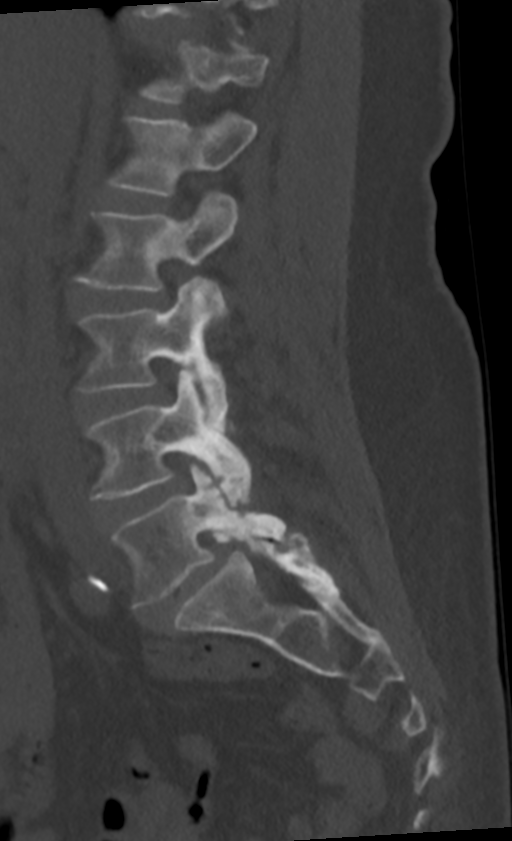
[im 37/73  soft-tissue]
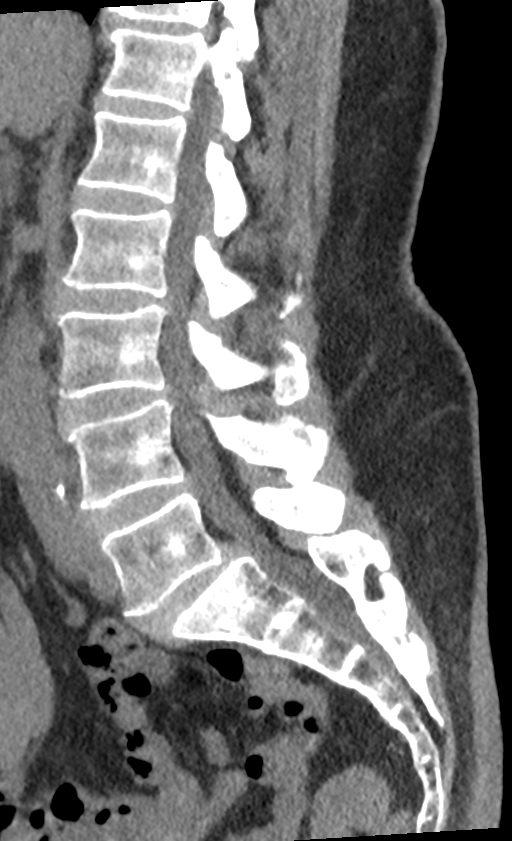
[im 37/73  bone]
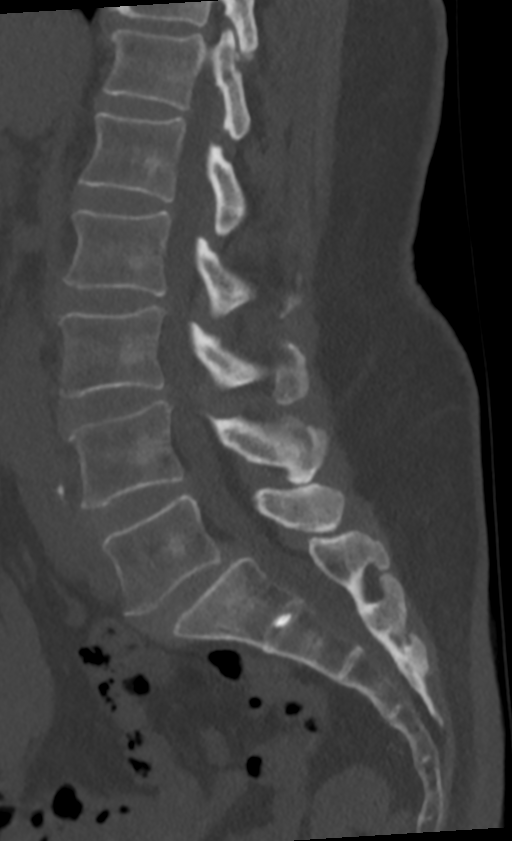
[im 43/73  bone]
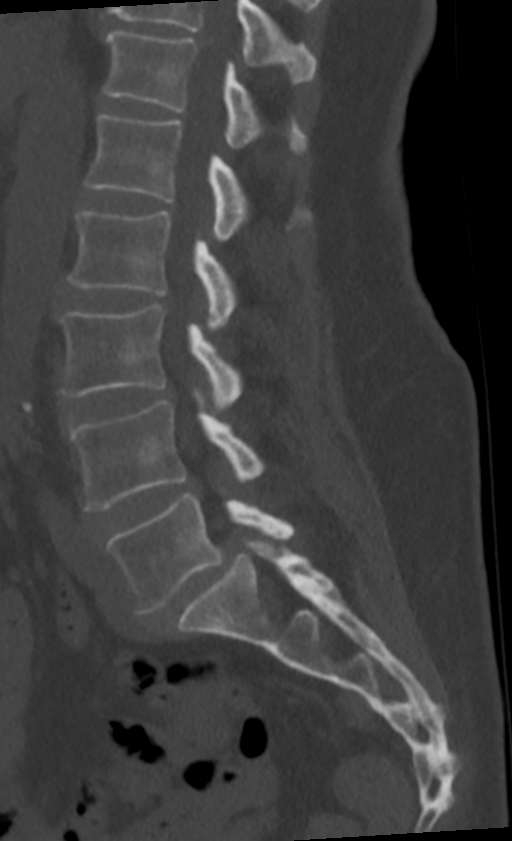
[im 49/73  bone]
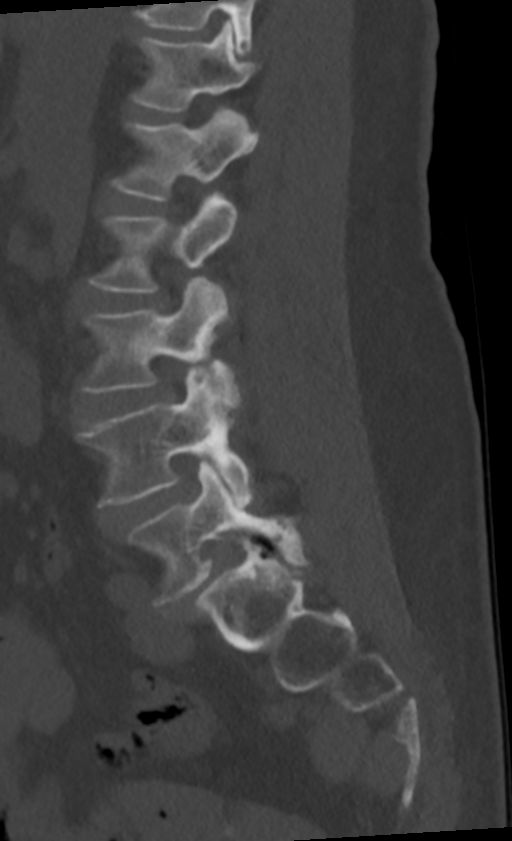

[12 of 33 positions shown; findings below may reference images not displayed]

FINDINGS: Segmentation: 5 lumbar type vertebrae.

Alignment: Grade 1 anterolisthesis at L5-S1 secondary to facet
hypertrophy.

Vertebrae: No acute fracture or focal pathologic process.

Paraspinal and other soft tissues: Mild aortic atherosclerosis.

Disc levels:

T12-L1: Normal disc space and facets. No spinal canal or
neuroforaminal stenosis.

L1-L2: Normal disc space and facets. No spinal canal or
neuroforaminal stenosis.

L2-L3: Small disc bulge.  No stenosis.  Normal facets.

L3-L4: Mild facet hypertrophy. Small disc bulge. Mild left foraminal
stenosis.

L4-L5: Diffuse disc bulge and moderate facet hypertrophy. No spinal
canal stenosis. Mild-to-moderate bilateral neural foraminal
stenosis, unchanged.

L5-S1: Severe bilateral facet hypertrophy with associated grade 1
anterolisthesis. Severe bilateral neural foraminal stenosis.
Moderate spinal canal stenosis.

Visualized sacrum: Normal.
IMPRESSION: 1. Successful preoperative mapping of the lumbar spine using the
BAWA protocol.
2. Grade 1 L5-S1 anterolisthesis secondary to severe facet arthrosis
with associated severe bilateral neural foraminal stenosis and
moderate central spinal canal stenosis.
3. Unchanged mild-to-moderate bilateral L4-L5 neural foraminal
stenosis, predominantly due to facet arthrosis.

## 2018-11-08 ENCOUNTER — Other Ambulatory Visit: Payer: Self-pay | Admitting: Internal Medicine

## 2018-11-08 DIAGNOSIS — Z1231 Encounter for screening mammogram for malignant neoplasm of breast: Secondary | ICD-10-CM

## 2018-11-22 ENCOUNTER — Other Ambulatory Visit: Payer: Self-pay

## 2018-11-22 ENCOUNTER — Ambulatory Visit
Admission: RE | Admit: 2018-11-22 | Discharge: 2018-11-22 | Disposition: A | Discharge status: Home / Self Care | Payer: Medicare Other | Source: Ambulatory Visit | Attending: Internal Medicine | Admitting: Internal Medicine

## 2018-11-22 DIAGNOSIS — Z1231 Encounter for screening mammogram for malignant neoplasm of breast: Secondary | ICD-10-CM

## 2018-11-30 ENCOUNTER — Other Ambulatory Visit: Payer: Self-pay | Admitting: Orthopedic Surgery

## 2018-12-21 ENCOUNTER — Other Ambulatory Visit: Payer: Self-pay | Admitting: Orthopedic Surgery

## 2018-12-22 NOTE — Patient Instructions (Addendum)
DUE TO COVID-19 ONLY ONE VISITOR IS ALLOWED TO COME WITH YOU AND STAY IN THE WAITING ROOM ONLY DURING PRE OP AND PROCEDURE DAY OF SURGERY. THE 1 VISITOR MAY VISIT WITH YOU AFTER SURGERY IN YOUR PRIVATE ROOM DURING VISITING HOURS ONLY!  YOU NEED TO HAVE A COVID 19 TEST ON___Tuesday 10-20____ @__10 :00AM_____, THIS TEST MUST BE DONE BEFORE SURGERY, COME  [address], [place] , [address].  ([place]) ONCE YOUR COVID TEST IS COMPLETED, PLEASE BEGIN THE QUARANTINE INSTRUCTIONS AS OUTLINED IN YOUR HANDOUT.                Tabitha Wilcox    Your procedure is scheduled on: 10-23   Report to [facility] Main  Entrance   Report to admitting at 9:45AM     Call this number if you have problems the morning of surgery [phone]    Do not eat food After Midnight. YOU MAY HAVE CLEAR LIQUIDS FROM MIDNIGHT UNTIL 9:15AM. At 9:15AM Please finish the prescribed Pre-Surgery ENSURE drink. Nothing by mouth after you finish the ENSURE drink !   CLEAR LIQUID DIET   Foods Allowed                                                                     Foods Excluded  Coffee and tea, regular and decaf                             liquids that you cannot  Plain Jell-O any favor except red or purple                                           see through such as: Fruit ices (not with fruit pulp)                                     milk, soups, orange juice  Iced Popsicles                                    All solid food Carbonated beverages, regular and diet                                    Cranberry, grape and apple juices Sports drinks like Gatorade Lightly seasoned clear broth or consume(fat free) Sugar, honey syrup  Sample Menu Breakfast                                Lunch                                     Supper Cranberry juice  Beef broth                            Chicken broth Jell-O                                     Grape juice                            Apple juice Coffee or tea                        Jell-O                                      Popsicle                                                Coffee or tea                        Coffee or tea  _____________________________________________________________________  BRUSH YOUR TEETH MORNING OF SURGERY AND RINSE YOUR MOUTH OUT, NO CHEWING GUM CANDY OR MINTS.     Take these medicines the morning of surgery with A SIP OF WATER: GABAPENTIN , PROVENTIL INHALER, FLOVENT INHALER, ALBUTEROL INHALER IF NEEDED                                 You may not have any metal on your body including hair pins and              piercings  Do not wear jewelry, make-up, lotions, powders or perfumes, deodorant             Do not wear nail polish on your fingernails.  Do not shave  48 hours prior to surgery.         Do not bring valuables to the hospital. [place].  Contacts, dentures or bridgework may not be worn into surgery.  YOU MAY BRING A SMALL OVERNIGHT BAG              Please read over the following fact sheets you were given: _____________________________________________________________________             [facility] - Preparing for Surgery Before surgery, you can play an important role.  Because skin is not sterile, your skin needs to be as free of germs as possible.  You can reduce the number of germs on your skin by washing with CHG (chlorahexidine gluconate) soap before surgery.  CHG is an antiseptic cleaner which kills germs and bonds with the skin to continue killing germs even after washing. Please DO NOT use if you have an allergy to CHG or antibacterial soaps.  If your skin becomes reddened/irritated stop using the CHG and inform your nurse when you arrive at Short Stay. Do not shave (including legs and underarms) for at least 48 hours prior to the first CHG  shower.  You may shave your face/neck. Please follow these instructions  carefully:  1.  Shower with CHG Soap the night before surgery and the  morning of Surgery.  2.  If you choose to wash your hair, wash your hair first as usual with your  normal  shampoo.  3.  After you shampoo, rinse your hair and body thoroughly to remove the  shampoo.                           4.  Use CHG as you would any other liquid soap.  You can apply chg directly  to the skin and wash                       Gently with a scrungie or clean washcloth.  5.  Apply the CHG Soap to your body ONLY FROM THE NECK DOWN.   Do not use on face/ open                           Wound or open sores. Avoid contact with eyes, ears mouth and genitals (private parts).                       Wash face,  Genitals (private parts) with your normal soap.             6.  Wash thoroughly, paying special attention to the area where your surgery  will be performed.  7.  Thoroughly rinse your body with warm water from the neck down.  8.  DO NOT shower/wash with your normal soap after using and rinsing off  the CHG Soap.                9.  Pat yourself dry with a clean towel.            10.  Wear clean pajamas.            11.  Place clean sheets on your bed the night of your first shower and do not  sleep with pets. Day of Surgery : Do not apply any lotions/deodorants the morning of surgery.  Please wear clean clothes to the hospital/surgery center.  FAILURE TO FOLLOW THESE INSTRUCTIONS MAY RESULT IN THE CANCELLATION OF YOUR SURGERY PATIENT SIGNATURE_________________________________  NURSE SIGNATURE__________________________________  ________________________________________________________________________   Tabitha Wilcox  An incentive spirometer is a tool that can help keep your lungs clear and active. This tool measures how well you are filling your lungs with each breath. Taking long deep breaths may help reverse or decrease the chance of developing breathing (pulmonary) problems (especially infection)  following:  A long period of time when you are unable to move or be active. BEFORE THE PROCEDURE   If the spirometer includes an indicator to show your best effort, your nurse or respiratory therapist will set it to a desired goal.  If possible, sit up straight or lean slightly forward. Try not to slouch.  Hold the incentive spirometer in an upright position. INSTRUCTIONS FOR USE  1. Sit on the edge of your bed if possible, or sit up as far as you can in bed or on a chair. 2. Hold the incentive spirometer in an upright position. 3. Breathe out normally. 4. Place the mouthpiece in your mouth and seal your lips tightly around it. 5. Breathe in slowly and as deeply  as possible, raising the piston or the ball toward the top of the column. 6. Hold your breath for 3-5 seconds or for as long as possible. Allow the piston or ball to fall to the bottom of the column. 7. Remove the mouthpiece from your mouth and breathe out normally. 8. Rest for a few seconds and repeat Steps 1 through 7 at least 10 times every 1-2 hours when you are awake. Take your time and take a few normal breaths between deep breaths. 9. The spirometer may include an indicator to show your best effort. Use the indicator as a goal to work toward during each repetition. 10. After each set of 10 deep breaths, practice coughing to be sure your lungs are clear. If you have an incision (the cut made at the time of surgery), support your incision when coughing by placing a pillow or rolled up towels firmly against it. Once you are able to get out of bed, walk around indoors and cough well. You may stop using the incentive spirometer when instructed by your caregiver.  RISKS AND COMPLICATIONS  Take your time so you do not get dizzy or light-headed.  If you are in pain, you may need to take or ask for pain medication before doing incentive spirometry. It is harder to take a deep breath if you are having pain. AFTER USE  Rest and  breathe slowly and easily.  It can be helpful to keep track of a log of your progress. Your caregiver can provide you with a simple table to help with this. If you are using the spirometer at home, follow these instructions: [place] IF:   You are having difficultly using the spirometer.  You have trouble using the spirometer as often as instructed.  Your pain medication is not giving enough relief while using the spirometer.  You develop fever of 100.5 F (38.1 C) or higher. SEEK IMMEDIATE MEDICAL CARE IF:   You cough up bloody sputum that had not been present before.  You develop fever of 102 F (38.9 C) or greater.  You develop worsening pain at or near the incision site. MAKE SURE YOU:   Understand these instructions.  Will watch your condition.  Will get help right away if you are not doing well or get worse. Document Released: 07/06/2006 Document Revised: 05/18/2011 Document Reviewed: 09/06/2006 [facility] Patient Information 2014 [place], [place].   ________________________________________________________________________

## 2018-12-23 ENCOUNTER — Encounter (HOSPITAL_COMMUNITY): Payer: Self-pay

## 2018-12-23 ENCOUNTER — Encounter (HOSPITAL_COMMUNITY)
Admission: RE | Admit: 2018-12-23 | Discharge: 2018-12-23 | Disposition: A | Discharge status: Home / Self Care | Payer: Medicare Other | Source: Ambulatory Visit | Attending: Orthopedic Surgery | Admitting: Orthopedic Surgery

## 2018-12-23 ENCOUNTER — Other Ambulatory Visit: Payer: Self-pay

## 2018-12-23 ENCOUNTER — Ambulatory Visit (HOSPITAL_COMMUNITY)
Admission: RE | Admit: 2018-12-23 | Discharge: 2018-12-23 | Disposition: A | Discharge status: Home / Self Care | Payer: Medicare Other | Source: Ambulatory Visit | Attending: Orthopedic Surgery | Admitting: Orthopedic Surgery

## 2018-12-23 DIAGNOSIS — Z01811 Encounter for preprocedural respiratory examination: Secondary | ICD-10-CM

## 2018-12-23 DIAGNOSIS — M1712 Unilateral primary osteoarthritis, left knee: Secondary | ICD-10-CM | POA: Diagnosis not present

## 2018-12-23 LAB — URINALYSIS, ROUTINE W REFLEX MICROSCOPIC
Bilirubin Urine: NEGATIVE
Glucose, UA: NEGATIVE mg/dL
Hgb urine dipstick: NEGATIVE
Ketones, ur: 5 mg/dL — AB
Nitrite: NEGATIVE
Protein, ur: NEGATIVE mg/dL
Specific Gravity, Urine: 1.019 (ref 1.005–1.030)
pH: 5 (ref 5.0–8.0)

## 2018-12-23 LAB — CBC WITH DIFFERENTIAL/PLATELET
Abs Immature Granulocytes: 0.05 10*3/uL (ref 0.00–0.07)
Basophils Absolute: 0 10*3/uL (ref 0.0–0.1)
Basophils Relative: 0 %
Eosinophils Absolute: 0.1 10*3/uL (ref 0.0–0.5)
Eosinophils Relative: 1 %
HCT: 41.3 % (ref 36.0–46.0)
Hemoglobin: 12.8 g/dL (ref 12.0–15.0)
Immature Granulocytes: 1 %
Lymphocytes Relative: 30 %
Lymphs Abs: 2.7 10*3/uL (ref 0.7–4.0)
MCH: 27.4 pg (ref 26.0–34.0)
MCHC: 31 g/dL (ref 30.0–36.0)
MCV: 88.4 fL (ref 80.0–100.0)
Monocytes Absolute: 0.4 10*3/uL (ref 0.1–1.0)
Monocytes Relative: 5 %
Neutro Abs: 5.8 10*3/uL (ref 1.7–7.7)
Neutrophils Relative %: 63 %
Platelets: 433 10*3/uL — ABNORMAL HIGH (ref 150–400)
RBC: 4.67 MIL/uL (ref 3.87–5.11)
RDW: 15.5 % (ref 11.5–15.5)
WBC: 9.1 10*3/uL (ref 4.0–10.5)
nRBC: 0 % (ref 0.0–0.2)

## 2018-12-23 LAB — COMPREHENSIVE METABOLIC PANEL
ALT: 26 U/L (ref 0–44)
AST: 25 U/L (ref 15–41)
Albumin: 4.4 g/dL (ref 3.5–5.0)
Alkaline Phosphatase: 131 U/L — ABNORMAL HIGH (ref 38–126)
Anion gap: 11 (ref 5–15)
BUN: 11 mg/dL (ref 8–23)
CO2: 26 mmol/L (ref 22–32)
Calcium: 10.1 mg/dL (ref 8.9–10.3)
Chloride: 104 mmol/L (ref 98–111)
Creatinine, Ser: 0.65 mg/dL (ref 0.44–1.00)
GFR calc Af Amer: >60 mL/min (ref >60)
GFR calc non Af Amer: >60 mL/min (ref >60)
Glucose, Bld: 125 mg/dL — ABNORMAL HIGH (ref 70–99)
Potassium: 4.9 mmol/L (ref 3.5–5.1)
Sodium: 141 mmol/L (ref 135–145)
Total Bilirubin: 0.9 mg/dL (ref 0.3–1.2)
Total Protein: 8.3 g/dL — ABNORMAL HIGH (ref 6.5–8.1)

## 2018-12-23 LAB — APTT: aPTT: 33 seconds (ref 24–36)

## 2018-12-23 LAB — ABO/RH: ABO/RH(D): B POS

## 2018-12-23 LAB — PROTIME-INR
INR: 0.9 (ref 0.8–1.2)
Prothrombin Time: 12.3 seconds (ref 11.4–15.2)

## 2018-12-23 LAB — SURGICAL PCR SCREEN
MRSA, PCR: POSITIVE — AB
Staphylococcus aureus: POSITIVE — AB

## 2018-12-23 NOTE — Progress Notes (Signed)
PCP - Nolene Ebbs, MD Cardiologist -   Chest x-ray -  EKG - done today  Stress Test -  ECHO -  Cardiac Cath -   Sleep Study -  CPAP -   Fasting Blood Sugar -  Checks Blood Sugar _____ times a day  Blood Thinner Instructions: Aspirin Instructions: Last Dose:  Anesthesia review:  N/a    Patient denies shortness of breath, fever, cough and chest pain at PAT appointment   Patient verbalized understanding of instructions that were given to them at the PAT appointment. Patient was also instructed that they will need to review over the PAT instructions again at home before surgery.

## 2018-12-27 ENCOUNTER — Other Ambulatory Visit (HOSPITAL_COMMUNITY)
Admission: RE | Admit: 2018-12-27 | Discharge: 2018-12-27 | Disposition: A | Discharge status: Home / Self Care | Payer: Medicare Other | Source: Ambulatory Visit | Attending: Orthopedic Surgery | Admitting: Orthopedic Surgery

## 2018-12-27 DIAGNOSIS — Z01812 Encounter for preprocedural laboratory examination: Secondary | ICD-10-CM | POA: Diagnosis present

## 2018-12-27 DIAGNOSIS — Z20828 Contact with and (suspected) exposure to other viral communicable diseases: Secondary | ICD-10-CM | POA: Diagnosis not present

## 2018-12-28 LAB — NOVEL CORONAVIRUS, NAA (HOSP ORDER, SEND-OUT TO REF LAB; TAT 18-24 HRS): SARS-CoV-2, NAA: NOT DETECTED

## 2018-12-29 MED ORDER — BUPIVACAINE LIPOSOME 1.3 % IJ SUSP
20.0000 mL | Freq: Once | INTRAMUSCULAR | Status: DC
  Filled 2018-12-29: qty 20

## 2018-12-29 NOTE — H&P (Signed)
TOTAL KNEE ADMISSION H&P  Patient is being admitted for left total knee arthroplasty.  Subjective:  Chief Complaint:left knee pain.  HPI: Tabitha Wilcox, 63 y.o. female, has a history of pain and functional disability in the left knee due to arthritis and has failed non-surgical conservative treatments for greater than 12 weeks to includeNSAID's and/or analgesics, corticosteriod injections, viscosupplementation injections and activity modification.  Onset of symptoms was gradual, starting 5 years ago with gradually worsening course since that time. The patient noted no past surgery on the left knee(s).  Patient currently rates pain in the left knee(s) at 8 out of 10 with activity. Patient has night pain, worsening of pain with activity and weight bearing, pain that interferes with activities of daily living and joint swelling.  Patient has evidence of subchondral cysts, periarticular osteophytes and joint space narrowing by imaging studies. This patient has had failure of all reasonable conservative care. There is no active infection.  Patient Active Problem List   Diagnosis Date Noted  . Spondylolisthesis of lumbosacral region 11/02/2016  . Strain of right upper arm 07/30/2016  . Hepatitis C antibody test positive 02/08/2015  . Thoracic arthritis 12/21/2014  . DJD (degenerative joint disease) of knee 10/20/2014  . Obesity 07/20/2014  . Pre-diabetes 07/20/2014  . Insomnia 07/20/2014  . Cervical spine arthritis ([place]) 03/16/2014  . Family history of glaucoma 10/13/2013  . Toenail deformity 06/14/2013  . Osteopenia 05/08/2013  . Healthcare maintenance 12/19/2012  . Alcohol dependence in remission ([place]) 08/15/2012  . Special screening for malignant neoplasms, colon 08/19/2011  . Medial meniscus tear 07/14/2011  . Tobacco abuse 04/13/2011  . Knee pain, bilateral 03/31/2011  . Ankle pain 03/12/2011  . Esophageal stricture 03/12/2011  . Asthma, chronic 12/26/2010  . GERD (gastroesophageal  reflux disease) 12/26/2010  . Carpal tunnel syndrome on both sides   . Rotator cuff injury   .  lumar surgery  PLIF L5-S1 02/08/2009  . DEPRESSION 12/27/2008   Past Medical History:  Diagnosis Date  . Allergy   . Anxiety   . Arthritis   . Asthma    and bronchitisl reprots her sob is due to her asthma   . Carpal tunnel syndrome on both sides   . Depression   . GERD (gastroesophageal reflux disease)   . Rotator cuff injury    Right side  . Sciatica   . Shortness of breath    sob with exertion   . Tobacco abuse   . Ulcer    1990's    Past Surgical History:  Procedure Laterality Date  . APPENDECTOMY    . APPLICATION OF ROBOTIC ASSISTANCE FOR SPINAL PROCEDURE N/A 11/02/2016   Procedure: APPLICATION OF ROBOTIC ASSISTANCE FOR SPINAL PROCEDURE;  Surgeon: Ditty, Kevan Ny, MD;  Location: [place];  Service: Neurosurgery;  Laterality: N/A;  . EXPLORATORY LAPAROTOMY     reports they puntured something when they were taking the appendix , and had to go back and put in a drain    . IRRIGATION AND DEBRIDEMENT ABSCESS Right 07/05/2012   Procedure: IRRIGATION AND DEBRIDEMENT RIGHT LABIAL/THIGH ABSCESS;  Surgeon: Harl Bowie, MD;  Location: [place];  Service: General;  Laterality: Right;  . SHOULDER ARTHROSCOPY W/ ROTATOR CUFF REPAIR Right 2015  . UPPER GASTROINTESTINAL ENDOSCOPY      Current Facility-Administered Medications  Medication Dose Route Frequency Provider Last Rate Last Dose  . [START ON 12/30/2018] bupivacaine liposome (EXPAREL) 1.3 % injection 266 mg  20 mL Other Once Dorna Leitz, MD  Current Outpatient Medications  Medication Sig Dispense Refill Last Dose  . albuterol (PROAIR HFA) 108 (90 Base) MCG/ACT inhaler Inhale 1-2 puffs into the lungs every 6 (six) hours as needed for wheezing or shortness of breath. 18 g 3   . albuterol (PROVENTIL) (2.5 MG/3ML) 0.083% nebulizer solution Take 3 mLs (2.5 mg total) by nebulization every 6 (six) hours as needed for wheezing or  shortness of breath. 75 mL 12   . Calcium Carbonate-Vitamin D (CALCIUM 600+D PO) Take 1 tablet by mouth daily.     . celecoxib (CELEBREX) 200 MG capsule Take 200 mg by mouth 2 (two) times daily.  5   . cyclobenzaprine (FLEXERIL) 10 MG tablet Take 10 mg by mouth 2 (two) times daily.   10   . fluticasone (FLOVENT HFA) 44 MCG/ACT inhaler Inhale 1 puff into the lungs 2 (two) times daily. (Patient taking differently: Inhale 1 puff into the lungs 2 (two) times daily as needed (shortness of breath). ) 1 Inhaler 2   . gabapentin (NEURONTIN) 300 MG capsule Take 1 capsule (300 mg total) by mouth 3 (three) times daily. 90 capsule 2   . Krill Oil 350 MG CAPS Take 350 mg by mouth daily.      . Multiple Vitamin (MULTIVITAMIN WITH MINERALS) TABS tablet Take 1 tablet by mouth daily. Centrum     . omeprazole (PRILOSEC) 20 MG capsule Take 1 capsule (20 mg total) by mouth daily before lunch. 90 capsule 0   . QUEtiapine (SEROQUEL) 50 MG tablet Take 25 mg by mouth at bedtime as needed (sleep).   5   . traMADol (ULTRAM) 50 MG tablet TAKE 1 OR 2 TABS EVERY 12 HOURS AS NEEDED FOR CHRONIC KNEE PAIN (Patient taking differently: Take 100 mg by mouth 2 (two) times daily. ) 120 tablet 3   . azithromycin (ZITHROMAX) 250 MG tablet Take 1 tablet (250 mg total) by mouth daily. Take first 2 tablets together, then 1 every day until finished. (Patient not taking: Reported on 12/21/2018) 6 tablet 0 Not Taking at Unknown time  . VOLTAREN 1 % GEL Place 2 grams of gel onto the skin over affected area twice a day as needed and discontinue if rash (Patient not taking: Reported on 12/21/2018) 100 g 2 Not Taking at Unknown time   No Known Allergies  Social History   Tobacco Use  . Smoking status: Current Every Day Smoker    Packs/day: 0.25    Years: 25.00    Pack years: 6.25    Types: Cigarettes  . Smokeless tobacco: Never Used  . Tobacco comment: Currently smoking less than 1/4  pack per day depending on how stressed she is..  12-23-2018 confirmd the previous statement   Substance Use Topics  . Alcohol use: Yes    Comment: Martini sometimes.(1 a day) wine cooler    Family History  Problem Relation Age of Onset  . Colon cancer Maternal Aunt   . Colon cancer Paternal Uncle   . Esophageal cancer Neg Hx   . Rectal cancer Neg Hx   . Stomach cancer Neg Hx      ROS ROS: I have reviewed the patient's review of systems thoroughly and there are no positive responses as relates to the HPI. Objective:  Physical Exam  Vital signs in last 24 hours:   Well-developed well-nourished patient in no acute distress. Alert and oriented x3 HEENT:within normal limits Cardiac: Regular rate and rhythm Pulmonary: Lungs clear to auscultation Abdomen: Soft and nontender.  Normal active bowel sounds  Musculoskeletal: (left knee: Pain to range of motion.  Limited range of motion.  No instability.  Trace effusion.  Neurovascular intact distally. Labs: Recent Results (from the past 2160 hour(s))  APTT     Status: None   Collection Time: 12/23/18 11:11 AM  Result Value Ref Range   aPTT 33 24 - 36 seconds    Comment: Performed at [facility], [place] [address] [place], [place] [address]  CBC WITH DIFFERENTIAL     Status: Abnormal   Collection Time: 12/23/18 11:11 AM  Result Value Ref Range   WBC 9.1 4.0 - 10.5 K/uL   RBC 4.67 3.87 - 5.11 MIL/uL   Hemoglobin 12.8 12.0 - 15.0 g/dL   HCT 41.3 36.0 - 46.0 %   MCV 88.4 80.0 - 100.0 fL   MCH 27.4 26.0 - 34.0 pg   MCHC 31.0 30.0 - 36.0 g/dL   RDW 15.5 11.5 - 15.5 %   Platelets 433 (H) 150 - 400 K/uL   nRBC 0.0 0.0 - 0.2 %   Neutrophils Relative % 63 %   Neutro Abs 5.8 1.7 - 7.7 K/uL   Lymphocytes Relative 30 %   Lymphs Abs 2.7 0.7 - 4.0 K/uL   Monocytes Relative 5 %   Monocytes Absolute 0.4 0.1 - 1.0 K/uL   Eosinophils Relative 1 %   Eosinophils Absolute 0.1 0.0 - 0.5 K/uL   Basophils Relative 0 %   Basophils Absolute 0.0 0.0 - 0.1 K/uL   Immature  Granulocytes 1 %   Abs Immature Granulocytes 0.05 0.00 - 0.07 K/uL    Comment: Performed at [facility], [place] [address] [place], [place] [address]  Comprehensive metabolic panel     Status: Abnormal   Collection Time: 12/23/18 11:11 AM  Result Value Ref Range   Sodium 141 135 - 145 mmol/L   Potassium 4.9 3.5 - 5.1 mmol/L   Chloride 104 98 - 111 mmol/L   CO2 26 22 - 32 mmol/L   Glucose, Bld 125 (H) 70 - 99 mg/dL   BUN 11 8 - 23 mg/dL   Creatinine, Ser 0.65 0.44 - 1.00 mg/dL   Calcium 10.1 8.9 - 10.3 mg/dL   Total Protein 8.3 (H) 6.5 - 8.1 g/dL   Albumin 4.4 3.5 - 5.0 g/dL   AST 25 15 - 41 U/L   ALT 26 0 - 44 U/L   Alkaline Phosphatase 131 (H) 38 - 126 U/L   Total Bilirubin 0.9 0.3 - 1.2 mg/dL   GFR calc non Af Amer >60 >60 mL/min   GFR calc Af Amer >60 >60 mL/min   Anion gap 11 5 - 15    Comment: Performed at [facility], [place] [address] [place], [place] [address]  Protime-INR     Status: None   Collection Time: 12/23/18 11:11 AM  Result Value Ref Range   Prothrombin Time 12.3 11.4 - 15.2 seconds   INR 0.9 0.8 - 1.2    Comment: (NOTE) INR goal varies based on device and disease states. Performed at [facility], [place] [address], [place],  [address]   Type and screen Order type and screen if day of surgery is less than 15 days from draw of preadmission visit or order morning of surgery if day of surgery is greater than 6 days from preadmission visit.     Status: None   Collection Time: 12/23/18 11:11 AM  Result Value Ref  Range   ABO/RH(D) B POS    Antibody Screen NEG    Sample Expiration 01/06/2019,2359    Extend sample reason      NO TRANSFUSIONS OR PREGNANCY IN THE PAST 3 MONTHS Performed at [facility], [place] [address] [place], [place] [address]   Urinalysis, Routine w reflex microscopic     Status: Abnormal   Collection Time: 12/23/18 11:11 AM  Result Value Ref Range   Color, Urine  AMBER (A) YELLOW    Comment: BIOCHEMICALS MAY BE AFFECTED BY COLOR   APPearance CLOUDY (A) CLEAR   Specific Gravity, Urine 1.019 1.005 - 1.030   pH 5.0 5.0 - 8.0   Glucose, UA NEGATIVE NEGATIVE mg/dL   Hgb urine dipstick NEGATIVE NEGATIVE   Bilirubin Urine NEGATIVE NEGATIVE   Ketones, ur 5 (A) NEGATIVE mg/dL   Protein, ur NEGATIVE NEGATIVE mg/dL   Nitrite NEGATIVE NEGATIVE   Leukocytes,Ua TRACE (A) NEGATIVE   RBC / HPF 0-5 0 - 5 RBC/hpf   WBC, UA 0-5 0 - 5 WBC/hpf   Bacteria, UA RARE (A) NONE SEEN   Squamous Epithelial / LPF 6-10 0 - 5   Mucus PRESENT    Hyaline Casts, UA PRESENT     Comment: Performed at [facility], [place] [address], [place], [place] [address]  Surgical pcr screen     Status: Abnormal   Collection Time: 12/23/18 11:11 AM   Specimen: Nasal Mucosa; Nasal Swab  Result Value Ref Range   MRSA, PCR POSITIVE (A) NEGATIVE    Comment: RESULT CALLED TO, READ BACK BY AND VERIFIED WITH: I EWURUM,RN [id number] @ [id number] BY J SCOTTON    Staphylococcus aureus POSITIVE (A) NEGATIVE    Comment: (NOTE) The Xpert SA Assay (FDA approved for NASAL specimens in patients 25 years of age and older), is one component of a comprehensive surveillance program. It is not intended to diagnose infection nor to guide or monitor treatment. Performed at [facility], [place] [address], [place], [place] [address]   ABO/Rh     Status: None   Collection Time: 12/23/18 11:11 AM  Result Value Ref Range   ABO/RH(D)      B POS Performed at [facility], [place] [address], [place], [place] [address]   Novel Coronavirus, NAA [place] order, Send-out to Ref Lab; TAT 18-24 hrs     Status: None   Collection Time: 12/27/18  9:52 AM   Specimen: Nasopharyngeal Swab; Respiratory  Result Value Ref Range   SARS-CoV-2, NAA NOT DETECTED NOT DETECTED    Comment: (NOTE) This nucleic acid amplification test was developed and its performance characteristics  determined by [employer]. Nucleic acid amplification tests include PCR and TMA. This test has not been FDA cleared or approved. This test has been authorized by FDA under an Emergency Use Authorization (EUA). This test is only authorized for the duration of time the declaration that circumstances exist justifying the authorization of the emergency use of in vitro diagnostic tests for detection of SARS-CoV-2 virus and/or diagnosis of COVID-19 infection under section 564(b)(1) of the Act, 21 U.S.C. [id number]) (1), unless the authorization is terminated or revoked sooner. When diagnostic testing is negative, the possibility of a false negative result should be considered in the context of a patient's recent exposures and the presence of clinical signs and symptoms consistent with COVID-19. An individual without symptoms of COVID- 19 and who is not shedding SARS-CoV-2 vi rus would expect to have a  negative (not detected) result in this assay. Performed At: [facility] [place], [place] [id number] Rush Farmer MD [id number]    Coronavirus Source NASOPHARYNGEAL     Comment: Performed at [facility], [place] [address], [place], [place] [address]    Estimated body mass index is 35.85 kg/m as calculated from the following:   Height as of 12/23/18: 5\' 2"  (1.575 m).   Weight as of 12/23/18: 88.9 kg.   Imaging Review Plain radiographs demonstrate severe degenerative joint disease of the left knee(s). The overall alignment ismild varus. The bone quality appears to be fair for age and reported activity level.      Assessment/Plan:  End stage arthritis, left knee   The patient history, physical examination, clinical judgment of the provider and imaging studies are consistent with end stage degenerative joint disease of the left knee(s) and total knee arthroplasty is deemed medically necessary. The treatment options including medical management,  injection therapy arthroscopy and arthroplasty were discussed at length. The risks and benefits of total knee arthroplasty were presented and reviewed. The risks due to aseptic loosening, infection, stiffness, patella tracking problems, thromboembolic complications and other imponderables were discussed. The patient acknowledged the explanation, agreed to proceed with the plan and consent was signed. Patient is being admitted for inpatient treatment for surgery, pain control, PT, OT, prophylactic antibiotics, VTE prophylaxis, progressive ambulation and ADL's and discharge planning. The patient is planning to be discharged home with home health services     Patient's anticipated LOS is less than 2 midnights, meeting these requirements: - Younger than 28 - Lives within 1 hour of care - Has a competent adult at home to recover with post-op recover - NO history of  - Chronic pain requiring opiods  - Diabetes  - Coronary Artery Disease  - Heart failure  - Heart attack  - Stroke  - DVT/VTE  - Cardiac arrhythmia  - Respiratory Failure/COPD  - Renal failure  - Anemia  - Advanced Liver disease

## 2018-12-30 ENCOUNTER — Telehealth (HOSPITAL_COMMUNITY): Payer: Self-pay | Admitting: *Deleted

## 2018-12-30 ENCOUNTER — Ambulatory Visit (HOSPITAL_COMMUNITY): Payer: Medicare Other | Admitting: Certified Registered"

## 2018-12-30 ENCOUNTER — Ambulatory Visit (HOSPITAL_COMMUNITY)
Admission: RE | Admit: 2018-12-30 | Discharge: 2018-12-31 | Disposition: A | Discharge status: Home Health | Payer: Medicare Other | Source: Other Acute Inpatient Hospital | Attending: Orthopedic Surgery | Admitting: Orthopedic Surgery

## 2018-12-30 ENCOUNTER — Ambulatory Visit (HOSPITAL_COMMUNITY): Payer: Medicare Other | Admitting: Physician Assistant

## 2018-12-30 ENCOUNTER — Other Ambulatory Visit: Payer: Self-pay

## 2018-12-30 ENCOUNTER — Encounter (HOSPITAL_COMMUNITY)
Admission: RE | Disposition: A | Discharge status: Home Health | Payer: Self-pay | Source: Other Acute Inpatient Hospital | Attending: Orthopedic Surgery

## 2018-12-30 ENCOUNTER — Encounter (HOSPITAL_COMMUNITY): Payer: Self-pay

## 2018-12-30 DIAGNOSIS — M25762 Osteophyte, left knee: Secondary | ICD-10-CM | POA: Insufficient documentation

## 2018-12-30 DIAGNOSIS — Z7951 Long term (current) use of inhaled steroids: Secondary | ICD-10-CM | POA: Diagnosis not present

## 2018-12-30 DIAGNOSIS — M1712 Unilateral primary osteoarthritis, left knee: Secondary | ICD-10-CM | POA: Diagnosis not present

## 2018-12-30 DIAGNOSIS — Z791 Long term (current) use of non-steroidal anti-inflammatories (NSAID): Secondary | ICD-10-CM | POA: Diagnosis not present

## 2018-12-30 DIAGNOSIS — F17210 Nicotine dependence, cigarettes, uncomplicated: Secondary | ICD-10-CM | POA: Insufficient documentation

## 2018-12-30 DIAGNOSIS — Z79899 Other long term (current) drug therapy: Secondary | ICD-10-CM | POA: Insufficient documentation

## 2018-12-30 DIAGNOSIS — F329 Major depressive disorder, single episode, unspecified: Secondary | ICD-10-CM | POA: Diagnosis not present

## 2018-12-30 DIAGNOSIS — J45909 Unspecified asthma, uncomplicated: Secondary | ICD-10-CM | POA: Insufficient documentation

## 2018-12-30 DIAGNOSIS — M25562 Pain in left knee: Secondary | ICD-10-CM | POA: Diagnosis present

## 2018-12-30 HISTORY — PX: TOTAL KNEE ARTHROPLASTY: SHX125

## 2018-12-30 LAB — TYPE AND SCREEN
ABO/RH(D): B POS
Antibody Screen: NEGATIVE

## 2018-12-30 SURGERY — ARTHROPLASTY, KNEE, TOTAL
Anesthesia: Spinal | Site: Knee | Laterality: Left

## 2018-12-30 MED ORDER — SODIUM CHLORIDE 0.9 % IV SOLN
INTRAVENOUS | Status: DC
  Administered 2018-12-30: 20:00:00 via INTRAVENOUS

## 2018-12-30 MED ORDER — SODIUM CHLORIDE (PF) 0.9 % IJ SOLN
INTRAMUSCULAR | Status: AC
  Filled 2018-12-30: qty 50

## 2018-12-30 MED ORDER — CEFAZOLIN SODIUM-DEXTROSE 2-4 GM/100ML-% IV SOLN
2.0000 g | INTRAVENOUS | Status: AC
  Administered 2018-12-30: 2 g via INTRAVENOUS
  Filled 2018-12-30: qty 100

## 2018-12-30 MED ORDER — WATER FOR IRRIGATION, STERILE IR SOLN
Status: DC | PRN
  Administered 2018-12-30: 2000 mL

## 2018-12-30 MED ORDER — PROPOFOL 10 MG/ML IV BOLUS
INTRAVENOUS | Status: DC | PRN
  Administered 2018-12-30: 10 mg via INTRAVENOUS
  Administered 2018-12-30: 20 mg via INTRAVENOUS

## 2018-12-30 MED ORDER — OXYCODONE HCL 5 MG PO TABS
5.0000 mg | ORAL_TABLET | ORAL | Status: DC | PRN
  Administered 2018-12-30 – 2018-12-31 (×4): 10 mg via ORAL
  Filled 2018-12-30 (×4): qty 2

## 2018-12-30 MED ORDER — CHLORHEXIDINE GLUCONATE 4 % EX LIQD: 60.0000 mL | Freq: Once | CUTANEOUS | Status: DC

## 2018-12-30 MED ORDER — POLYETHYLENE GLYCOL 3350 17 G PO PACK: 17.0000 g | PACK | Freq: Every day | ORAL | Status: DC | PRN

## 2018-12-30 MED ORDER — SODIUM CHLORIDE 0.9% FLUSH
INTRAVENOUS | Status: DC | PRN
  Administered 2018-12-30: 50 mL

## 2018-12-30 MED ORDER — VANCOMYCIN HCL IN DEXTROSE 1-5 GM/200ML-% IV SOLN
1000.0000 mg | INTRAVENOUS | Status: AC
  Administered 2018-12-30: 1000 mg via INTRAVENOUS
  Filled 2018-12-30: qty 200

## 2018-12-30 MED ORDER — ROPIVACAINE HCL 7.5 MG/ML IJ SOLN
INTRAMUSCULAR | Status: DC | PRN
  Administered 2018-12-30: 20 mL via PERINEURAL

## 2018-12-30 MED ORDER — BUPIVACAINE IN DEXTROSE 0.75-8.25 % IT SOLN
INTRATHECAL | Status: DC | PRN
  Administered 2018-12-30: 1.6 mL via INTRATHECAL

## 2018-12-30 MED ORDER — DEXAMETHASONE SODIUM PHOSPHATE 10 MG/ML IJ SOLN
10.0000 mg | Freq: Two times a day (BID) | INTRAMUSCULAR | Status: DC
  Administered 2018-12-31: 10 mg via INTRAVENOUS
  Filled 2018-12-30 (×2): qty 1

## 2018-12-30 MED ORDER — 0.9 % SODIUM CHLORIDE (POUR BTL) OPTIME
TOPICAL | Status: DC | PRN
  Administered 2018-12-30: 13:00:00 1000 mL

## 2018-12-30 MED ORDER — ONDANSETRON HCL 4 MG/2ML IJ SOLN
INTRAMUSCULAR | Status: AC
  Filled 2018-12-30: qty 2

## 2018-12-30 MED ORDER — DOCUSATE SODIUM 100 MG PO CAPS
100.0000 mg | ORAL_CAPSULE | Freq: Two times a day (BID) | ORAL | Status: DC
  Administered 2018-12-30 – 2018-12-31 (×2): 100 mg via ORAL
  Filled 2018-12-30 (×2): qty 1

## 2018-12-30 MED ORDER — PROPOFOL 500 MG/50ML IV EMUL
INTRAVENOUS | Status: AC
  Filled 2018-12-30: qty 50

## 2018-12-30 MED ORDER — FENTANYL CITRATE (PF) 100 MCG/2ML IJ SOLN: 25.0000 ug | INTRAMUSCULAR | Status: DC | PRN

## 2018-12-30 MED ORDER — PROPOFOL 10 MG/ML IV BOLUS
INTRAVENOUS | Status: AC
  Filled 2018-12-30: qty 20

## 2018-12-30 MED ORDER — OXYCODONE-ACETAMINOPHEN 5-325 MG PO TABS: 1.0000 | ORAL_TABLET | Freq: Four times a day (QID) | ORAL | 0 refills | Status: DC | PRN

## 2018-12-30 MED ORDER — PANTOPRAZOLE SODIUM 40 MG PO TBEC
40.0000 mg | DELAYED_RELEASE_TABLET | Freq: Every day | ORAL | Status: DC
  Administered 2018-12-30 – 2018-12-31 (×2): 40 mg via ORAL
  Filled 2018-12-30 (×2): qty 1

## 2018-12-30 MED ORDER — FENTANYL CITRATE (PF) 100 MCG/2ML IJ SOLN
INTRAMUSCULAR | Status: AC
  Filled 2018-12-30: qty 2

## 2018-12-30 MED ORDER — BISACODYL 5 MG PO TBEC: 5.0000 mg | DELAYED_RELEASE_TABLET | Freq: Every day | ORAL | Status: DC | PRN

## 2018-12-30 MED ORDER — BUDESONIDE 0.25 MG/2ML IN SUSP
0.2500 mg | Freq: Two times a day (BID) | RESPIRATORY_TRACT | Status: DC
  Administered 2018-12-30 – 2018-12-31 (×2): 0.25 mg via RESPIRATORY_TRACT
  Filled 2018-12-30 (×2): qty 2

## 2018-12-30 MED ORDER — GABAPENTIN 300 MG PO CAPS
300.0000 mg | ORAL_CAPSULE | Freq: Three times a day (TID) | ORAL | Status: DC
  Administered 2018-12-30 – 2018-12-31 (×3): 300 mg via ORAL
  Filled 2018-12-30 (×3): qty 1

## 2018-12-30 MED ORDER — METOCLOPRAMIDE HCL 5 MG/ML IJ SOLN: 10.0000 mg | Freq: Once | INTRAMUSCULAR | Status: DC | PRN

## 2018-12-30 MED ORDER — VANCOMYCIN HCL IN DEXTROSE 1-5 GM/200ML-% IV SOLN
1000.0000 mg | Freq: Two times a day (BID) | INTRAVENOUS | Status: AC
  Administered 2018-12-30 – 2018-12-31 (×2): 1000 mg via INTRAVENOUS
  Filled 2018-12-30 (×2): qty 200

## 2018-12-30 MED ORDER — ALBUTEROL SULFATE (2.5 MG/3ML) 0.083% IN NEBU: 2.5000 mg | INHALATION_SOLUTION | Freq: Four times a day (QID) | RESPIRATORY_TRACT | Status: DC | PRN

## 2018-12-30 MED ORDER — FENTANYL CITRATE (PF) 100 MCG/2ML IJ SOLN
INTRAMUSCULAR | Status: DC | PRN
  Administered 2018-12-30: 50 ug via INTRAVENOUS

## 2018-12-30 MED ORDER — TRANEXAMIC ACID-NACL 1000-0.7 MG/100ML-% IV SOLN
1000.0000 mg | INTRAVENOUS | Status: AC
  Administered 2018-12-30: 1000 mg via INTRAVENOUS
  Filled 2018-12-30: qty 100

## 2018-12-30 MED ORDER — MIDAZOLAM HCL 2 MG/2ML IJ SOLN
1.0000 mg | INTRAMUSCULAR | Status: DC
  Administered 2018-12-30: 2 mg via INTRAVENOUS
  Filled 2018-12-30: qty 2

## 2018-12-30 MED ORDER — METHOCARBAMOL 500 MG IVPB - SIMPLE MED
500.0000 mg | Freq: Four times a day (QID) | INTRAVENOUS | Status: DC | PRN
  Filled 2018-12-30: qty 50

## 2018-12-30 MED ORDER — DEXAMETHASONE SODIUM PHOSPHATE 10 MG/ML IJ SOLN
INTRAMUSCULAR | Status: DC | PRN
  Administered 2018-12-30: 8 mg via INTRAVENOUS

## 2018-12-30 MED ORDER — ASPIRIN EC 325 MG PO TBEC
325.0000 mg | DELAYED_RELEASE_TABLET | Freq: Two times a day (BID) | ORAL | Status: DC
  Administered 2018-12-31: 325 mg via ORAL
  Filled 2018-12-30 (×2): qty 1

## 2018-12-30 MED ORDER — ALBUTEROL SULFATE HFA 108 (90 BASE) MCG/ACT IN AERS: 1.0000 | INHALATION_SPRAY | Freq: Four times a day (QID) | RESPIRATORY_TRACT | Status: DC | PRN

## 2018-12-30 MED ORDER — ONDANSETRON HCL 4 MG/2ML IJ SOLN
INTRAMUSCULAR | Status: DC | PRN
  Administered 2018-12-30: 4 mg via INTRAVENOUS

## 2018-12-30 MED ORDER — TRANEXAMIC ACID-NACL 1000-0.7 MG/100ML-% IV SOLN
1000.0000 mg | Freq: Once | INTRAVENOUS | Status: AC
  Administered 2018-12-30: 1000 mg via INTRAVENOUS
  Filled 2018-12-30: qty 100

## 2018-12-30 MED ORDER — BUPIVACAINE LIPOSOME 1.3 % IJ SUSP
INTRAMUSCULAR | Status: DC | PRN
  Administered 2018-12-30: 20 mL

## 2018-12-30 MED ORDER — BUPIVACAINE HCL (PF) 0.25 % IJ SOLN
INTRAMUSCULAR | Status: AC
  Filled 2018-12-30: qty 30

## 2018-12-30 MED ORDER — METHOCARBAMOL 500 MG PO TABS
500.0000 mg | ORAL_TABLET | Freq: Four times a day (QID) | ORAL | Status: DC | PRN
  Administered 2018-12-30: 500 mg via ORAL
  Filled 2018-12-30: qty 1

## 2018-12-30 MED ORDER — POVIDONE-IODINE 10 % EX SWAB
2.0000 "application " | Freq: Once | CUTANEOUS | Status: AC
  Administered 2018-12-30: 2 via TOPICAL

## 2018-12-30 MED ORDER — DIPHENHYDRAMINE HCL 12.5 MG/5ML PO ELIX
12.5000 mg | ORAL_SOLUTION | ORAL | Status: DC | PRN
  Administered 2018-12-30: 25 mg via ORAL
  Filled 2018-12-30: qty 10

## 2018-12-30 MED ORDER — SODIUM CHLORIDE 0.9 % IR SOLN
Status: DC | PRN
  Administered 2018-12-30: 1000 mL

## 2018-12-30 MED ORDER — DEXAMETHASONE SODIUM PHOSPHATE 10 MG/ML IJ SOLN
INTRAMUSCULAR | Status: AC
  Filled 2018-12-30: qty 1

## 2018-12-30 MED ORDER — FENTANYL CITRATE (PF) 100 MCG/2ML IJ SOLN
50.0000 ug | INTRAMUSCULAR | Status: DC
  Administered 2018-12-30: 100 ug via INTRAVENOUS
  Filled 2018-12-30: qty 2

## 2018-12-30 MED ORDER — ACETAMINOPHEN 325 MG PO TABS: 325.0000 mg | ORAL_TABLET | Freq: Four times a day (QID) | ORAL | Status: DC | PRN

## 2018-12-30 MED ORDER — LACTATED RINGERS IV SOLN
INTRAVENOUS | Status: DC
  Administered 2018-12-30 (×2): via INTRAVENOUS

## 2018-12-30 MED ORDER — QUETIAPINE FUMARATE 25 MG PO TABS: 25.0000 mg | ORAL_TABLET | Freq: Every evening | ORAL | Status: DC | PRN

## 2018-12-30 MED ORDER — ASPIRIN EC 325 MG PO TBEC: 325.0000 mg | DELAYED_RELEASE_TABLET | Freq: Two times a day (BID) | ORAL | 0 refills | Status: DC

## 2018-12-30 MED ORDER — HYDROMORPHONE HCL 1 MG/ML IJ SOLN: 0.5000 mg | INTRAMUSCULAR | Status: DC | PRN

## 2018-12-30 MED ORDER — MEPERIDINE HCL 50 MG/ML IJ SOLN: 6.2500 mg | INTRAMUSCULAR | Status: DC | PRN

## 2018-12-30 MED ORDER — ONDANSETRON HCL 4 MG PO TABS: 4.0000 mg | ORAL_TABLET | Freq: Four times a day (QID) | ORAL | Status: DC | PRN

## 2018-12-30 MED ORDER — BUPIVACAINE HCL 0.25 % IJ SOLN
INTRAMUSCULAR | Status: DC | PRN
  Administered 2018-12-30: 30 mL

## 2018-12-30 MED ORDER — ONDANSETRON HCL 4 MG/2ML IJ SOLN: 4.0000 mg | Freq: Four times a day (QID) | INTRAMUSCULAR | Status: DC | PRN

## 2018-12-30 MED ORDER — ALUM & MAG HYDROXIDE-SIMETH 200-200-20 MG/5ML PO SUSP: 30.0000 mL | ORAL | Status: DC | PRN

## 2018-12-30 MED ORDER — LACTATED RINGERS IV SOLN: INTRAVENOUS | Status: DC

## 2018-12-30 MED ORDER — MAGNESIUM CITRATE PO SOLN: 1.0000 | Freq: Once | ORAL | Status: DC | PRN

## 2018-12-30 MED ORDER — DOCUSATE SODIUM 100 MG PO CAPS: 100.0000 mg | ORAL_CAPSULE | Freq: Two times a day (BID) | ORAL | 0 refills | Status: DC

## 2018-12-30 MED ORDER — PROPOFOL 500 MG/50ML IV EMUL
INTRAVENOUS | Status: DC | PRN
  Administered 2018-12-30: 135 ug/kg/min via INTRAVENOUS

## 2018-12-30 SURGICAL SUPPLY — 64 items
APL SKNCLS STERI-STRIP NONHPOA (GAUZE/BANDAGES/DRESSINGS) ×1
ATTUNE MED DOME PAT 38 KNEE (Knees) ×1 IMPLANT
ATTUNE MED DOME PAT 38MM KNEE (Knees) ×1 IMPLANT
ATTUNE PS FEM LT SZ 4 CEM KNEE (Femur) ×2 IMPLANT
ATTUNE PSRP INSR SZ4 7 KNEE (Insert) ×1 IMPLANT
ATTUNE PSRP INSR SZ4 7MM KNEE (Insert) ×1 IMPLANT
BAG SPEC THK2 15X12 ZIP CLS (MISCELLANEOUS) ×1
BAG ZIPLOCK 12X15 (MISCELLANEOUS) ×3 IMPLANT
BASE TIBIAL ROT PLAT SZ 5 KNEE (Knees) IMPLANT
BENZOIN TINCTURE PRP APPL 2/3 (GAUZE/BANDAGES/DRESSINGS) ×3 IMPLANT
BLADE SAGITTAL 25.0X1.19X90 (BLADE) ×2 IMPLANT
BLADE SAGITTAL 25.0X1.19X90MM (BLADE) ×1
BLADE SAW SGTL 13.0X1.19X90.0M (BLADE) ×3 IMPLANT
BLADE SURG SZ10 CARB STEEL (BLADE) ×6 IMPLANT
BNDG CMPR MED 10X6 ELC LF (GAUZE/BANDAGES/DRESSINGS) ×1
BNDG ELASTIC 6X10 VLCR STRL LF (GAUZE/BANDAGES/DRESSINGS) ×2 IMPLANT
BNDG ELASTIC 6X5.8 VLCR STR LF (GAUZE/BANDAGES/DRESSINGS) ×3 IMPLANT
BOOTIES KNEE HIGH SLOAN (MISCELLANEOUS) ×3 IMPLANT
BOWL SMART MIX CTS (DISPOSABLE) ×3 IMPLANT
BSPLAT TIB 5 CMNT ROT PLAT STR (Knees) ×1 IMPLANT
CEMENT HV SMART SET (Cement) ×6 IMPLANT
CLOSURE STERI-STRIP 1/2X4 (GAUZE/BANDAGES/DRESSINGS) ×1
CLOSURE WOUND 1/2 X4 (GAUZE/BANDAGES/DRESSINGS)
CLSR STERI-STRIP ANTIMIC 1/2X4 (GAUZE/BANDAGES/DRESSINGS) ×1 IMPLANT
COVER SURGICAL LIGHT HANDLE (MISCELLANEOUS) ×3 IMPLANT
COVER WAND RF STERILE (DRAPES) IMPLANT
CUFF TOURN SGL QUICK 34 (TOURNIQUET CUFF) ×3
CUFF TRNQT CYL 34X4.125X (TOURNIQUET CUFF) ×1 IMPLANT
DECANTER SPIKE VIAL GLASS SM (MISCELLANEOUS) ×6 IMPLANT
DRAPE U-SHAPE 47X51 STRL (DRAPES) ×3 IMPLANT
DRSG AQUACEL AG ADV 3.5X 6 (GAUZE/BANDAGES/DRESSINGS) IMPLANT
DRSG AQUACEL AG ADV 3.5X10 (GAUZE/BANDAGES/DRESSINGS) ×3 IMPLANT
DURAPREP 26ML APPLICATOR (WOUND CARE) ×3 IMPLANT
ELECT REM PT RETURN 15FT ADLT (MISCELLANEOUS) ×3 IMPLANT
GLOVE BIOGEL PI IND STRL 8 (GLOVE) ×2 IMPLANT
GLOVE BIOGEL PI INDICATOR 8 (GLOVE) ×4
GLOVE ECLIPSE 7.5 STRL STRAW (GLOVE) ×6 IMPLANT
GOWN STRL REUS W/TWL XL LVL3 (GOWN DISPOSABLE) ×6 IMPLANT
HANDPIECE INTERPULSE COAX TIP (DISPOSABLE) ×3
HOLDER FOLEY CATH W/STRAP (MISCELLANEOUS) IMPLANT
HOOD PEEL AWAY FLYTE STAYCOOL (MISCELLANEOUS) ×9 IMPLANT
KIT TURNOVER KIT A (KITS) IMPLANT
MANIFOLD NEPTUNE II (INSTRUMENTS) ×3 IMPLANT
NEEDLE HYPO 22GX1.5 SAFETY (NEEDLE) ×3 IMPLANT
NS IRRIG 1000ML POUR BTL (IV SOLUTION) ×3 IMPLANT
PACK ICE MAXI GEL EZY WRAP (MISCELLANEOUS) ×3 IMPLANT
PACK TOTAL KNEE CUSTOM (KITS) ×3 IMPLANT
PADDING CAST COTTON 6X4 STRL (CAST SUPPLIES) ×3 IMPLANT
PIN DRILL FIX HALF THREAD (BIT) ×2 IMPLANT
PIN STEINMAN FIXATION KNEE (PIN) ×2 IMPLANT
PROTECTOR NERVE ULNAR (MISCELLANEOUS) ×3 IMPLANT
SET HNDPC FAN SPRY TIP SCT (DISPOSABLE) ×1 IMPLANT
STRIP CLOSURE SKIN 1/2X4 (GAUZE/BANDAGES/DRESSINGS) IMPLANT
SUT MNCRL AB 3-0 PS2 18 (SUTURE) ×3 IMPLANT
SUT VIC AB 0 CT1 36 (SUTURE) ×3 IMPLANT
SUT VIC AB 1 CT1 36 (SUTURE) ×6 IMPLANT
SUT VIC AB 2-0 CT1 27 (SUTURE) ×3
SUT VIC AB 2-0 CT1 TAPERPNT 27 (SUTURE) IMPLANT
SYR CONTROL 10ML LL (SYRINGE) ×6 IMPLANT
TIBIAL BASE ROT PLAT SZ 5 KNEE (Knees) ×3 IMPLANT
TRAY FOLEY MTR SLVR 16FR STAT (SET/KITS/TRAYS/PACK) ×3 IMPLANT
WATER STERILE IRR 1000ML POUR (IV SOLUTION) ×6 IMPLANT
WRAP KNEE MAXI GEL POST OP (GAUZE/BANDAGES/DRESSINGS) ×2 IMPLANT
YANKAUER SUCT BULB TIP 10FT TU (MISCELLANEOUS) ×3 IMPLANT

## 2018-12-30 NOTE — Anesthesia Procedure Notes (Signed)
Spinal  Patient location during procedure: OR Start time: 12/30/2018 12:43 PM End time: 12/30/2018 12:47 PM Staffing Resident/CRNA: Niel Hummer, CRNA Performed: resident/CRNA  Preanesthetic Checklist Completed: patient identified, surgical consent, pre-op evaluation, IV checked, risks and benefits discussed and monitors and equipment checked Spinal Block Patient position: sitting Prep: DuraPrep Patient monitoring: heart rate, continuous pulse ox and blood pressure Approach: midline Location: L3-4 Injection technique: single-shot Needle Needle type: Pencan  Needle gauge: 24 G Needle length: 9 cm

## 2018-12-30 NOTE — Transfer of Care (Signed)
Immediate Anesthesia Transfer of Care Note  Patient: Tabitha Wilcox  Procedure(s) Performed: TOTAL KNEE ARTHROPLASTY (Left Knee)  Patient Location: PACU  Anesthesia Type:Spinal  Level of Consciousness: awake, alert  and oriented  Airway & Oxygen Therapy: Patient Spontanous Breathing and Patient connected to nasal cannula oxygen  Post-op Assessment: Report given to RN and Post -op Vital signs reviewed and stable  Post vital signs: Reviewed and stable  Last Vitals:  Vitals Value Taken Time  BP    Temp    Pulse 91 12/30/18 1439  Resp 30 12/30/18 1439  SpO2 100 % 12/30/18 1439  Vitals shown include unvalidated device data.  Last Pain:  Vitals:   12/30/18 1150  TempSrc:   PainSc: 0-No pain         Complications: No apparent anesthesia complications

## 2018-12-30 NOTE — Anesthesia Postprocedure Evaluation (Signed)
Anesthesia Post Note  Patient: Tabitha Wilcox  Procedure(s) Performed: TOTAL KNEE ARTHROPLASTY (Left Knee)     Patient location during evaluation: PACU Anesthesia Type: Spinal Level of consciousness: awake and alert Pain management: pain level controlled Vital Signs Assessment: post-procedure vital signs reviewed and stable Respiratory status: spontaneous breathing, nonlabored ventilation, respiratory function stable and patient connected to nasal cannula oxygen Cardiovascular status: stable and blood pressure returned to baseline Postop Assessment: no apparent nausea or vomiting Anesthetic complications: no    Last Vitals:  Vitals:   12/30/18 1530 12/30/18 1545  BP: 118/75 (!) 120/94  Pulse: 86 87  Resp: 13 20  Temp:    SpO2: 97% 100%    Last Pain:  Vitals:   12/30/18 1545  TempSrc:   PainSc: 0-No pain                 Montez Hageman

## 2018-12-30 NOTE — Progress Notes (Signed)
Assisted Dr. Marcell Barlow with left, ultrasound guided, adductor canal block. Side rails up, monitors on throughout procedure. See vital signs in flow sheet. Tolerated Procedure well.

## 2018-12-30 NOTE — Anesthesia Procedure Notes (Signed)
Procedure Name: MAC Date/Time: 12/30/2018 12:42 PM Performed by: Niel Hummer, CRNA Pre-anesthesia Checklist: Patient identified, Emergency Drugs available, Suction available and Patient being monitored Patient Re-evaluated:Patient Re-evaluated prior to induction Oxygen Delivery Method: Simple face mask

## 2018-12-30 NOTE — Anesthesia Procedure Notes (Signed)
Anesthesia Regional Block: Adductor canal block   Pre-Anesthetic Checklist: ,, timeout performed, Correct Patient, Correct Site, Correct Laterality, Correct Procedure, Correct Position, site marked, Risks and benefits discussed,  Surgical consent,  Pre-op evaluation,  At surgeon's request and post-op pain management  Laterality: Left and Lower  Prep: Maximum Sterile Barrier Precautions used, chloraprep       Needles:  Injection technique: Single-shot  Needle Type: Echogenic Stimulator Needle     Needle Length: 10cm      Additional Needles:   Procedures:,,,, ultrasound used (permanent image in chart),,,,  Narrative:  Start time: 12/30/2018 11:39 AM End time: 12/30/2018 11:45 AM Injection made incrementally with aspirations every 5 mL.  Performed by: Personally  Anesthesiologist: Montez Hageman, MD  Additional Notes: Risks, benefits and alternative to block explained extensively.  Patient tolerated procedure well, without complications.

## 2018-12-30 NOTE — Anesthesia Preprocedure Evaluation (Signed)
Anesthesia Evaluation  Patient identified by MRN, date of birth, ID band Patient awake    Reviewed: Allergy & Precautions, NPO status , Patient's Chart, lab work & pertinent test results  Airway Mallampati: II  TM Distance: >3 FB Neck ROM: Full    Dental no notable dental hx.    Pulmonary asthma , Current Smoker,    Pulmonary exam normal breath sounds clear to auscultation       Cardiovascular negative cardio ROS Normal cardiovascular exam Rhythm:Regular Rate:Normal     Neuro/Psych negative neurological ROS  negative psych ROS   GI/Hepatic Neg liver ROS, GERD  Controlled,  Endo/Other  negative endocrine ROS  Renal/GU negative Renal ROS  negative genitourinary   Musculoskeletal negative musculoskeletal ROS (+)   Abdominal   Peds negative pediatric ROS (+)  Hematology negative hematology ROS (+)   Anesthesia Other Findings   Reproductive/Obstetrics negative OB ROS                            Anesthesia Physical Anesthesia Plan  ASA: II  Anesthesia Plan: Spinal   Post-op Pain Management:  Regional for Post-op pain   Induction:   PONV Risk Score and Plan: 1 and Ondansetron and Treatment may vary due to age or medical condition  Airway Management Planned: Simple Face Mask  Additional Equipment:   Intra-op Plan:   Post-operative Plan:   Informed Consent: I have reviewed the patients History and Physical, chart, labs and discussed the procedure including the risks, benefits and alternatives for the proposed anesthesia with the patient or authorized representative who has indicated his/her understanding and acceptance.     Dental advisory given  Plan Discussed with: CRNA  Anesthesia Plan Comments:         Anesthesia Quick Evaluation

## 2018-12-30 NOTE — Evaluation (Addendum)
Physical Therapy Evaluation Patient Details Name: Tabitha Wilcox MRN: [id number] DOB: 1956-01-06 Today's Date: 12/30/2018   History of Present Illness  63 y.o. female s/p Lt TKA on 12/30/18 with PMH significant for OA, Rt RCR, depression, anxitey, and GERD.  Clinical Impression  Tabitha Wilcox is a 63 y.o. female POD 0 s/p Lt TKA. Patient reports independence with mobility at baseline however has been using a SPC recently due to pain. Patient is now limited by functional impairments (see PT problem list below) and requires min assist for transfers with RW. Patient was limited tonight by poor Lt quad activation and gait was deferred due to safety concern. patient required verbal cues to sequence transfer to chair with RW and manual facilitation of Lt knee extension in stance. Patient instructed in exercise to facilitate ROM and circulation. Patient will benefit from continued skilled PT interventions to address impairments and progress towards PLOF. Acute PT will follow to progress mobility and stair training in preparation for safe discharge home.     Follow Up Recommendations Follow surgeon's recommendation for DC plan and follow-up therapies    Equipment Recommendations  Rolling walker with 5" wheels(short)    Recommendations for Other Services       Precautions / Restrictions Precautions Precautions: Fall Restrictions Weight Bearing Restrictions: No      Mobility  Bed Mobility Overal bed mobility: Needs Assistance Bed Mobility: Supine to Sit     Supine to sit: Min guard;HOB elevated     General bed mobility comments: cues for technique with use of bed rails, no physical assist needed  Transfers Overall transfer level: Needs assistance Equipment used: Rolling walker (2 wheeled) Transfers: Sit to/from [facility] Sit to Stand: Min assist Stand pivot transfers: Min assist       General transfer comment: cues for hand placement and technique with RW, pt  required min assist to stabilize Lt knee in standing and cues for UE support on RW to unweight Lt LE and prevent buckling. pt required verbal cues to sequence safe step pattern with RW and assist at Lt knee to facilitate extension in stance phase  Ambulation/Gait         Stairs         Wheelchair Mobility    Modified Rankin (Stroke Patients Only)       Balance Overall balance assessment: Needs assistance Sitting-balance support: No upper extremity supported;Feet supported Sitting balance-Leahy Scale: Good     Standing balance support: During functional activity;Bilateral upper extremity supported Standing balance-Leahy Scale: Poor              Pertinent Vitals/Pain Pain Assessment: Faces Faces Pain Scale: Hurts a little bit Pain Location: Lt knee Pain Descriptors / Indicators: Sore;Aching Pain Intervention(s): Limited activity within patient's tolerance;Monitored during session;Repositioned    Home Living Family/patient expects to be discharged to:: Private residence Living Arrangements: Children Available Help at Discharge: Family;Available 24 hours/day(daughter works at night and on weekend, and daughters ex can help during the day) Type of Home: House Home Access: Stairs to enter Entrance Stairs-Rails: None Entrance Stairs-Number of Steps: 1 threshold Home Layout: Two level;Bed/bath upstairs;1/2 bath on main level Home Equipment: Shower seat - built in;Walker - 4 wheels;Cane - single point Additional Comments: walk in shower has a seat    Prior Function Level of Independence: Independent with assistive device(s)         Comments: using SPC prior to this     Hand Dominance   Dominant Hand: Right  Extremity/Trunk Assessment   Upper Extremity Assessment Upper Extremity Assessment: Overall WFL for tasks assessed    Lower Extremity Assessment Lower Extremity Assessment: Generalized weakness;LLE deficits/detail LLE Deficits / Details: pt with  slightly limited sensation along Lt foot, pt able to perform good quad contraction in supine, 3-/5 for quad strength with MMT LLE Sensation: decreased light touch LLE Coordination: WNL    Cervical / Trunk Assessment Cervical / Trunk Assessment: Normal  Communication   Communication: No difficulties  Cognition Arousal/Alertness: Awake/alert Behavior During Therapy: WFL for tasks assessed/performed Overall Cognitive Status: Within Functional Limits for tasks assessed             General Comments      Exercises Total Joint Exercises Ankle Circles/Pumps: AROM;Seated;10 reps;Both Quad Sets: AROM;10 reps;Left;Supine   Assessment/Plan    PT Assessment Patient needs continued PT services  PT Problem List Decreased strength;Decreased mobility;Decreased range of motion;Decreased activity tolerance;Decreased balance;Decreased knowledge of use of DME       PT Treatment Interventions DME instruction;Functional mobility training;Gait training;Therapeutic activities;Therapeutic exercise;Stair training;Balance training;Patient/family education;Modalities    PT Goals (Current goals can be found in the Care Plan section)  Acute Rehab PT Goals Patient Stated Goal: to return home and get up to 2nd floor bedroom PT Goal Formulation: With patient Time For Goal Achievement: 01/06/19 Potential to Achieve Goals: Good    Frequency 7X/week    AM-PAC PT "6 Clicks" Mobility  Outcome Measure Help needed turning from your back to your side while in a flat bed without using bedrails?: A Little Help needed moving from lying on your back to sitting on the side of a flat bed without using bedrails?: A Little Help needed moving to and from a bed to a chair (including a wheelchair)?: A Little Help needed standing up from a chair using your arms (e.g., wheelchair or bedside chair)?: A Little Help needed to walk in hospital room?: A Lot Help needed climbing 3-5 steps with a railing? : A Lot 6 Click  Score: 16    End of Session Equipment Utilized During Treatment: Gait belt Activity Tolerance: Patient tolerated treatment well Patient left: in chair;with call bell/phone within reach;with chair alarm set;with family/visitor present Nurse Communication: Mobility status PT Visit Diagnosis: Difficulty in walking, not elsewhere classified (R26.2);Muscle weakness (generalized) (M62.81)    Time: [id number] PT Time Calculation (min) (ACUTE ONLY): 32 min   Charges:   PT Evaluation $PT Eval Low Complexity: 1 Low PT Treatments $Therapeutic Activity: 8-22 mins       Kipp Brood, PT, DPT Physical Therapist with [facility]  12/30/2018 7:22 PM

## 2018-12-30 NOTE — Discharge Instructions (Signed)

## 2018-12-30 NOTE — Interval H&P Note (Signed)
History and Physical Interval Note:  12/30/2018 12:09 PM  Tabitha Wilcox  has presented today for surgery, with the diagnosis of LEFT TOTAL KNEE OSTEOARTHRITIS.  The various methods of treatment have been discussed with the patient and family. After consideration of risks, benefits and other options for treatment, the patient has consented to  Procedure(s): TOTAL KNEE ARTHROPLASTY (Left) as a surgical intervention.  The patient's history has been reviewed, patient examined, no change in status, stable for surgery.  I have reviewed the patient's chart and labs.  Questions were answered to the patient's satisfaction.     Alta Corning

## 2018-12-30 NOTE — Op Note (Signed)
PATIENT ID:      Tabitha Wilcox  MRN:     [id number] DOB/AGE:    March 16, 1955 / 63 y.o.       OPERATIVE REPORT   DATE OF PROCEDURE:  12/30/2018      PREOPERATIVE DIAGNOSIS:   LEFT TOTAL KNEE OSTEOARTHRITIS      Estimated body mass index is 35.85 kg/m as calculated from the following:   Height as of this encounter: 5\' 2"  (1.575 m).   Weight as of this encounter: 88.9 kg.                                                       POSTOPERATIVE DIAGNOSIS:   LEFT TOTAL KNEE OSTEOARTHRITIS                                                                       PROCEDURE:  Procedure(s): TOTAL KNEE ARTHROPLASTY Using DepuyAttune RP implants #4  Femur, #5Tibia, 7 mm Attune RP bearing, 53 Patella    SURGEON: Alta Corning  ASSISTANT:   J9im Bethune PA-C   (Present and scrubbed throughout the case, critical for assistance with exposure, retraction, instrumentation, and closure.)        ANESTHESIA: spinal, 20cc Exparel, 50cc 0.25% Marcaine EBL: min cc FLUID REPLACEMENT: unk cc crystaloid TOURNIQUET: DRAINS: None TRANEXAMIC ACID: 1gm IV, 2gm topical COMPLICATIONS:  None         INDICATIONS FOR PROCEDURE: The patient has  LEFT TOTAL KNEE OSTEOARTHRITIS, varus deformities, XR shows bone on bone arthritis, lateral subluxation of tibia. Patient has failed all conservative measures including anti-inflammatory medicines, narcotics, attempts at exercise and weight loss, cortisone injections and viscosupplementation.  Risks and benefits of surgery have been discussed, questions answered.   DESCRIPTION OF PROCEDURE: The patient identified by armband, received  IV antibiotics, in the holding area at [facility]. Patient taken to the operating room, appropriate anesthetic monitors were attached, and spinal anesthesia was  induced. IV Tranexamic acid was given.Tourniquet applied high to the operative thigh. Lateral post and foot positioner applied to the table, the lower extremity was then prepped and draped in  usual sterile fashion from the toes to the tourniquet. Time-out procedure was performed. The skin and subcutaneous tissue along the incision was injected with 20 cc of a mixture of Exparel and Marcaine solution, using a 20-gauge by 1-1/2 inch needle. We began the operation, with the knee flexed 130 degrees, by making the anterior midline incision starting at handbreadth above the patella going over the patella 1 cm medial to and 4 cm distal to the tibial tubercle. Small bleeders in the skin and the subcutaneous tissue identified and cauterized. Transverse retinaculum was incised and reflected medially and a medial parapatellar arthrotomy was accomplished. the patella was everted and theprepatellar fat pad resected. The superficial medial collateral ligament was then elevated from anterior to posterior along the proximal flare of the tibia and anterior half of the menisci resected. The knee was hyperflexed exposing bone on bone arthritis. Peripheral and notch osteophytes as well as the cruciate ligaments were then  resected. We continued to work our way around posteriorly along the proximal tibia, and externally rotated the tibia subluxing it out from underneath the femur. A McHale PCL retractor was placed through the notch and a lateral Hohmann retractor placed, and we then entered the proximal tibia in line with the Depuy starter drill in line with the axis of the tibia followed by an intramedullary guide rod and 0-degree posterior slope cutting guide. The tibial cutting guide, 4 degree posterior sloped, was pinned into place allowing resection of 2 mm of bone medially and 10 mm of bone laterally. Satisfied with the tibial resection, we then entered the distal femur 2 mm anterior to the PCL origin with the intramedullary guide rod and applied the distal femoral cutting guide set at 9 mm, with 5 degrees of valgus. This was pinned along the epicondylar axis. At this point, the distal femoral cut was accomplished  without difficulty. We then sized for a #4 femoral component and pinned the guide in 3 degrees of external rotation. The chamfer cutting guide was pinned into place. The anterior, posterior, and chamfer cuts were accomplished without difficulty followed by the Attune RP box cutting guide and the box cut. We also removed posterior osteophytes from the posterior femoral condyles. The posterior capsule was injected with Exparel solution. The knee was brought into full extension. We checked our extension gap and fit a 7 mm bearing. Distracting in extension with a lamina spreader,  bleeders in the posterior capsule, Posterior medial and posterior lateral gutter were cauterized.  The transexamic acid-soaked sponge was then placed in the gap of the knee in extension. The knee was flexed 30. The posterior patella cut was accomplished with the 9.5 mm Attune cutting guide, sized for a 46mm dome, and the fixation pegs drilled.The knee was then once again hyperflexed exposing the proximal tibia. We sized for a # 5 tibial base plate, applied the smokestack and the conical reamer followed by the the Delta fin keel punch. We then hammered into place the Attune RP trial femoral component, drilled the lugs, inserted a  7 mm trial bearing, trial patellar button, and took the knee through range of motion from 0-130 degrees. Medial and lateral ligamentous stability was checked. No thumb pressure was required for patellar Tracking. The tourniquet was @45  min. All trial components were removed, mating surfaces irrigated with pulse lavage, and dried with suction and sponges. 10 cc of the Exparel solution was applied to the cancellus bone of the patella distal femur and proximal tibia.  After waiting 30 seconds, the bony surfaces were again, dried with sponges. A double batch of DePuy HV cement was mixed and applied to all bony metallic mating surfaces except for the posterior condyles of the femur itself. In order, we hammered into place  the tibial tray and removed excess cement, the femoral component and removed excess cement. The final Attune RP bearing was inserted, and the knee brought to full extension with compression. The patellar button was clamped into place, and excess cement removed. The knee was held at 30 flexion with compression, while the cement cured. The wound was irrigated out with normal saline solution pulse lavage. The rest of the Exparel was injected into the parapatellar arthrotomy, subcutaneous tissues, and periosteal tissues. The parapatellar arthrotomy was closed with running #1 Vicryl suture. The subcutaneous tissue with 0 and 2-0 undyed Vicryl suture, and the skin with running 3-0 SQ vicryl. An Aquacil and Ace wrap were applied. The patient was taken to  recovery room without difficulty.   Alta Corning 12/30/2018, 2:01 PM

## 2018-12-31 DIAGNOSIS — M1712 Unilateral primary osteoarthritis, left knee: Secondary | ICD-10-CM | POA: Diagnosis not present

## 2018-12-31 LAB — CBC
HCT: 34.2 % — ABNORMAL LOW (ref 36.0–46.0)
Hemoglobin: 10.6 g/dL — ABNORMAL LOW (ref 12.0–15.0)
MCH: 27.9 pg (ref 26.0–34.0)
MCHC: 31 g/dL (ref 30.0–36.0)
MCV: 90 fL (ref 80.0–100.0)
Platelets: 350 10*3/uL (ref 150–400)
RBC: 3.8 MIL/uL — ABNORMAL LOW (ref 3.87–5.11)
RDW: 15.3 % (ref 11.5–15.5)
WBC: 17.8 10*3/uL — ABNORMAL HIGH (ref 4.0–10.5)
nRBC: 0 % (ref 0.0–0.2)

## 2018-12-31 NOTE — Discharge Summary (Signed)
Patient ID: Tabitha Wilcox MRN: [id number] DOB/AGE: 1955/11/03 63 y.o.  Admit date: 12/30/2018 Discharge date: 12/31/2018  Admission Diagnoses:  Principal Problem:   Primary osteoarthritis of left knee   Discharge Diagnoses:  Same  Past Medical History:  Diagnosis Date  . Allergy   . Anxiety   . Arthritis   . Asthma    and bronchitisl reprots her sob is due to her asthma   . Carpal tunnel syndrome on both sides   . Depression   . GERD (gastroesophageal reflux disease)   . Rotator cuff injury    Right side  . Sciatica   . Shortness of breath    sob with exertion   . Tobacco abuse   . Ulcer    1990's    Surgeries: Procedure(s): TOTAL KNEE ARTHROPLASTY on 12/30/2018   Consultants:   Discharged Condition: Improved  Hospital Course: Tabitha Wilcox is an 63 y.o. female who was admitted 12/30/2018 for operative treatment ofPrimary osteoarthritis of left knee. Patient has severe unremitting pain that affects sleep, daily activities, and work/hobbies. After pre-op clearance the patient was taken to the operating room on 12/30/2018 and underwent  Procedure(s): TOTAL KNEE ARTHROPLASTY.    Patient was given perioperative antibiotics:  Anti-infectives (From admission, onward)   Start     Dose/Rate Route Frequency Ordered Stop   12/30/18 2200  vancomycin (VANCOCIN) IVPB 1000 mg/200 mL premix     1,000 mg 200 mL/hr over 60 Minutes Intravenous Every 12 hours 12/30/18 1630 12/31/18 2159   12/30/18 0945  vancomycin (VANCOCIN) IVPB 1000 mg/200 mL premix     1,000 mg 200 mL/hr over 60 Minutes Intravenous On call to O.R. 12/30/18 0940 12/30/18 1145   12/30/18 0945  ceFAZolin (ANCEF) IVPB 2g/100 mL premix     2 g 200 mL/hr over 30 Minutes Intravenous On call to O.R. 12/30/18 0940 12/30/18 1317       Patient was given sequential compression devices, early ambulation, and chemoprophylaxis to prevent DVT.  Patient benefited maximally from hospital stay and there were no  complications.    Recent vital signs:  Patient Vitals for the past 24 hrs:  BP Temp Temp src Pulse Resp SpO2 Height Weight  12/31/18 0600 123/75 97.9 F (36.6 C) Oral 98 16 100 % - -  12/30/18 2358 (!) 142/82 97.8 F (36.6 C) Oral (!) 102 18 96 % - -  12/30/18 2213 - - - - - 98 % - -  12/30/18 2016 135/74 97.7 F (36.5 C) Oral (!) 103 18 100 % - -  12/30/18 1910 131/78 97.9 F (36.6 C) Oral (!) 106 18 99 % - -  12/30/18 1801 (!) 141/77 98.2 F (36.8 C) Oral (!) 109 18 100 % - -  12/30/18 1620 135/86 (!) 97.5 F (36.4 C) Oral 71 14 98 % - -  12/30/18 1545 (!) 120/94 - - 87 20 100 % - -  12/30/18 1530 118/75 - - 86 13 97 % - -  12/30/18 1515 116/69 - - 84 17 97 % - -  12/30/18 1500 (!) 142/69 - - 86 13 100 % - -  12/30/18 1445 112/76 - - 91 17 99 % - -  12/30/18 1438 (!) 96/49 97.8 F (36.6 C) - 91 (!) 30 100 % - -  12/30/18 1157 - - - (!) 102 - 98 % - -  12/30/18 1156 - - - (!) 104 - 98 % - -  12/30/18 1155 119/67 - - [name])  103 - 98 % - -  12/30/18 1154 - - - (!) 106 - 98 % - -  12/30/18 1153 - - - (!) 104 - 99 % - -  12/30/18 1152 - - - (!) 101 - 100 % - -  12/30/18 1151 - - - (!) 101 - 100 % - -  12/30/18 1150 125/70 - - (!) 102 - 100 % - -  12/30/18 1149 - - - 100 - 100 % - -  12/30/18 1148 - - - 95 - 100 % - -  12/30/18 1147 - - - 94 - 100 % - -  12/30/18 1146 - - - 95 - 100 % - -  12/30/18 1145 127/79 - - (!) 101 - 100 % - -  12/30/18 1144 - - - 98 - 100 % - -  12/30/18 1143 - - - 94 - 100 % - -  12/30/18 1142 - - - 85 - 100 % - -  12/30/18 1141 - - - 89 - 100 % - -  12/30/18 1140 131/82 - - 83 - 100 % - -  12/30/18 0948 (!) 141/85 98.6 F (37 C) Oral (!) 102 15 100 % - -  12/30/18 0941 - - - - - - 5\' 2"  (1.575 m) 88.9 kg     Recent laboratory studies:  Recent Labs    12/31/18 0322  WBC 17.8*  HGB 10.6*  HCT 34.2*  PLT 350     Discharge Medications:   Allergies as of 12/31/2018   No Known Allergies     Medication List    STOP taking these  medications   azithromycin 250 MG tablet Commonly known as: ZITHROMAX   traMADol 50 MG tablet Commonly known as: ULTRAM   Voltaren 1 % Gel Generic drug: diclofenac sodium     TAKE these medications   albuterol (2.5 MG/3ML) 0.083% nebulizer solution Commonly known as: PROVENTIL Take 3 mLs (2.5 mg total) by nebulization every 6 (six) hours as needed for wheezing or shortness of breath.   albuterol 108 (90 Base) MCG/ACT inhaler Commonly known as: ProAir HFA Inhale 1-2 puffs into the lungs every 6 (six) hours as needed for wheezing or shortness of breath.   aspirin EC 325 MG tablet Take 1 tablet (325 mg total) by mouth 2 (two) times daily after a meal. Take x 1 month post op to decrease risk of blood clots.   CALCIUM 600+D PO Take 1 tablet by mouth daily.   celecoxib 200 MG capsule Commonly known as: CELEBREX Take 200 mg by mouth 2 (two) times daily.   cyclobenzaprine 10 MG tablet Commonly known as: FLEXERIL Take 10 mg by mouth 2 (two) times daily.   docusate sodium 100 MG capsule Commonly known as: Colace Take 1 capsule (100 mg total) by mouth 2 (two) times daily.   fluticasone 44 MCG/ACT inhaler Commonly known as: FLOVENT HFA Inhale 1 puff into the lungs 2 (two) times daily. What changed:   when to take this  reasons to take this   gabapentin 300 MG capsule Commonly known as: NEURONTIN Take 1 capsule (300 mg total) by mouth 3 (three) times daily.   Krill Oil 350 MG Caps Take 350 mg by mouth daily.   multivitamin with minerals Tabs tablet Take 1 tablet by mouth daily. Centrum   omeprazole 20 MG capsule Commonly known as: PRILOSEC Take 1 capsule (20 mg total) by mouth daily before lunch.   oxyCODONE-acetaminophen 5-325 MG tablet Commonly known  as: PERCOCET/ROXICET Take 1-2 tablets by mouth every 6 (six) hours as needed for severe pain.   QUEtiapine 50 MG tablet Commonly known as: SEROQUEL Take 25 mg by mouth at bedtime as needed (sleep).        Diagnostic Studies: Dg Chest 2 View  Result Date: 12/23/2018 CLINICAL DATA:  Preop for total knee replacement. Current smoker. History of asthma. EXAM: CHEST - 2 VIEW COMPARISON:  12/30/2008. FINDINGS: Cardiac silhouette is normal in size. No mediastinal or hilar masses. No evidence of adenopathy. Lungs are clear.  No pleural effusion or pneumothorax. Skeletal structures are intact. IMPRESSION: No active cardiopulmonary disease. Electronically Signed   By: Lajean Manes M.D.   On: 12/23/2018 15:28    Disposition: Discharge disposition: 01-Home or Self Care       Discharge Instructions    Call MD / Call 911   Complete by: As directed    If you experience chest pain or shortness of breath, CALL 911 and be transported to the hospital emergency room.  If you develope a fever above 101 F, pus (white drainage) or increased drainage or redness at the wound, or calf pain, call your surgeon's office.   Constipation Prevention   Complete by: As directed    Drink plenty of fluids.  Prune juice may be helpful.  You may use a stool softener, such as Colace (over the counter) 100 mg twice a day.  Use MiraLax (over the counter) for constipation as needed.   Diet - low sodium heart healthy   Complete by: As directed    Increase activity slowly as tolerated   Complete by: As directed       Follow-up Information    Dorna Leitz, MD. Schedule an appointment as soon as possible for a visit in 2 weeks.   Specialty: Orthopedic Surgery Contact information: [place] [address] [phone]            Signed: Erle Crocker 12/31/2018, 8:45 AM

## 2018-12-31 NOTE — Progress Notes (Signed)
     Tabitha Wilcox is a 63 y.o. female   Orthopaedic diagnosis: Status post left total knee arthroplasty  Subjective: Patient is resting comfortably today.  She states the block is wearing off but is doing well.  She worked with physical therapy yesterday.  She is awaiting therapy for this morning.  Is anticipating discharge today.  Denies shortness of breath.  No new complaints.  Objectyive: Vitals:   12/30/18 2358 12/31/18 0600  BP: (!) 142/82 123/75  Pulse: (!) 102 98  Resp: 18 16  Temp: 97.8 F (36.6 C) 97.9 F (36.6 C)  SpO2: 96% 100%     Exam: Awake and alert Respirations even and unlabored No acute distress  Left knee with dressing in place.  Ace wrap for compression.  No tenderness to palpation about the leg or ankle.  Endorses sensation light touch distally about the foot.  Is able to actively dorsiflex plantarflex the ankle.  Assessment: Postop day 1 status post left total knee arthroplasty, doing well   Plan: She will work with physical therapy this morning and likely be discharged home today.  Medications have been sent to the pharmacy.  She may adjust the Ace wrap if it feels too tight but will leave the under lying dressing in place until follow-up.   Radene Journey, MD

## 2018-12-31 NOTE — Progress Notes (Signed)

## 2018-12-31 NOTE — TOC Progression Note (Signed)
Transition of Care [facility]) - Progression Note    Patient Details  Name: JILLIANNA JAKUBCZAK MRN: [id number] Date of Birth: 23-Jun-1955  Transition of Care [facility]) CM/SW Contact  Joaquin Courts, RN Phone Number: 12/31/2018, 10:31 AM  Clinical Narrative: CM spoke with patient at bedside. Patient set up with [facility] for [place]. [employer] to deliver rolling walker and 3-in-1 to bedside for home use.       Expected Discharge Plan: [place] Barriers to Discharge: No Barriers Identified  Expected Discharge Plan and Services Expected Discharge Plan: [place]   Discharge Planning Services: CM Consult Post Acute Care Choice: [place] arrangements for the past 2 months: Single Family Home Expected Discharge Date: 12/31/18               DME Arranged: Berta Minor rolling DME Agency: [employer] Date DME Agency Contacted: 12/31/18 Time DME Agency Contacted: 68 Representative spoke with at DME Agency: [place]: PT [place]: [facility] (formerly [employer])     Representative spoke with at [place]: pre arranged in MD office   Social Determinants of Health ([place]) Interventions    Readmission Risk Interventions No flowsheet data found.

## 2018-12-31 NOTE — Progress Notes (Signed)
Physical Therapy Treatment Patient Details Name: Tabitha Wilcox MRN: [id number] DOB: 23-May-1955 Today's Date: 12/31/2018    History of Present Illness 63 y.o. female s/p Lt TKA on 12/30/18 with PMH significant for OA, Rt RCR, depression, anxitey, and GERD.    PT Comments    Pt moving well.  She was able to ambulate 150' with RW with step through pattern. She plans on sleeping downstairs for awhile before doing stairs at home to get to her bedroom.  From a mobility point she is ok for d/c.   Follow Up Recommendations  Follow surgeon's recommendation for DC plan and follow-up therapies     Equipment Recommendations  Rolling walker with 5" wheels    Recommendations for Other Services       Precautions / Restrictions Precautions Precautions: Fall Restrictions Weight Bearing Restrictions: No    Mobility  Bed Mobility Overal bed mobility: Needs Assistance Bed Mobility: Supine to Sit     Supine to sit: Supervision     General bed mobility comments: use of bed rail (she has one at home) with flat bed  Transfers Overall transfer level: Needs assistance Equipment used: Rolling walker (2 wheeled) Transfers: Sit to/from Stand Sit to Stand: Supervision;Min guard         General transfer comment: min/guard initally and progressed to S by end of session.  Ambulation/Gait Ambulation/Gait assistance: Min guard Gait Distance (Feet): 150 Feet Assistive device: Rolling walker (2 wheeled) Gait Pattern/deviations: Step-through pattern;Decreased step length - right;Decreased step length - left Gait velocity: decreased   General Gait Details: Occasionally pt would demonstrate increased weight shift to the R with a hip dip on the R. She denies feeling of buckleing just that when her L leg gets tired she shifts more to the R.   Stairs Stairs: (verbally reviewed threshold entrance)           Wheelchair Mobility    Modified Rankin (Stroke Patients Only)       Balance  Overall balance assessment: Needs assistance Sitting-balance support: No upper extremity supported;Feet supported Sitting balance-Leahy Scale: Good     Standing balance support: During functional activity;Bilateral upper extremity supported Standing balance-Leahy Scale: Poor                              Cognition Arousal/Alertness: Awake/alert Behavior During Therapy: WFL for tasks assessed/performed Overall Cognitive Status: Within Functional Limits for tasks assessed                                        Exercises Total Joint Exercises Ankle Circles/Pumps: AROM;10 reps;Both;Supine Quad Sets: Strengthening;10 reps;Supine;Left Towel Squeeze: 5 reps;Supine Short Arc Quad: Strengthening;Left;5 reps;Supine Heel Slides: AROM;Left;10 reps;Supine Hip ABduction/ADduction: AROM;Left;10 reps;Supine Straight Leg Raises: Strengthening;Left;5 reps;Supine Goniometric ROM: ~ -3 to 95 degrees    General Comments        Pertinent Vitals/Pain Pain Assessment: 0-10 Pain Score: 0-No pain Pain Intervention(s): Premedicated before session    Home Living                      Prior Function            PT Goals (current goals can now be found in the care plan section) Acute Rehab PT Goals Patient Stated Goal: to return home and get up to 2nd floor bedroom PT Goal Formulation:  With patient Time For Goal Achievement: 01/06/19 Potential to Achieve Goals: Good Progress towards PT goals: Progressing toward goals    Frequency    7X/week      PT Plan Current plan remains appropriate    Co-evaluation              AM-PAC PT "6 Clicks" Mobility   Outcome Measure  Help needed turning from your back to your side while in a flat bed without using bedrails?: A Little Help needed moving from lying on your back to sitting on the side of a flat bed without using bedrails?: A Little Help needed moving to and from a bed to a chair (including a  wheelchair)?: A Little Help needed standing up from a chair using your arms (e.g., wheelchair or bedside chair)?: A Little Help needed to walk in hospital room?: A Little Help needed climbing 3-5 steps with a railing? : A Little 6 Click Score: 18    End of Session Equipment Utilized During Treatment: Gait belt Activity Tolerance: Patient tolerated treatment well Patient left: in chair;with call bell/phone within reach;with chair alarm set Nurse Communication: Mobility status PT Visit Diagnosis: Difficulty in walking, not elsewhere classified (R26.2);Muscle weakness (generalized) (M62.81)     Time: [id number] PT Time Calculation (min) (ACUTE ONLY): 37 min  Charges:  $Gait Training: 8-22 mins $Therapeutic Exercise: 8-22 mins                     Tabitha Wilcox, [place] Pager [phone] 12/31/2018    Tabitha Wilcox 12/31/2018, 11:48 AM

## 2019-01-02 ENCOUNTER — Encounter (HOSPITAL_COMMUNITY): Payer: Self-pay | Admitting: Orthopedic Surgery

## 2019-05-26 ENCOUNTER — Ambulatory Visit (INDEPENDENT_AMBULATORY_CARE_PROVIDER_SITE_OTHER): Payer: Medicare Other | Admitting: Family Medicine

## 2019-05-26 ENCOUNTER — Other Ambulatory Visit: Payer: Self-pay

## 2019-05-26 DIAGNOSIS — M722 Plantar fascial fibromatosis: Secondary | ICD-10-CM | POA: Diagnosis not present

## 2019-05-26 NOTE — Progress Notes (Signed)
[place] Attending Note: I have seen and examined this patient in conjunction with the medical student. I have discussed this patient with the medical student and reviewed the assessment and plan as documented above. I agree with the student's  findings and plan. Patient well-known to me with successful recent TKR on the left. She has great healing with full range of motion flexion and extension. Her scar is well-healed. I suspect she started having trouble with the contralateral foot after she had the surgery and was bearing more weight on that side. She has a normal gait now. She does have some tenderness to palpation at the origin of the plantar fascia. We will start with a scaphoid pad placed on green insoles for support. We will do some stretching exercises with roller and icing as per patient instructions. I will see her back in 3 to 4 weeks. She has any new or worsening symptoms in the interim, she will call us. She was having some back pain when she originally made the appointment but that has pretty much totally resolved.

## 2019-05-26 NOTE — Progress Notes (Signed)
PCP: Nolene Ebbs, MD  Subjective:   HPI: Patient is a 64 y.o. adult with recent hx of total left knee arthroplasty here for right heel pain.  Pain is located on the on the plantar surface of the calcaneous. Pain was first noticed about 6 weeks ago and is most noticeable when walking specifically when she tries to push-off with her right foot. Patient does note that she has been in PT exercises for many weeks following her left knee replacement and thinks she may have been using her left side more. She has not attempted to treat the pain however it she has noticed it with increasing frequency over most recent weeks. Patient denies pain radiating down the legs or pain radiating throughout the foot.   Past Medical History:  Diagnosis Date  . Allergy   . Anxiety   . Arthritis   . Asthma    and bronchitisl reprots her sob is due to her asthma   . Carpal tunnel syndrome on both sides   . Depression   . GERD (gastroesophageal reflux disease)   . Rotator cuff injury    Right side  . Sciatica   . Shortness of breath    sob with exertion   . Tobacco abuse   . Ulcer    1990's    Current Outpatient Medications on File Prior to Visit  Medication Sig Dispense Refill  . albuterol (PROAIR HFA) 108 (90 Base) MCG/ACT inhaler Inhale 1-2 puffs into the lungs every 6 (six) hours as needed for wheezing or shortness of breath. 18 g 3  . albuterol (PROVENTIL) (2.5 MG/3ML) 0.083% nebulizer solution Take 3 mLs (2.5 mg total) by nebulization every 6 (six) hours as needed for wheezing or shortness of breath. 75 mL 12  . aspirin EC 325 MG tablet Take 1 tablet (325 mg total) by mouth 2 (two) times daily after a meal. Take x 1 month post op to decrease risk of blood clots. 60 tablet 0  . Calcium Carbonate-Vitamin D (CALCIUM 600+D PO) Take 1 tablet by mouth daily.    . celecoxib (CELEBREX) 200 MG capsule Take 200 mg by mouth 2 (two) times daily.  5  . cyclobenzaprine (FLEXERIL) 10 MG tablet Take 10 mg by  mouth 2 (two) times daily.   10  . docusate sodium (COLACE) 100 MG capsule Take 1 capsule (100 mg total) by mouth 2 (two) times daily. 30 capsule 0  . fluticasone (FLOVENT HFA) 44 MCG/ACT inhaler Inhale 1 puff into the lungs 2 (two) times daily. (Patient taking differently: Inhale 1 puff into the lungs 2 (two) times daily as needed (shortness of breath). ) 1 Inhaler 2  . gabapentin (NEURONTIN) 300 MG capsule Take 1 capsule (300 mg total) by mouth 3 (three) times daily. 90 capsule 2  . Krill Oil 350 MG CAPS Take 350 mg by mouth daily.     . Multiple Vitamin (MULTIVITAMIN WITH MINERALS) TABS tablet Take 1 tablet by mouth daily. Centrum    . omeprazole (PRILOSEC) 20 MG capsule Take 1 capsule (20 mg total) by mouth daily before lunch. 90 capsule 0  . oxyCODONE-acetaminophen (PERCOCET/ROXICET) 5-325 MG tablet Take 1-2 tablets by mouth every 6 (six) hours as needed for severe pain. 40 tablet 0  . QUEtiapine (SEROQUEL) 50 MG tablet Take 25 mg by mouth at bedtime as needed (sleep).   5   No current facility-administered medications on file prior to visit.    Past Surgical History:  Procedure Laterality Date  .  APPENDECTOMY    . APPLICATION OF ROBOTIC ASSISTANCE FOR SPINAL PROCEDURE N/A 11/02/2016   Procedure: APPLICATION OF ROBOTIC ASSISTANCE FOR SPINAL PROCEDURE;  Surgeon: Ditty, Kevan Ny, MD;  Location: [place];  Service: Neurosurgery;  Laterality: N/A;  . EXPLORATORY LAPAROTOMY     reports they puntured something when they were taking the appendix , and had to go back and put in a drain    . IRRIGATION AND DEBRIDEMENT ABSCESS Right 07/05/2012   Procedure: IRRIGATION AND DEBRIDEMENT RIGHT LABIAL/THIGH ABSCESS;  Surgeon: Harl Bowie, MD;  Location: [place];  Service: General;  Laterality: Right;  . SHOULDER ARTHROSCOPY W/ ROTATOR CUFF REPAIR Right 2015  . TOTAL KNEE ARTHROPLASTY Left 12/30/2018   Procedure: TOTAL KNEE ARTHROPLASTY;  Surgeon: Dorna Leitz, MD;  Location: [facility];  Service:  Orthopedics;  Laterality: Left;  . UPPER GASTROINTESTINAL ENDOSCOPY      No Known Allergies  Social History   Socioeconomic History  . Marital status: Divorced    Spouse name: Not on file  . Number of children: 3  . Years of education: Not on file  . Highest education level: Not on file  Occupational History  . Occupation: Unemployed   Tobacco Use  . Smoking status: Current Every Day Smoker    Packs/day: 0.25    Years: 25.00    Pack years: 6.25    Types: Cigarettes  . Smokeless tobacco: Never Used  . Tobacco comment: Currently smoking less than 1/4  pack per day depending on how stressed she is.. 12-23-2018 confirmd the previous statement   Substance and Sexual Activity  . Alcohol use: Yes    Comment: Martini sometimes.(1 a day) wine cooler  . Drug use: No    Comment: stopped since 2 1/2 years. Before using Marijuana.   . Sexual activity: Not on file  Other Topics Concern  . Not on file  Social History Narrative   Occupation: Worked in Designer, industrial/product.  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       Daughter, 57   Son, 67   Son, 57 (Deceased, shot at fathers home in 2007)   Does not exercise anymore due to foot/sciatica pain      Social Determinants of Radio broadcast assistant Strain:   . Difficulty of Paying Living Expenses:   Food Insecurity:   . Worried About Charity fundraiser in the Last Year:   . Arboriculturist in the Last Year:   Transportation Needs:   . Film/video editor (Medical):   [name] Lack of Transportation (Non-Medical):   Physical Activity:   . Days of Exercise per Week:   . Minutes of Exercise per Session:   Stress:   . Feeling of Stress :   Social Connections:   . Frequency of Communication with Friends and Family:   . Frequency of Social Gatherings with Friends and Family:   . Attends Religious Services:   . Active Member of Clubs  or Organizations:   . Attends Archivist Meetings:   [name] Marital Status:   Intimate Partner Violence:   . Fear of Current or Ex-Partner:   . Emotionally Abused:   [name] Physically Abused:   . Sexually Abused:     Family History  Problem Relation Age of Onset  . Colon cancer Maternal Aunt   . Colon cancer Paternal Uncle   . Esophageal cancer  Neg Hx   . Rectal cancer Neg Hx   . Stomach cancer Neg Hx     BP 126/72   Ht 5\' 2"  (1.575 m)   Wt 197 lb (89.4 kg)   BMI 36.03 kg/m   Review of Systems: See HPI above.     Objective:  Physical Exam:  General: Alert and oriented x3, NAD, comfortable in exam room HEENT: normocephalic and atraumatic, EOMI, conjunctivae and sclerae clear, MMM, no erythema or exudates, trachea midline,  Right Foot  Inspection: noticeable pes planus on standing, no swelling, no scarring, no overlying bruising or breaks in the skin, no obvious bony abnormalities Palpation: TTP on the plantar surface of the calcaneous near the insertion of plantarfascia ROM (passive): complete ROM of flexion/extension/inversion/eversion ROM (active): complete ROM to flexion/extension/inversion/eversion Strength: 5/5 to plantarflexion and dorsiflexion Special Tests: pain with right single leg calf raise located at the insertion of plantar fascia on the calcaneus, negative talar tilt, negative calcaneal squeeze test, negative Tinel sign     Assessment & Plan:   PFEIFFER BAHAR is a 64 y.o. adult with hx of total left knee arthroplasty on 12/30/18 presenting with right heel pain for 6 weeks felt most noticeably on the push-off phase of walking. Exam with pes planus, reproducible pain on palpation of plantar insertion of calcaneus, and history of surgery on left leg and subsequent compensation consistent plantar fasciits.   1. Right Plantar fasciits -- Shoe insert with arch  -- Home foot rolling exercise  -- f/u in 3-4 weeks    Carolyne Littles, MS4

## 2019-07-07 ENCOUNTER — Other Ambulatory Visit: Payer: Self-pay

## 2019-07-07 ENCOUNTER — Ambulatory Visit (INDEPENDENT_AMBULATORY_CARE_PROVIDER_SITE_OTHER): Payer: Medicare Other | Admitting: Family Medicine

## 2019-07-07 VITALS — BP 124/84 | Ht 62.0 in | Wt 187.0 lb

## 2019-07-07 DIAGNOSIS — M722 Plantar fascial fibromatosis: Secondary | ICD-10-CM

## 2019-07-07 MED ORDER — DICLOFENAC SODIUM 1 % EX GEL: 2.0000 g | Freq: Four times a day (QID) | CUTANEOUS | 1 refills | Status: AC

## 2019-07-07 NOTE — Patient Instructions (Signed)
It was a pleasure to see you today!  For your foot pain:  Increase rolling foot on ice to 3 x a day We will prescribe voltaren gel. If this is not covered by your insurance, you can purchase it over the counter for ~ $12 or you can buy aspercreme

## 2019-07-07 NOTE — Progress Notes (Signed)
  Tabitha Wilcox - 64 y.o. adult MRN [id number]  Date of birth: 03/03/1956  SUBJECTIVE:   CC: right heel pain follow up  64 year old female presenting for follow-up of right plantar fasciitis. She developed right foot pain after a left total knee replacement last year. She was last seen on 3/19 and was provided a scaphoid pad as well as green insoles and instruction to do stretching exercises and icing.  She reports that she has some improvement.  She still has some pain that is worse in the afternoon after being on her feet all day but she reports that it improves with rolling her foot on ice and resting.  She is doing stretching with a band. She has le ss pain when she is wearing her Birkenstock flip-flops compared to her running shoes.  Right now she is able to walk 2 blocks of time before needing to rest.  She would like to increase her time at the gym as she would like to lose weight.   She had plantar fascitis ~ 10 years ago requiring corticosteroid injection. She is wondering if this is something she will need.  ROS: No unexpected weight loss, fever, chills, swelling, instability, muscle pain, numbness/tingling, redness, otherwise see HPI   PMHx - Updated and reviewed.  Contributory factors include: Negative PSHx - Updated and reviewed.  Contributory factors include:  Negative FHx - Updated and reviewed.  Contributory factors include:  Negative Social Hx - Updated and reviewed. Contributory factors include: Negative Medications - reviewed   DATA REVIEWED: Prior records  PHYSICAL EXAM:  VS: BP:124/84  HR: bpm  TEMP: ( )  RESP:   HT:5\' 2"  (157.5 cm)   WT:187 lb (84.8 kg)  BMI:34.19 PHYSICAL EXAM: Gen: NAD, alert, cooperative with exam, well-appearing HEENT: clear conjunctiva,  CV:  no edema, capillary refill brisk, normal rate Resp: non-labored Skin: no rashes, normal turgor  Neuro: no gross deficits.  Psych:  alert and oriented   Right foot: Inspection:  No obvious bony  deformity.  No swelling, erythema, or bruising.  Normal arch that flattens with weight bearing. Palpation: Mild TTP at calcaneus at insertion of plantar fascia ROM: Full  ROM of the ankle. Normal midfoot flexibility Strength: 5/5 strength ankle in all planes Neurovascular: N/V intact distally in the lower extremity   ASSESSMENT & PLAN:  Plantar fascitis, slightly improving with foot support (scaphoid pads in addition to Birkenstock sandals) as well as stretching and rolling foot on ice. Recommend increase amount of rolling from once daily to three times daily. Could also try voltaren gel to rub over painful area on heel. Also suggested pre-medicating with 400-600 mg ibuprofen prior to exercise to see if she can go longer without pain. Given that pain is mild not requiring medication, I do not believe that she needs steroid injection at this time. Return if pain is not improving.

## 2019-07-07 NOTE — Progress Notes (Signed)
[place] Attending Note: I have seen and examined this patient. I have discussed this patient with the resident and reviewed the assessment and plan as documented above. I agree with the resident's findings and plan. Her pain is significantly improved. Essentially not taking any meds for it. Ice/rolling q day and I will have her increase that to bid/tid. Continue with new footwear (Birkenstock foot bed sandals working well for her). I would not inject unless significant worsening of symptoms. F/u PRN

## 2019-10-31 ENCOUNTER — Other Ambulatory Visit: Payer: Self-pay | Admitting: Internal Medicine

## 2019-10-31 DIAGNOSIS — Z1231 Encounter for screening mammogram for malignant neoplasm of breast: Secondary | ICD-10-CM

## 2019-11-24 ENCOUNTER — Ambulatory Visit
Admission: RE | Admit: 2019-11-24 | Discharge: 2019-11-24 | Disposition: A | Discharge status: Home / Self Care | Payer: Medicare Other | Source: Ambulatory Visit | Attending: Internal Medicine | Admitting: Internal Medicine

## 2019-11-24 ENCOUNTER — Other Ambulatory Visit: Payer: Self-pay

## 2019-11-24 DIAGNOSIS — Z1231 Encounter for screening mammogram for malignant neoplasm of breast: Secondary | ICD-10-CM

## 2020-08-20 ENCOUNTER — Other Ambulatory Visit: Payer: Self-pay | Admitting: Internal Medicine

## 2020-08-20 DIAGNOSIS — E2839 Other primary ovarian failure: Secondary | ICD-10-CM

## 2020-08-21 ENCOUNTER — Other Ambulatory Visit: Payer: Self-pay | Admitting: Internal Medicine

## 2020-08-21 DIAGNOSIS — Z1231 Encounter for screening mammogram for malignant neoplasm of breast: Secondary | ICD-10-CM

## 2021-02-07 ENCOUNTER — Ambulatory Visit
Admission: RE | Admit: 2021-02-07 | Discharge: 2021-02-07 | Disposition: A | Discharge status: Home / Self Care | Payer: Medicare Other | Source: Ambulatory Visit | Attending: Internal Medicine | Admitting: Internal Medicine

## 2021-02-07 DIAGNOSIS — Z1231 Encounter for screening mammogram for malignant neoplasm of breast: Secondary | ICD-10-CM

## 2021-02-07 DIAGNOSIS — E2839 Other primary ovarian failure: Secondary | ICD-10-CM

## 2021-05-08 ENCOUNTER — Ambulatory Visit (HOSPITAL_COMMUNITY)
Admission: RE | Admit: 2021-05-08 | Discharge: 2021-05-08 | Disposition: A | Discharge status: Home / Self Care | Payer: Medicare Other | Source: Ambulatory Visit | Attending: Internal Medicine | Admitting: Internal Medicine

## 2021-05-08 ENCOUNTER — Other Ambulatory Visit: Payer: Self-pay

## 2021-05-08 ENCOUNTER — Other Ambulatory Visit (HOSPITAL_COMMUNITY): Payer: Self-pay | Admitting: Internal Medicine

## 2021-05-08 DIAGNOSIS — I739 Peripheral vascular disease, unspecified: Secondary | ICD-10-CM

## 2021-07-01 ENCOUNTER — Other Ambulatory Visit: Payer: Self-pay | Admitting: Internal Medicine

## 2021-07-02 LAB — COMPLETE METABOLIC PANEL WITH GFR
AG Ratio: 1.4 (calc) (ref 1.0–2.5)
ALT: 25 U/L (ref 6–29)
AST: 20 U/L (ref 10–35)
Albumin: 4.8 g/dL (ref 3.6–5.1)
Alkaline phosphatase (APISO): 110 U/L (ref 37–153)
BUN: 14 mg/dL (ref 7–25)
CO2: 26 mmol/L (ref 20–32)
Calcium: 10.5 mg/dL — ABNORMAL HIGH (ref 8.6–10.4)
Chloride: 97 mmol/L — ABNORMAL LOW (ref 98–110)
Creat: 0.8 mg/dL (ref 0.50–1.05)
Globulin: 3.5 g/dL (calc) (ref 1.9–3.7)
Glucose, Bld: 70 mg/dL (ref 65–99)
Potassium: 4.7 mmol/L (ref 3.5–5.3)
Sodium: 136 mmol/L (ref 135–146)
Total Bilirubin: 0.5 mg/dL (ref 0.2–1.2)
Total Protein: 8.3 g/dL — ABNORMAL HIGH (ref 6.1–8.1)
eGFR: 82 mL/min/{1.73_m2} (ref >=60)

## 2021-07-02 LAB — CBC
HCT: 38.6 % (ref 35.0–45.0)
Hemoglobin: 12.4 g/dL (ref 11.7–15.5)
MCH: 28.4 pg (ref 27.0–33.0)
MCHC: 32.1 g/dL (ref 32.0–36.0)
MCV: 88.5 fL (ref 80.0–100.0)
MPV: 10.5 fL (ref 7.5–12.5)
Platelets: 380 10*3/uL (ref 140–400)
RBC: 4.36 10*6/uL (ref 3.80–5.10)
RDW: 13.2 % (ref 11.0–15.0)
WBC: 10.2 10*3/uL (ref 3.8–10.8)

## 2021-07-02 LAB — LIPID PANEL
Cholesterol: 257 mg/dL — ABNORMAL HIGH (ref <200)
HDL: 82 mg/dL (ref >=50)
LDL Cholesterol (Calc): 140 mg/dL (calc) — ABNORMAL HIGH
Non-HDL Cholesterol (Calc): 175 mg/dL (calc) — ABNORMAL HIGH (ref <130)
Total CHOL/HDL Ratio: 3.1 (calc) (ref <5.0)
Triglycerides: 212 mg/dL — ABNORMAL HIGH (ref <150)

## 2021-07-02 LAB — TSH: TSH: 1.39 mIU/L (ref 0.40–4.50)

## 2021-07-02 LAB — VITAMIN D 25 HYDROXY (VIT D DEFICIENCY, FRACTURES): Vit D, 25-Hydroxy: 54 ng/mL (ref 30–100)

## 2022-01-06 ENCOUNTER — Other Ambulatory Visit: Payer: Self-pay | Admitting: Internal Medicine

## 2022-01-06 DIAGNOSIS — Z1231 Encounter for screening mammogram for malignant neoplasm of breast: Secondary | ICD-10-CM

## 2022-03-06 ENCOUNTER — Ambulatory Visit
Admission: RE | Admit: 2022-03-06 | Discharge: 2022-03-06 | Disposition: A | Discharge status: Home / Self Care | Payer: Medicare Other | Source: Ambulatory Visit | Attending: Internal Medicine | Admitting: Internal Medicine

## 2022-03-06 DIAGNOSIS — Z1231 Encounter for screening mammogram for malignant neoplasm of breast: Secondary | ICD-10-CM

## 2022-06-18 ENCOUNTER — Other Ambulatory Visit: Payer: Self-pay | Admitting: Internal Medicine

## 2022-06-19 LAB — COMPLETE METABOLIC PANEL WITH GFR
AG Ratio: 1.4 (calc) (ref 1.0–2.5)
ALT: 19 U/L (ref 6–29)
AST: 21 U/L (ref 10–35)
Albumin: 4.3 g/dL (ref 3.6–5.1)
Alkaline phosphatase (APISO): 156 U/L — ABNORMAL HIGH (ref 37–153)
BUN: 13 mg/dL (ref 7–25)
CO2: 22 mmol/L (ref 20–32)
Calcium: 10.5 mg/dL — ABNORMAL HIGH (ref 8.6–10.4)
Chloride: 101 mmol/L (ref 98–110)
Creat: 0.85 mg/dL (ref 0.50–1.05)
Globulin: 3.1 g/dL (calc) (ref 1.9–3.7)
Glucose, Bld: 95 mg/dL (ref 65–99)
Potassium: 6.1 mmol/L — ABNORMAL HIGH (ref 3.5–5.3)
Sodium: 136 mmol/L (ref 135–146)
Total Bilirubin: 0.3 mg/dL (ref 0.2–1.2)
Total Protein: 7.4 g/dL (ref 6.1–8.1)
eGFR: 76 mL/min/{1.73_m2} (ref >=60)

## 2022-06-19 LAB — CBC
HCT: 29.9 % — ABNORMAL LOW (ref 35.0–45.0)
Hemoglobin: 9.5 g/dL — ABNORMAL LOW (ref 11.7–15.5)
MCH: 28.3 pg (ref 27.0–33.0)
MCHC: 31.8 g/dL — ABNORMAL LOW (ref 32.0–36.0)
MCV: 89 fL (ref 80.0–100.0)
MPV: 10.5 fL (ref 7.5–12.5)
Platelets: 564 10*3/uL — ABNORMAL HIGH (ref 140–400)
RBC: 3.36 10*6/uL — ABNORMAL LOW (ref 3.80–5.10)
RDW: 13.4 % (ref 11.0–15.0)
WBC: 8.5 10*3/uL (ref 3.8–10.8)

## 2022-06-19 LAB — TSH: TSH: 0.99 mIU/L (ref 0.40–4.50)

## 2022-06-19 LAB — LIPID PANEL
Cholesterol: 216 mg/dL — ABNORMAL HIGH (ref <200)
HDL: 56 mg/dL (ref >=50)
LDL Cholesterol (Calc): 125 mg/dL (calc) — ABNORMAL HIGH
Non-HDL Cholesterol (Calc): 160 mg/dL (calc) — ABNORMAL HIGH (ref <130)
Total CHOL/HDL Ratio: 3.9 (calc) (ref <5.0)
Triglycerides: 213 mg/dL — ABNORMAL HIGH (ref <150)

## 2022-06-19 LAB — VITAMIN D 25 HYDROXY (VIT D DEFICIENCY, FRACTURES): Vit D, 25-Hydroxy: 50 ng/mL (ref 30–100)

## 2022-12-04 ENCOUNTER — Other Ambulatory Visit: Payer: Self-pay | Admitting: Orthopedic Surgery

## 2022-12-04 NOTE — Progress Notes (Signed)
Anesthesia Review:  PCP: Cardiologist : Chest x-ray : EKG : Echo : Stress test: Cardiac Cath :  Activity level:  Sleep Study/ CPAP : Fasting Blood Sugar :      / Checks Blood Sugar -- times a day:   Blood Thinner/ Instructions /Last Dose: ASA / Instructions/ Last Dose :    325 mg aspirin

## 2022-12-04 NOTE — Patient Instructions (Signed)
SURGICAL WAITING ROOM VISITATION  Patients having surgery or a procedure may have no more than 2 support people in the waiting area - these visitors may rotate.    Children under the age of 2 must have an adult with them who is not the patient.  Due to an increase in RSV and influenza rates and associated hospitalizations, children ages 61 and under may not visit patients in [facility] hospitals.  If the patient needs to stay at the hospital during part of their recovery, the visitor guidelines for inpatient rooms apply. Pre-op nurse will coordinate an appropriate time for 1 support person to accompany patient in pre-op.  This support person may not rotate.    Please refer to the [facility] website for the visitor guidelines for Inpatients (after your surgery is over and you are in a regular room).       Your procedure is scheduled on:  12/21/2022    Report to [facility] Main Entrance    Report to admitting at  1000 AM   Call this number if you have problems the morning of surgery [phone]   Do not eat food :After Midnight.   After Midnight you may have the following liquids until _ 0930_____ AM DAY OF SURGERY  Water Non-Citrus Juices (without pulp, NO RED-Apple, White grape, White cranberry) Black Coffee (NO MILK/CREAM OR CREAMERS, sugar ok)  Clear Tea (NO MILK/CREAM OR CREAMERS, sugar ok) regular and decaf                             Plain Jell-O (NO RED)                                           Fruit ices (not with fruit pulp, NO RED)                                     Popsicles (NO RED)                                                               Sports drinks like Gatorade (NO RED)                   The day of surgery:  Drink ONE (1) Pre-Surgery Clear Ensure or G2 at   0930AM   ( have completed by ) the morning of surgery. Drink in one sitting. Do not sip.  This drink was given to you during your hospital  pre-op appointment visit. Nothing else to  drink after completing the  Pre-Surgery Clear Ensure or G2.          If you have questions, please contact your surgeon's office.       Oral Hygiene is also important to reduce your risk of infection.                                    Remember - BRUSH YOUR TEETH THE MORNING OF SURGERY WITH  YOUR REGULAR TOOTHPASTE  DENTURES WILL BE REMOVED PRIOR TO SURGERY PLEASE DO NOT APPLY "Poly grip" OR ADHESIVES!!!   Do NOT smoke after Midnight   Stop all vitamins and herbal supplements 7 days before surgery.   Take these medicines the morning of surgery with A SIP OF WATER:  inhalers as usual and bring, nebulizer if needed, gabapentin, omeprazole   DO NOT TAKE ANY ORAL DIABETIC MEDICATIONS DAY OF YOUR SURGERY  Bring CPAP mask and tubing day of surgery.                              You may not have any metal on your body including hair pins, jewelry, and body piercing             Do not wear make-up, lotions, powders, perfumes/cologne, or deodorant  Do not wear nail polish including gel and S&S, artificial/acrylic nails, or any other type of covering on natural nails including finger and toenails. If you have artificial nails, gel coating, etc. that needs to be removed by a nail salon please have this removed prior to surgery or surgery may need to be canceled/ delayed if the surgeon/ anesthesia feels like they are unable to be safely monitored.   Do not shave  48 hours prior to surgery.               Men may shave face and neck.   Do not bring valuables to the hospital. [facility] IS NOT             RESPONSIBLE   FOR VALUABLES.   Contacts, glasses, dentures or bridgework may not be worn into surgery.   Bring small overnight bag day of surgery.   DO NOT BRING YOUR HOME MEDICATIONS TO THE HOSPITAL. PHARMACY WILL DISPENSE MEDICATIONS LISTED ON YOUR MEDICATION LIST TO YOU DURING YOUR ADMISSION IN THE HOSPITAL!    Patients discharged on the day of surgery will not be allowed to drive  home.  Someone NEEDS to stay with you for the first 24 hours after anesthesia.   Special Instructions: Bring a copy of your healthcare power of attorney and living will documents the day of surgery if you haven't scanned them before.              Please read over the following fact sheets you were given: IF YOU HAVE QUESTIONS ABOUT YOUR PRE-OP INSTRUCTIONS PLEASE CALL [phone]   If you received a COVID test during your pre-op visit  it is requested that you wear a mask when out in public, stay away from anyone that may not be feeling well and notify your surgeon if you develop symptoms. If you test positive for Covid or have been in contact with anyone that has tested positive in the last 10 days please notify you surgeon.      Pre-operative 5 CHG Bath Instructions   You can play a key role in reducing the risk of infection after surgery. Your skin needs to be as free of germs as possible. You can reduce the number of germs on your skin by washing with CHG (chlorhexidine gluconate) soap before surgery. CHG is an antiseptic soap that kills germs and continues to kill germs even after washing.   DO NOT use if you have an allergy to chlorhexidine/CHG or antibacterial soaps. If your skin becomes reddened or irritated, stop using the CHG and notify one of our RNs at [phone].  Please shower with the CHG soap starting 4 days before surgery using the following schedule:     Please keep in mind the following:  DO NOT shave, including legs and underarms, starting the day of your first shower.   You may shave your face at any point before/day of surgery.  Place clean sheets on your bed the day you start using CHG soap. Use a clean washcloth (not used since being washed) for each shower. DO NOT sleep with pets once you start using the CHG.   CHG Shower Instructions:  If you choose to wash your hair and private area, wash first with your normal shampoo/soap.  After you use shampoo/soap,  rinse your hair and body thoroughly to remove shampoo/soap residue.  Turn the water OFF and apply about 3 tablespoons (45 ml) of CHG soap to a CLEAN washcloth.  Apply CHG soap ONLY FROM YOUR NECK DOWN TO YOUR TOES (washing for 3-5 minutes)  DO NOT use CHG soap on face, private areas, open wounds, or sores.  Pay special attention to the area where your surgery is being performed.  If you are having back surgery, having someone wash your back for you may be helpful. Wait 2 minutes after CHG soap is applied, then you may rinse off the CHG soap.  Pat dry with a clean towel  Put on clean clothes/pajamas   If you choose to wear lotion, please use ONLY the CHG-compatible lotions on the back of this paper.     Additional instructions for the day of surgery: DO NOT APPLY any lotions, deodorants, cologne, or perfumes.   Put on clean/comfortable clothes.  Brush your teeth.  Ask your nurse before applying any prescription medications to the skin.      CHG Compatible Lotions   Aveeno Moisturizing lotion  Cetaphil Moisturizing Cream  Cetaphil Moisturizing Lotion  Clairol Herbal Essence Moisturizing Lotion, Dry Skin  Clairol Herbal Essence Moisturizing Lotion, Extra Dry Skin  Clairol Herbal Essence Moisturizing Lotion, Normal Skin  Curel Age Defying Therapeutic Moisturizing Lotion with Alpha Hydroxy  Curel Extreme Care Body Lotion  Curel Soothing Hands Moisturizing Hand Lotion  Curel Therapeutic Moisturizing Cream, Fragrance-Free  Curel Therapeutic Moisturizing Lotion, Fragrance-Free  Curel Therapeutic Moisturizing Lotion, Original Formula  Eucerin Daily Replenishing Lotion  Eucerin Dry Skin Therapy Plus Alpha Hydroxy Crme  Eucerin Dry Skin Therapy Plus Alpha Hydroxy Lotion  Eucerin Original Crme  Eucerin Original Lotion  Eucerin Plus Crme Eucerin Plus Lotion  Eucerin TriLipid Replenishing Lotion  Keri Anti-Bacterial Hand Lotion  Keri Deep Conditioning Original Lotion Dry Skin Formula  Softly Scented  Keri Deep Conditioning Original Lotion, Fragrance Free Sensitive Skin Formula  Keri Lotion Fast Absorbing Fragrance Free Sensitive Skin Formula  Keri Lotion Fast Absorbing Softly Scented Dry Skin Formula  Keri Original Lotion  Keri Skin Renewal Lotion Keri Silky Smooth Lotion  Keri Silky Smooth Sensitive Skin Lotion  Nivea Body Creamy Conditioning Oil  Nivea Body Extra Enriched Teacher, adult education Moisturizing Lotion Nivea Crme  Nivea Skin Firming Lotion  NutraDerm 30 Skin Lotion  NutraDerm Skin Lotion  NutraDerm Therapeutic Skin Cream  NutraDerm Therapeutic Skin Lotion  ProShield Protective Hand Cream  Provon moisturizing lotion

## 2022-12-08 ENCOUNTER — Other Ambulatory Visit: Payer: Self-pay

## 2022-12-08 ENCOUNTER — Ambulatory Visit (HOSPITAL_COMMUNITY)
Admission: RE | Admit: 2022-12-08 | Discharge: 2022-12-08 | Disposition: A | Discharge status: Home / Self Care | Payer: 59 | Source: Ambulatory Visit | Attending: Orthopedic Surgery | Admitting: Orthopedic Surgery

## 2022-12-08 ENCOUNTER — Encounter (HOSPITAL_COMMUNITY): Payer: Self-pay

## 2022-12-08 ENCOUNTER — Encounter (HOSPITAL_COMMUNITY)
Admission: RE | Admit: 2022-12-08 | Discharge: 2022-12-08 | Disposition: A | Discharge status: Home / Self Care | Payer: 59 | Source: Ambulatory Visit | Attending: Orthopedic Surgery | Admitting: Orthopedic Surgery

## 2022-12-08 VITALS — BP 138/82 | HR 87 | Temp 98.6°F | Resp 16 | Ht 61.0 in | Wt 182.0 lb

## 2022-12-08 DIAGNOSIS — Z01818 Encounter for other preprocedural examination: Secondary | ICD-10-CM | POA: Diagnosis present

## 2022-12-08 LAB — BASIC METABOLIC PANEL
Anion gap: 6 (ref 5–15)
BUN: 15 mg/dL (ref 8–23)
CO2: 25 mmol/L (ref 22–32)
Calcium: 9.4 mg/dL (ref 8.9–10.3)
Chloride: 105 mmol/L (ref 98–111)
Creatinine, Ser: 0.69 mg/dL (ref 0.44–1.00)
GFR, Estimated: >60 mL/min (ref >60)
Glucose, Bld: 104 mg/dL — ABNORMAL HIGH (ref 70–99)
Potassium: 4.6 mmol/L (ref 3.5–5.1)
Sodium: 136 mmol/L (ref 135–145)

## 2022-12-08 LAB — CBC
HCT: 34.5 % — ABNORMAL LOW (ref 36.0–46.0)
Hemoglobin: 10.8 g/dL — ABNORMAL LOW (ref 12.0–15.0)
MCH: 28.3 pg (ref 26.0–34.0)
MCHC: 31.3 g/dL (ref 30.0–36.0)
MCV: 90.6 fL (ref 80.0–100.0)
Platelets: 394 10*3/uL (ref 150–400)
RBC: 3.81 MIL/uL — ABNORMAL LOW (ref 3.87–5.11)
RDW: 14.3 % (ref 11.5–15.5)
WBC: 8.3 10*3/uL (ref 4.0–10.5)
nRBC: 0 % (ref 0.0–0.2)

## 2022-12-08 LAB — SURGICAL PCR SCREEN
MRSA, PCR: NEGATIVE
Staphylococcus aureus: POSITIVE — AB

## 2022-12-09 ENCOUNTER — Encounter (HOSPITAL_COMMUNITY): Payer: Self-pay

## 2022-12-18 NOTE — Progress Notes (Signed)
1840 Left a second message about her surgery time change for Monday, 12-21-22, to arrive at 0730. Asked to follow the instructions from PAT may have the pre surgery drink by 0630.  Asked to call Short Stay Monday, 12-21-22 and let the staff know she received a call to report earlier for her surgery.

## 2022-12-20 NOTE — H&P (Signed)
TOTAL KNEE ADMISSION H&P  Patient is being admitted for right total knee arthroplasty.  Subjective:  Chief Complaint:right knee pain.  HPI: Tabitha Wilcox, 67 y.o. adult, has a history of pain and functional disability in the right knee due to arthritis and has failed non-surgical conservative treatments for greater than 12 weeks to includeNSAID's and/or analgesics, corticosteriod injections, viscosupplementation injections, weight reduction as appropriate, and activity modification.  Onset of symptoms was gradual, starting 8 years ago with gradually worsening course since that time. The patient noted no past surgery on the right knee(s).  Patient currently rates pain in the right knee(s) at 9 out of 10 with activity. Patient has night pain, worsening of pain with activity and weight bearing, pain that interferes with activities of daily living, pain with passive range of motion, and joint swelling.  Patient has evidence of subchondral cysts, subchondral sclerosis, periarticular osteophytes, joint subluxation, and joint space narrowing by imaging studies. This patient has had  Failure of All reasonable conservative care . There is no active infection.  Patient Active Problem List   Diagnosis Date Noted   Primary osteoarthritis of left knee 12/30/2018   Spondylolisthesis of lumbosacral region 11/02/2016   Strain of right upper arm 07/30/2016   Hepatitis C antibody test positive 02/08/2015   Thoracic arthritis 12/21/2014   DJD (degenerative joint disease) of knee 10/20/2014   Obesity 07/20/2014   Pre-diabetes 07/20/2014   Insomnia 07/20/2014   Cervical spine arthritis (HCC) 03/16/2014   Family history of glaucoma 10/13/2013   Toenail deformity 06/14/2013   Osteopenia 05/08/2013   Healthcare maintenance 12/19/2012   Alcohol dependence in remission (HCC) 08/15/2012   Special screening for malignant neoplasms, colon 08/19/2011   Medial meniscus tear 07/14/2011   Tobacco abuse 04/13/2011    Knee pain, bilateral 03/31/2011   Ankle pain 03/12/2011   Esophageal stricture 03/12/2011   Asthma, chronic 12/26/2010   GERD (gastroesophageal reflux disease) 12/26/2010   Carpal tunnel syndrome on both sides    Rotator cuff injury     lumar surgery  PLIF L5-S1 02/08/2009   DEPRESSION 12/27/2008   Past Medical History:  Diagnosis Date   Allergy    Anxiety    Arthritis    Asthma    and bronchitisl reprots her sob is due to her asthma    Carpal tunnel syndrome on both sides    Depression    GERD (gastroesophageal reflux disease)    Rotator cuff injury    Right side   Sciatica    Shortness of breath    sob with exertion    Tobacco abuse    Ulcer    1990's    Past Surgical History:  Procedure Laterality Date   APPENDECTOMY     APPLICATION OF ROBOTIC ASSISTANCE FOR SPINAL PROCEDURE N/A 11/02/2016   Procedure: APPLICATION OF ROBOTIC ASSISTANCE FOR SPINAL PROCEDURE;  Surgeon: Ditty, Loura Halt, MD;  Location: [facility] OR;  Service: Neurosurgery;  Laterality: N/A;   EXPLORATORY LAPAROTOMY     reports they puntured something when they were taking the appendix , and had to go back and put in a drain     IRRIGATION AND DEBRIDEMENT ABSCESS Right 07/05/2012   Procedure: IRRIGATION AND DEBRIDEMENT RIGHT LABIAL/THIGH ABSCESS;  Surgeon: Shelly Rubenstein, MD;  Location: [facility] OR;  Service: General;  Laterality: Right;   left hip replacement      SHOULDER ARTHROSCOPY W/ ROTATOR CUFF REPAIR Right 2015   TOTAL KNEE ARTHROPLASTY Left 12/30/2018   Procedure: TOTAL KNEE  ARTHROPLASTY;  Surgeon: Jodi Geralds, MD;  Location: [facility] ORS;  Service: Orthopedics;  Laterality: Left;   UPPER GASTROINTESTINAL ENDOSCOPY      No current facility-administered medications for this encounter.   Current Outpatient Medications  Medication Sig Dispense Refill Last Dose   albuterol (PROAIR HFA) 108 (90 Base) MCG/ACT inhaler Inhale 1-2 puffs into the lungs every 6 (six) hours as needed for wheezing or shortness of  breath. 18 g 3    albuterol (PROVENTIL) (2.5 MG/3ML) 0.083% nebulizer solution Take 3 mLs (2.5 mg total) by nebulization every 6 (six) hours as needed for wheezing or shortness of breath. 75 mL 12    Calcium Carbonate-Vitamin D (CALCIUM 600+D PO) Take 1 tablet by mouth daily.      celecoxib (CELEBREX) 200 MG capsule Take 200 mg by mouth 2 (two) times daily.  5    cyclobenzaprine (FLEXERIL) 10 MG tablet Take 10 mg by mouth daily.  10    diclofenac Sodium (VOLTAREN) 1 % GEL Apply 2 g topically 4 (four) times daily. (Patient taking differently: Apply 2 g topically 4 (four) times daily as needed (joint pain).) 300 g 1    docusate sodium (COLACE) 100 MG capsule Take 1 capsule (100 mg total) by mouth 2 (two) times daily. (Patient taking differently: Take 100 mg by mouth 2 (two) times daily as needed for moderate constipation.) 30 capsule 0    gabapentin (NEURONTIN) 600 MG tablet Take 600 mg by mouth 2 (two) times daily.      Krill Oil 350 MG CAPS Take 350 mg by mouth daily.       latanoprost (XALATAN) 0.005 % ophthalmic solution Place 1 drop into both eyes at bedtime.      meloxicam (MOBIC) 15 MG tablet Take 15 mg by mouth daily.      Multiple Vitamin (MULTIVITAMIN WITH MINERALS) TABS tablet Take 1 tablet by mouth daily. Centrum      omeprazole (PRILOSEC) 20 MG capsule Take 1 capsule (20 mg total) by mouth daily before lunch. 90 capsule 0    timolol (TIMOPTIC) 0.25 % ophthalmic solution Place 1 drop into both eyes every morning.      tiZANidine (ZANAFLEX) 4 MG tablet Take 4 mg by mouth at bedtime.      traMADol (ULTRAM) 50 MG tablet Take 50 mg by mouth every 8 (eight) hours as needed for moderate pain.      triamcinolone cream (KENALOG) 0.5 % Apply 1 Application topically 2 (two) times daily as needed (rash).      Allergies  Allergen Reactions   Hydrocodone Itching   Oxycodone Itching    Social History   Tobacco Use   Smoking status: Former    Current packs/day: 0.25    Average packs/day: 0.3  packs/day for 25.0 years (6.3 ttl pk-yrs)    Types: Cigarettes   Smokeless tobacco: Never   Tobacco comments:    Currently smoking less than 1/4  pack per day depending on how stressed she is.. 12-23-2018 confirmd the previous statement   Substance Use Topics   Alcohol use: Yes    Comment: Martini sometimes.(1 a day) wine cooler    Family History  Problem Relation Age of Onset   Colon cancer Maternal Aunt    Colon cancer Paternal Uncle    Esophageal cancer Neg Hx    Rectal cancer Neg Hx    Stomach cancer Neg Hx      Review of Systems ROS: I have reviewed the patient's review of systems thoroughly  and there are no positive responses as relates to the HPI.  Objective:  Physical Exam  Vital signs in last 24 hours:   There were no vitals taken for this visit.  Well-developed well-nourished patient in no acute distress. Alert and oriented x3 HEENT:within normal limits Cardiac: Regular rate and rhythm Pulmonary: Lungs clear to auscultation Abdomen: Soft and nontender.  Normal active bowel sounds  Musculoskeletal: (Right knee: Painful range of motion.  Limited range of motion.  No instability.  Trace effusion.  No erythema or warmth.  Neurovascular intact distally.)    Estimated body mass index is 34.39 kg/m as calculated from the following:   Height as of 12/08/22: 5\' 1"  (1.549 m).   Weight as of 12/08/22: 82.6 kg.   Imaging Review Plain radiographs demonstrate severe degenerative joint disease of the right knee(s). The overall alignment ismild varus. The bone quality appears to be fair for age and reported activity level.      Assessment/Plan:  End stage arthritis, right knee   The patient history, physical examination, clinical judgment of the provider and imaging studies are consistent with end stage degenerative joint disease of the right knee(s) and total knee arthroplasty is deemed medically necessary. The treatment options including medical management, injection  therapy arthroscopy and arthroplasty were discussed at length. The risks and benefits of total knee arthroplasty were presented and reviewed. The risks due to aseptic loosening, infection, stiffness, patella tracking problems, thromboembolic complications and other imponderables were discussed. The patient acknowledged the explanation, agreed to proceed with the plan and consent was signed. Patient is being admitted for inpatient treatment for surgery, pain control, PT, OT, prophylactic antibiotics, VTE prophylaxis, progressive ambulation and ADL's and discharge planning. The patient is planning to be discharged home with home health services     Patient's anticipated LOS is less than 2 midnights, meeting these requirements: - Younger than 36 - Lives within 1 hour of care - Has a competent adult at home to recover with post-op recover - NO history of  - Chronic pain requiring opiods  - Diabetes  - Coronary Artery Disease  - Heart failure  - Heart attack  - Stroke  - DVT/VTE  - Cardiac arrhythmia  - Respiratory Failure/COPD  - Renal failure  - Anemia  - Advanced Liver disease

## 2022-12-21 ENCOUNTER — Observation Stay (HOSPITAL_COMMUNITY)
Admission: RE | Admit: 2022-12-21 | Discharge: 2022-12-23 | Disposition: A | Discharge status: Home / Self Care | Payer: 59 | Source: Ambulatory Visit | Attending: Orthopedic Surgery | Admitting: Orthopedic Surgery

## 2022-12-21 ENCOUNTER — Encounter (HOSPITAL_COMMUNITY): Payer: Self-pay | Admitting: Orthopedic Surgery

## 2022-12-21 ENCOUNTER — Ambulatory Visit (HOSPITAL_COMMUNITY): Payer: 59 | Admitting: Medical

## 2022-12-21 ENCOUNTER — Other Ambulatory Visit: Payer: Self-pay

## 2022-12-21 ENCOUNTER — Encounter (HOSPITAL_COMMUNITY)
Admission: RE | Disposition: A | Discharge status: Home / Self Care | Payer: Self-pay | Source: Ambulatory Visit | Attending: Orthopedic Surgery

## 2022-12-21 ENCOUNTER — Ambulatory Visit (HOSPITAL_COMMUNITY): Payer: 59 | Admitting: Anesthesiology

## 2022-12-21 DIAGNOSIS — Z87891 Personal history of nicotine dependence: Secondary | ICD-10-CM | POA: Diagnosis not present

## 2022-12-21 DIAGNOSIS — M1712 Unilateral primary osteoarthritis, left knee: Secondary | ICD-10-CM

## 2022-12-21 DIAGNOSIS — Z96652 Presence of left artificial knee joint: Secondary | ICD-10-CM | POA: Diagnosis not present

## 2022-12-21 DIAGNOSIS — Z01818 Encounter for other preprocedural examination: Secondary | ICD-10-CM

## 2022-12-21 DIAGNOSIS — M21371 Foot drop, right foot: Secondary | ICD-10-CM | POA: Diagnosis not present

## 2022-12-21 DIAGNOSIS — Z79899 Other long term (current) drug therapy: Secondary | ICD-10-CM | POA: Diagnosis not present

## 2022-12-21 DIAGNOSIS — M1711 Unilateral primary osteoarthritis, right knee: Principal | ICD-10-CM | POA: Diagnosis present

## 2022-12-21 DIAGNOSIS — J45909 Unspecified asthma, uncomplicated: Secondary | ICD-10-CM | POA: Diagnosis not present

## 2022-12-21 HISTORY — PX: TOTAL KNEE ARTHROPLASTY: SHX125

## 2022-12-21 SURGERY — ARTHROPLASTY, KNEE, TOTAL
Anesthesia: Monitor Anesthesia Care | Site: Knee | Laterality: Right

## 2022-12-21 MED ORDER — DOCUSATE SODIUM 100 MG PO CAPS
100.0000 mg | ORAL_CAPSULE | Freq: Two times a day (BID) | ORAL | Status: DC
  Administered 2022-12-21 – 2022-12-23 (×5): 100 mg via ORAL
  Filled 2022-12-21 (×5): qty 1

## 2022-12-21 MED ORDER — BUPIVACAINE-EPINEPHRINE 0.25% -1:200000 IJ SOLN
INTRAMUSCULAR | Status: AC
  Filled 2022-12-21: qty 1

## 2022-12-21 MED ORDER — FENTANYL CITRATE (PF) 100 MCG/2ML IJ SOLN
INTRAMUSCULAR | Status: DC | PRN
  Administered 2022-12-21: 50 ug via INTRAVENOUS
  Administered 2022-12-21 (×2): 12.5 ug via INTRAVENOUS

## 2022-12-21 MED ORDER — ONDANSETRON HCL 4 MG/2ML IJ SOLN: 4.0000 mg | Freq: Four times a day (QID) | INTRAMUSCULAR | Status: DC | PRN

## 2022-12-21 MED ORDER — DIPHENHYDRAMINE HCL 12.5 MG/5ML PO ELIX: 12.5000 mg | ORAL_SOLUTION | ORAL | Status: DC | PRN

## 2022-12-21 MED ORDER — TIZANIDINE HCL 2 MG PO TABS
2.0000 mg | ORAL_TABLET | Freq: Four times a day (QID) | ORAL | 0 refills | Status: DC | PRN
Start: 2022-12-21 — End: 2023-12-12

## 2022-12-21 MED ORDER — CELECOXIB 200 MG PO CAPS
200.0000 mg | ORAL_CAPSULE | Freq: Two times a day (BID) | ORAL | Status: DC
  Administered 2022-12-21 – 2022-12-23 (×4): 200 mg via ORAL
  Filled 2022-12-21 (×4): qty 1

## 2022-12-21 MED ORDER — ONDANSETRON HCL 4 MG/2ML IJ SOLN: 4.0000 mg | Freq: Once | INTRAMUSCULAR | Status: DC | PRN

## 2022-12-21 MED ORDER — OXYCODONE HCL 5 MG/5ML PO SOLN: 5.0000 mg | Freq: Once | ORAL | Status: DC | PRN

## 2022-12-21 MED ORDER — BUPIVACAINE LIPOSOME 1.3 % IJ SUSP: 20.0000 mL | Freq: Once | INTRAMUSCULAR | Status: DC

## 2022-12-21 MED ORDER — MEPERIDINE HCL 50 MG/ML IJ SOLN: 6.2500 mg | INTRAMUSCULAR | Status: DC | PRN

## 2022-12-21 MED ORDER — LATANOPROST 0.005 % OP SOLN
1.0000 [drp] | Freq: Every day | OPHTHALMIC | Status: DC
  Administered 2022-12-21 – 2022-12-22 (×2): 1 [drp] via OPHTHALMIC
  Filled 2022-12-21: qty 2.5

## 2022-12-21 MED ORDER — 0.9 % SODIUM CHLORIDE (POUR BTL) OPTIME
TOPICAL | Status: DC | PRN
  Administered 2022-12-21: 1000 mL

## 2022-12-21 MED ORDER — PHENYLEPHRINE HCL-NACL 20-0.9 MG/250ML-% IV SOLN
INTRAVENOUS | Status: DC | PRN
Start: 2022-12-21 — End: 2022-12-21
  Administered 2022-12-21: 40 ug/min via INTRAVENOUS

## 2022-12-21 MED ORDER — ALBUTEROL SULFATE (2.5 MG/3ML) 0.083% IN NEBU: 2.5000 mg | INHALATION_SOLUTION | Freq: Four times a day (QID) | RESPIRATORY_TRACT | Status: DC | PRN

## 2022-12-21 MED ORDER — ZOLPIDEM TARTRATE 5 MG PO TABS: 5.0000 mg | ORAL_TABLET | Freq: Every evening | ORAL | Status: DC | PRN

## 2022-12-21 MED ORDER — FLEET ENEMA RE ENEM: 1.0000 | ENEMA | Freq: Once | RECTAL | Status: DC | PRN

## 2022-12-21 MED ORDER — ROPIVACAINE HCL 5 MG/ML IJ SOLN
INTRAMUSCULAR | Status: DC | PRN
Start: 2022-12-21 — End: 2022-12-21
  Administered 2022-12-21: 20 mL via PERINEURAL

## 2022-12-21 MED ORDER — OXYCODONE HCL 5 MG PO TABS: 5.0000 mg | ORAL_TABLET | Freq: Once | ORAL | Status: DC | PRN

## 2022-12-21 MED ORDER — LIDOCAINE HCL (PF) 2 % IJ SOLN
INTRAMUSCULAR | Status: AC
  Filled 2022-12-21: qty 5

## 2022-12-21 MED ORDER — PANTOPRAZOLE SODIUM 40 MG PO TBEC
40.0000 mg | DELAYED_RELEASE_TABLET | Freq: Every day | ORAL | Status: DC
  Administered 2022-12-21 – 2022-12-23 (×3): 40 mg via ORAL
  Filled 2022-12-21 (×3): qty 1

## 2022-12-21 MED ORDER — HYDROMORPHONE HCL 1 MG/ML IJ SOLN
INTRAMUSCULAR | Status: DC | PRN
Start: 2022-12-21 — End: 2022-12-21
  Administered 2022-12-21: .5 mg via INTRAVENOUS

## 2022-12-21 MED ORDER — ALBUTEROL SULFATE HFA 108 (90 BASE) MCG/ACT IN AERS: 1.0000 | INHALATION_SPRAY | Freq: Four times a day (QID) | RESPIRATORY_TRACT | Status: DC | PRN

## 2022-12-21 MED ORDER — POVIDONE-IODINE 10 % EX SWAB: 2.0000 | Freq: Once | CUTANEOUS | Status: DC

## 2022-12-21 MED ORDER — HYDROMORPHONE HCL 2 MG PO TABS
2.0000 mg | ORAL_TABLET | ORAL | Status: DC | PRN
  Administered 2022-12-21 – 2022-12-22 (×2): 2 mg via ORAL
  Administered 2022-12-23: 3 mg via ORAL
  Filled 2022-12-21 (×2): qty 1
  Filled 2022-12-21: qty 2

## 2022-12-21 MED ORDER — ORAL CARE MOUTH RINSE: 15.0000 mL | Freq: Once | OROMUCOSAL | Status: AC

## 2022-12-21 MED ORDER — LIDOCAINE HCL (CARDIAC) PF 100 MG/5ML IV SOSY
PREFILLED_SYRINGE | INTRAVENOUS | Status: DC | PRN
  Administered 2022-12-21: 40 mg via INTRAVENOUS

## 2022-12-21 MED ORDER — CHLORHEXIDINE GLUCONATE 0.12 % MT SOLN
15.0000 mL | Freq: Once | OROMUCOSAL | Status: AC
  Administered 2022-12-21: 15 mL via OROMUCOSAL

## 2022-12-21 MED ORDER — ASPIRIN 81 MG PO TBEC: 81.0000 mg | DELAYED_RELEASE_TABLET | Freq: Two times a day (BID) | ORAL | 0 refills | Status: DC

## 2022-12-21 MED ORDER — SODIUM CHLORIDE 0.9 % IV SOLN: INTRAVENOUS | Status: AC | PRN

## 2022-12-21 MED ORDER — ONDANSETRON HCL 4 MG/2ML IJ SOLN
INTRAMUSCULAR | Status: DC | PRN
  Administered 2022-12-21: 4 mg via INTRAVENOUS

## 2022-12-21 MED ORDER — TRANEXAMIC ACID-NACL 1000-0.7 MG/100ML-% IV SOLN
1000.0000 mg | INTRAVENOUS | Status: AC
  Administered 2022-12-21: 1000 mg via INTRAVENOUS
  Filled 2022-12-21: qty 100

## 2022-12-21 MED ORDER — DOCUSATE SODIUM 100 MG PO CAPS: 100.0000 mg | ORAL_CAPSULE | Freq: Two times a day (BID) | ORAL | Status: DC | PRN

## 2022-12-21 MED ORDER — TIMOLOL MALEATE 0.25 % OP SOLN
1.0000 [drp] | Freq: Every morning | OPHTHALMIC | Status: DC
  Administered 2022-12-22: 1 [drp] via OPHTHALMIC
  Filled 2022-12-21: qty 5

## 2022-12-21 MED ORDER — FENTANYL CITRATE PF 50 MCG/ML IJ SOSY: 25.0000 ug | PREFILLED_SYRINGE | INTRAMUSCULAR | Status: DC | PRN

## 2022-12-21 MED ORDER — BUPIVACAINE IN DEXTROSE 0.75-8.25 % IT SOLN
INTRATHECAL | Status: DC | PRN
Start: 2022-12-21 — End: 2022-12-21
  Administered 2022-12-21: 1.6 mL via INTRATHECAL

## 2022-12-21 MED ORDER — MIDAZOLAM HCL 5 MG/5ML IJ SOLN
INTRAMUSCULAR | Status: DC | PRN
  Administered 2022-12-21: .5 mg via INTRAVENOUS

## 2022-12-21 MED ORDER — ACETAMINOPHEN 160 MG/5ML PO SOLN: 325.0000 mg | ORAL | Status: DC | PRN

## 2022-12-21 MED ORDER — POLYETHYLENE GLYCOL 3350 17 G PO PACK: 17.0000 g | PACK | Freq: Every day | ORAL | Status: DC | PRN

## 2022-12-21 MED ORDER — MIDAZOLAM HCL 2 MG/2ML IJ SOLN
INTRAMUSCULAR | Status: AC
  Filled 2022-12-21: qty 2

## 2022-12-21 MED ORDER — GABAPENTIN 300 MG PO CAPS
600.0000 mg | ORAL_CAPSULE | Freq: Two times a day (BID) | ORAL | Status: DC
  Administered 2022-12-21 – 2022-12-23 (×5): 600 mg via ORAL
  Filled 2022-12-21 (×5): qty 2

## 2022-12-21 MED ORDER — SODIUM CHLORIDE (PF) 0.9 % IJ SOLN
INTRAMUSCULAR | Status: AC
  Filled 2022-12-21: qty 50

## 2022-12-21 MED ORDER — PROPOFOL 1000 MG/100ML IV EMUL
INTRAVENOUS | Status: AC
  Filled 2022-12-21: qty 100

## 2022-12-21 MED ORDER — PHENOL 1.4 % MT LIQD: 1.0000 | OROMUCOSAL | Status: DC | PRN

## 2022-12-21 MED ORDER — BUPIVACAINE LIPOSOME 1.3 % IJ SUSP
INTRAMUSCULAR | Status: AC
  Filled 2022-12-21: qty 20

## 2022-12-21 MED ORDER — SODIUM CHLORIDE 0.9 % IR SOLN
Status: DC | PRN
  Administered 2022-12-21: 1000 mL

## 2022-12-21 MED ORDER — PHENYLEPHRINE HCL-NACL 20-0.9 MG/250ML-% IV SOLN
INTRAVENOUS | Status: AC
  Filled 2022-12-21: qty 250

## 2022-12-21 MED ORDER — ONDANSETRON HCL 4 MG/2ML IJ SOLN
INTRAMUSCULAR | Status: AC
  Filled 2022-12-21: qty 2

## 2022-12-21 MED ORDER — MIDAZOLAM HCL 2 MG/2ML IJ SOLN
2.0000 mg | INTRAMUSCULAR | Status: DC
  Administered 2022-12-21: 2 mg via INTRAVENOUS
  Filled 2022-12-21: qty 2

## 2022-12-21 MED ORDER — METOCLOPRAMIDE HCL 5 MG PO TABS: 5.0000 mg | ORAL_TABLET | Freq: Three times a day (TID) | ORAL | Status: DC | PRN

## 2022-12-21 MED ORDER — SODIUM CHLORIDE (PF) 0.9 % IJ SOLN
INTRAMUSCULAR | Status: DC | PRN
  Administered 2022-12-21: 100 mL

## 2022-12-21 MED ORDER — FENTANYL CITRATE PF 50 MCG/ML IJ SOSY
100.0000 ug | PREFILLED_SYRINGE | INTRAMUSCULAR | Status: DC
  Administered 2022-12-21: 100 ug via INTRAVENOUS
  Filled 2022-12-21: qty 2

## 2022-12-21 MED ORDER — PROPOFOL 500 MG/50ML IV EMUL
INTRAVENOUS | Status: DC | PRN
  Administered 2022-12-21: 50 ug/kg/min via INTRAVENOUS

## 2022-12-21 MED ORDER — HYDROMORPHONE HCL 2 MG PO TABS
1.0000 mg | ORAL_TABLET | ORAL | Status: DC | PRN
  Administered 2022-12-22 – 2022-12-23 (×3): 2 mg via ORAL
  Filled 2022-12-21 (×4): qty 1

## 2022-12-21 MED ORDER — HYDROMORPHONE HCL 1 MG/ML IJ SOLN: 0.5000 mg | INTRAMUSCULAR | Status: DC | PRN

## 2022-12-21 MED ORDER — TRANEXAMIC ACID-NACL 1000-0.7 MG/100ML-% IV SOLN
1000.0000 mg | Freq: Once | INTRAVENOUS | Status: AC
  Administered 2022-12-21: 1000 mg via INTRAVENOUS
  Filled 2022-12-21: qty 100

## 2022-12-21 MED ORDER — DEXAMETHASONE SODIUM PHOSPHATE 10 MG/ML IJ SOLN
INTRAMUSCULAR | Status: AC
  Filled 2022-12-21: qty 1

## 2022-12-21 MED ORDER — DEXMEDETOMIDINE HCL IN NACL 80 MCG/20ML IV SOLN
INTRAVENOUS | Status: DC | PRN
Start: 2022-12-21 — End: 2022-12-21
  Administered 2022-12-21: 4 ug via INTRAVENOUS

## 2022-12-21 MED ORDER — ASPIRIN 81 MG PO CHEW
81.0000 mg | CHEWABLE_TABLET | Freq: Two times a day (BID) | ORAL | Status: DC
  Administered 2022-12-21 – 2022-12-23 (×4): 81 mg via ORAL
  Filled 2022-12-21 (×4): qty 1

## 2022-12-21 MED ORDER — WATER FOR IRRIGATION, STERILE IR SOLN
Status: DC | PRN
  Administered 2022-12-21: 1000 mL

## 2022-12-21 MED ORDER — MENTHOL 3 MG MT LOZG: 1.0000 | LOZENGE | OROMUCOSAL | Status: DC | PRN

## 2022-12-21 MED ORDER — ACETAMINOPHEN 500 MG PO TABS
1000.0000 mg | ORAL_TABLET | Freq: Four times a day (QID) | ORAL | Status: AC
  Administered 2022-12-21 – 2022-12-22 (×4): 1000 mg via ORAL
  Filled 2022-12-21 (×4): qty 2

## 2022-12-21 MED ORDER — BISACODYL 5 MG PO TBEC: 5.0000 mg | DELAYED_RELEASE_TABLET | Freq: Every day | ORAL | Status: DC | PRN

## 2022-12-21 MED ORDER — LACTATED RINGERS IV SOLN: INTRAVENOUS | Status: DC

## 2022-12-21 MED ORDER — ACETAMINOPHEN 325 MG PO TABS
325.0000 mg | ORAL_TABLET | Freq: Four times a day (QID) | ORAL | Status: DC | PRN
  Administered 2022-12-23: 650 mg via ORAL
  Filled 2022-12-21: qty 2

## 2022-12-21 MED ORDER — ACETAMINOPHEN 325 MG PO TABS: 325.0000 mg | ORAL_TABLET | ORAL | Status: DC | PRN

## 2022-12-21 MED ORDER — HYDROMORPHONE HCL 2 MG PO TABS
2.0000 mg | ORAL_TABLET | ORAL | 0 refills | Status: DC | PRN
Start: 2022-12-21 — End: 2023-12-12

## 2022-12-21 MED ORDER — SODIUM CHLORIDE 0.9% FLUSH: 3.0000 mL | Freq: Two times a day (BID) | INTRAVENOUS | Status: DC

## 2022-12-21 MED ORDER — METOCLOPRAMIDE HCL 5 MG/ML IJ SOLN: 5.0000 mg | Freq: Three times a day (TID) | INTRAMUSCULAR | Status: DC | PRN

## 2022-12-21 MED ORDER — FENTANYL CITRATE (PF) 100 MCG/2ML IJ SOLN
INTRAMUSCULAR | Status: AC
  Filled 2022-12-21: qty 2

## 2022-12-21 MED ORDER — CEFAZOLIN SODIUM-DEXTROSE 2-4 GM/100ML-% IV SOLN
2.0000 g | INTRAVENOUS | Status: AC
  Administered 2022-12-21: 2 g via INTRAVENOUS
  Filled 2022-12-21: qty 100

## 2022-12-21 MED ORDER — ALUM & MAG HYDROXIDE-SIMETH 200-200-20 MG/5ML PO SUSP: 30.0000 mL | ORAL | Status: DC | PRN

## 2022-12-21 MED ORDER — ONDANSETRON HCL 4 MG PO TABS: 4.0000 mg | ORAL_TABLET | Freq: Four times a day (QID) | ORAL | Status: DC | PRN

## 2022-12-21 SURGICAL SUPPLY — 60 items
APL SKNCLS STERI-STRIP NONHPOA (GAUZE/BANDAGES/DRESSINGS) ×1
ATTUNE PS FEM RT SZ 4 CEM KNEE (Femur) IMPLANT
ATTUNE PSRP INSR SZ4 10 KNEE (Insert) IMPLANT
BAG COUNTER SPONGE SURGICOUNT (BAG) IMPLANT
BAG SPEC THK2 15X12 ZIP CLS (MISCELLANEOUS) ×1
BAG SPNG CNTER NS LX DISP (BAG)
BAG ZIPLOCK 12X15 (MISCELLANEOUS) ×2 IMPLANT
BASE TIBIAL ROT PLAT SZ 5 KNEE (Knees) IMPLANT
BENZOIN TINCTURE PRP APPL 2/3 (GAUZE/BANDAGES/DRESSINGS) ×2 IMPLANT
BLADE SAGITTAL 25.0X1.19X90 (BLADE) ×2 IMPLANT
BLADE SAW SGTL 13.0X1.19X90.0M (BLADE) ×2 IMPLANT
BLADE SURG SZ10 CARB STEEL (BLADE) ×4 IMPLANT
BNDG CMPR 6 X 5 YARDS HK CLSR (GAUZE/BANDAGES/DRESSINGS) ×1
BNDG ELASTIC 6INX 5YD STR LF (GAUZE/BANDAGES/DRESSINGS) ×2 IMPLANT
BOOTIES KNEE HIGH SLOAN (MISCELLANEOUS) ×2 IMPLANT
BOWL SMART MIX CTS (DISPOSABLE) ×2 IMPLANT
BSPLAT TIB 5 CMNT ROT PLAT STR (Knees) ×1 IMPLANT
CEMENT HV SMART SET (Cement) ×4 IMPLANT
COVER SURGICAL LIGHT HANDLE (MISCELLANEOUS) ×2 IMPLANT
CUFF TOURN SGL QUICK 34 (TOURNIQUET CUFF) ×1
CUFF TRNQT CYL 34X4.125X (TOURNIQUET CUFF) ×2 IMPLANT
DRAPE INCISE IOBAN 66X45 STRL (DRAPES) ×2 IMPLANT
DRAPE U-SHAPE 47X51 STRL (DRAPES) ×2 IMPLANT
DRSG AQUACEL AG ADV 3.5X10 (GAUZE/BANDAGES/DRESSINGS) ×2 IMPLANT
DURAPREP 26ML APPLICATOR (WOUND CARE) ×2 IMPLANT
ELECT REM PT RETURN 15FT ADLT (MISCELLANEOUS) ×2 IMPLANT
GLOVE BIOGEL PI IND STRL 8 (GLOVE) ×4 IMPLANT
GLOVE ECLIPSE 7.5 STRL STRAW (GLOVE) ×4 IMPLANT
GOWN STRL REUS W/ TWL XL LVL3 (GOWN DISPOSABLE) ×4 IMPLANT
GOWN STRL REUS W/TWL XL LVL3 (GOWN DISPOSABLE) ×2
HANDPIECE INTERPULSE COAX TIP (DISPOSABLE) ×1
HOLDER FOLEY CATH W/STRAP (MISCELLANEOUS) IMPLANT
HOOD PEEL AWAY T7 (MISCELLANEOUS) ×6 IMPLANT
KIT TURNOVER KIT A (KITS) IMPLANT
MANIFOLD NEPTUNE II (INSTRUMENTS) ×2 IMPLANT
NDL HYPO 22X1.5 SAFETY MO (MISCELLANEOUS) ×4 IMPLANT
NEEDLE HYPO 22X1.5 SAFETY MO (MISCELLANEOUS) ×2 IMPLANT
NS IRRIG 1000ML POUR BTL (IV SOLUTION) ×2 IMPLANT
PACK TOTAL KNEE CUSTOM (KITS) ×2 IMPLANT
PADDING CAST ABS COTTON 6X4 NS (CAST SUPPLIES) IMPLANT
PADDING CAST COTTON 6X4 STRL (CAST SUPPLIES) ×2 IMPLANT
PATELLA MEDIAL ATTUN 35MM KNEE (Knees) IMPLANT
PIN STEINMAN FIXATION KNEE (PIN) IMPLANT
PROTECTOR NERVE ULNAR (MISCELLANEOUS) ×2 IMPLANT
SET HNDPC FAN SPRY TIP SCT (DISPOSABLE) ×2 IMPLANT
SPIKE FLUID TRANSFER (MISCELLANEOUS) ×4 IMPLANT
STRIP CLOSURE SKIN 1/2X4 (GAUZE/BANDAGES/DRESSINGS) IMPLANT
SUT MNCRL AB 3-0 PS2 18 (SUTURE) ×2 IMPLANT
SUT VIC AB 0 CT1 36 (SUTURE) ×2 IMPLANT
SUT VIC AB 1 CT1 36 (SUTURE) ×4 IMPLANT
SUT VIC AB 2-0 CT1 27 (SUTURE) ×1
SUT VIC AB 2-0 CT1 TAPERPNT 27 (SUTURE) ×2 IMPLANT
SUT VIC AB 3-0 PS2 18 (SUTURE) ×1
SUT VIC AB 3-0 PS2 18XBRD (SUTURE) IMPLANT
SYR CONTROL 10ML LL (SYRINGE) ×4 IMPLANT
TIBIAL BASE ROT PLAT SZ 5 KNEE (Knees) ×1 IMPLANT
TRAY FOLEY MTR SLVR 14FR STAT (SET/KITS/TRAYS/PACK) IMPLANT
TUBE SUCTION HIGH CAP CLEAR NV (SUCTIONS) ×2 IMPLANT
WATER STERILE IRR 1000ML POUR (IV SOLUTION) ×4 IMPLANT
WRAP KNEE MAXI GEL POST OP (GAUZE/BANDAGES/DRESSINGS) ×2 IMPLANT

## 2022-12-21 NOTE — Evaluation (Signed)
Physical Therapy Evaluation Patient Details Name: Tabitha Wilcox MRN: [id number] DOB: 12/22/1955 Today's Date: 12/21/2022  History of Present Illness  67 yo female presents to therapy s/p R TKA on 12/21/2022 due to failure of conservative measures. Pt PMH includes but is not limited to: spondylolisthesis of lumbosacral region s/p surgery, arthritis, asthma, GERD, esophageal stricture, B CTS, R RTC injury s/p repair, depression and L TKA.  Clinical Impression     Tabitha Wilcox is a 67 y.o. female POD 0 s/p R TKA. Patient reports IND with mobility at baseline. Patient is now limited by functional impairments (see PT problem list below) and requires CGA for supine to sit and min A for R LE for sit to supine for bed mobility. PT eval limited due to active bleeding of R knee surgiical incision line once seated EOB. Pt able to scoot laterally to the L for improved positioning in bed. Nursing notified and able to address. Patient instructed in exercise to facilitate ROM and circulation to manage edema initated with ankle DF/PF as able due to absent active R ankle DF prior to sitting EOB. Patient will benefit from continued skilled PT interventions to address impairments and progress towards PLOF. Acute PT will follow to progress mobility and stair training in preparation for safe discharge to daughters home with family support and HHPT services.       If plan is discharge home, recommend the following: A little help with walking and/or transfers;A little help with bathing/dressing/bathroom;Assistance with cooking/housework;Assist for transportation;Help with stairs or ramp for entrance   Can travel by private vehicle        Equipment Recommendations None recommended by PT  Recommendations for Other Services       Functional Status Assessment Patient has had a recent decline in their functional status and demonstrates the ability to make significant improvements in function in a reasonable and  predictable amount of time.     Precautions / Restrictions Precautions Precautions: Knee;Fall Restrictions Weight Bearing Restrictions: No      Mobility  Bed Mobility Overal bed mobility: Needs Assistance Bed Mobility: Supine to Sit, Sit to Supine     Supine to sit: Contact guard, HOB elevated, Used rails Sit to supine: Min assist (flat)   General bed mobility comments: min cues    Transfers                   General transfer comment: pt able to scoot laterally on EOB to L, once seated EOB noted active bleeding R knee incision line and pt assisted back to bed, nursing staff notified and dressing reinforced    Ambulation/Gait               General Gait Details: NT due to active R knee surgical incision line  Stairs            Wheelchair Mobility     Tilt Bed    Modified Rankin (Stroke Patients Only)       Balance                                             Pertinent Vitals/Pain Pain Assessment Pain Assessment: 0-10 Pain Score: 3  Pain Location: R Knee Pain Descriptors / Indicators: Aching, Discomfort, Operative site guarding Pain Intervention(s): Limited activity within patient's tolerance, Monitored during session, Premedicated before session, Repositioned, Ice applied  Home Living Family/patient expects to be discharged to:: Private residence Living Arrangements: Other relatives Available Help at Discharge: Family Type of Home: House Home Access: Stairs to enter Entrance Stairs-Rails: Right Entrance Stairs-Number of Steps: 3   Home Layout: Two level;Able to live on main level with bedroom/bathroom Home Equipment: Rolling Walker (2 wheels);Rollator (4 wheels);Cane - single point;Shower seat Additional Comments: pt to d/c to daughters home and will be all on one level    Prior Function Prior Level of Function : Independent/Modified Independent             Mobility Comments: IND no AD for all ADLs, self  care tasks and IADLs       Extremity/Trunk Assessment        Lower Extremity Assessment Lower Extremity Assessment: RLE deficits/detail RLE Deficits / Details: ankle DF absent-0/5 pt able to achive neutral with AA and unable to maintain DF with PROM new onset since surgery per pt report, PF 5/5; SLR < 10 degree lag RLE Sensation: WNL    Cervical / Trunk Assessment Cervical / Trunk Assessment: Normal  Communication   Communication Communication: No apparent difficulties  Cognition Arousal: Alert Behavior During Therapy: WFL for tasks assessed/performed Overall Cognitive Status: Within Functional Limits for tasks assessed                                          General Comments      Exercises Total Joint Exercises Ankle Circles/Pumps: AROM, Both, 10 reps   Assessment/Plan    PT Assessment Patient needs continued PT services  PT Problem List Decreased strength;Decreased activity tolerance;Decreased range of motion;Decreased balance;Decreased mobility;Decreased coordination;Pain       PT Treatment Interventions DME instruction;Gait training;Stair training;Functional mobility training;Therapeutic activities;Balance training;Therapeutic exercise;Neuromuscular re-education;Patient/family education;Modalities    PT Goals (Current goals can be found in the Care Plan section)  Acute Rehab PT Goals Patient Stated Goal: to be able to have R knee go as well as the L PT Goal Formulation: With patient Time For Goal Achievement: 01/04/23 Potential to Achieve Goals: Good    Frequency 7X/week     Co-evaluation               AM-PAC PT "6 Clicks" Mobility  Outcome Measure Help needed turning from your back to your side while in a flat bed without using bedrails?: A Little Help needed moving from lying on your back to sitting on the side of a flat bed without using bedrails?: A Little Help needed moving to and from a bed to a chair (including a  wheelchair)?: A Little Help needed standing up from a chair using your arms (e.g., wheelchair or bedside chair)?: Total Help needed to walk in hospital room?: Total Help needed climbing 3-5 steps with a railing? : Total 6 Click Score: 12    End of Session Equipment Utilized During Treatment: Gait belt Activity Tolerance: Treatment limited secondary to medical complications (Comment) (active bleeding R knee incision line)     PT Visit Diagnosis: Unsteadiness on feet (R26.81);Other abnormalities of gait and mobility (R26.89);Muscle weakness (generalized) (M62.81);Difficulty in walking, not elsewhere classified (R26.2);Pain Pain - Right/Left: Right Pain - part of body: Knee;Leg    Time: 7425-9563 PT Time Calculation (min) (ACUTE ONLY): 16 min   Charges:   PT Evaluation $PT Eval Low Complexity: 1 Low   PT General Charges $$ ACUTE PT VISIT: 1 Visit  Johnny Bridge, PT Acute Rehab   Jacqualyn Posey 12/21/2022, 7:31 PM

## 2022-12-21 NOTE — Progress Notes (Signed)
Orthopedic Tech Progress Note Patient Details:  Tabitha Wilcox Apr 20, 1955 [id number]  Ortho Devices Type of Ortho Device: Bone foam zero knee Ortho Device/Splint Interventions: Ordered      [place] 12/21/2022, 1:37 PM

## 2022-12-21 NOTE — Discharge Instructions (Signed)

## 2022-12-21 NOTE — Anesthesia Postprocedure Evaluation (Signed)
Anesthesia Post Note  Patient: Tabitha Wilcox  Procedure(s) Performed: TOTAL KNEE ARTHROPLASTY (Right: Knee)     Patient location during evaluation: PACU Anesthesia Type: Regional and MAC Level of consciousness: oriented and awake and alert Pain management: pain level controlled Vital Signs Assessment: post-procedure vital signs reviewed and stable Respiratory status: spontaneous breathing, respiratory function stable and patient connected to nasal cannula oxygen Cardiovascular status: blood pressure returned to baseline and stable Postop Assessment: no headache, no backache and no apparent nausea or vomiting Anesthetic complications: no   No notable events documented.  Last Vitals:  Vitals:   12/21/22 1345 12/21/22 1400  BP: 127/79 116/69  Pulse: 84 89  Resp: 17 (!) 22  Temp:    SpO2: 93% 93%    Last Pain:  Vitals:   12/21/22 1400  TempSrc:   PainSc: 0-No pain                 Avynn Klassen

## 2022-12-21 NOTE — Transfer of Care (Signed)
Immediate Anesthesia Transfer of Care Note  Patient: Tabitha Wilcox  Procedure(s) Performed: TOTAL KNEE ARTHROPLASTY (Right: Knee)  Patient Location: PACU  Anesthesia Type:Spinal  Level of Consciousness: awake, alert , oriented, and patient cooperative  Airway & Oxygen Therapy: Patient Spontanous Breathing and Patient connected to face mask oxygen  Post-op Assessment: Report given to RN and Post -op Vital signs reviewed and stable  Post vital signs: Reviewed and stable  Last Vitals:  Vitals Value Taken Time  BP 111/69 12/21/22 1323  Temp    Pulse 92 12/21/22 1324  Resp 17 12/21/22 1324  SpO2 100 % 12/21/22 1324  Vitals shown include unfiled device data.  Last Pain:  Vitals:   12/21/22 0910  TempSrc: Oral         Complications: No notable events documented.

## 2022-12-21 NOTE — Anesthesia Procedure Notes (Signed)
Spinal  Patient location during procedure: OR Start time: 12/21/2022 11:10 AM End time: 12/21/2022 11:14 AM Reason for block: surgical anesthesia Staffing Performed: resident/CRNA  Resident/CRNA: Garth Bigness, CRNA Performed by: Garth Bigness, CRNA Authorized by: Bethena Midget, MD   Preanesthetic Checklist Completed: patient identified, IV checked, site marked, risks and benefits discussed, surgical consent, monitors and equipment checked, pre-op evaluation and timeout performed Spinal Block Patient position: sitting Prep: Betadine Patient monitoring: heart rate, continuous pulse ox and blood pressure Approach: midline Location: L3-4 Injection technique: single-shot Needle Needle type: Pencan  Needle gauge: 24 G Needle length: 9 cm Needle insertion depth: 6 cm Assessment Sensory level: T10 Events: CSF return Additional Notes

## 2022-12-21 NOTE — Plan of Care (Signed)
Problem: Education: Goal: Knowledge of the prescribed therapeutic regimen will improve Outcome: Progressing   Problem: Activity: Goal: Ability to avoid complications of mobility impairment will improve Outcome: Progressing   Problem: Pain Management: Goal: Pain level will decrease with appropriate interventions Outcome: Progressing   Problem: Skin Integrity: Goal: Will show signs of wound healing Outcome: Progressing   Problem: Education: Goal: Knowledge of General Education information will improve Description: Including pain rating scale, medication(s)/side effects and non-pharmacologic comfort measures Outcome: Progressing   Problem: Activity: Goal: Risk for activity intolerance will decrease Outcome: Progressing   Problem: Nutrition: Goal: Adequate nutrition will be maintained Outcome: Progressing   Problem: Elimination: Goal: Will not experience complications related to bowel motility Outcome: Progressing   Problem: Pain Managment: Goal: General experience of comfort will improve Outcome: Progressing

## 2022-12-21 NOTE — Anesthesia Procedure Notes (Signed)
Anesthesia Regional Block: Adductor canal block   Pre-Anesthetic Checklist: , timeout performed,  Correct Patient, Correct Site, Correct Laterality,  Correct Procedure, Correct Position, site marked,  Risks and benefits discussed,  Surgical consent,  Pre-op evaluation,  At surgeon's request and post-op pain management  Laterality: Right  Prep: chloraprep       Needles:  Injection technique: Single-shot  Needle Type: Echogenic Stimulator Needle     Needle Length: 5cm  Needle Gauge: 22     Additional Needles:   Procedures:,,,, ultrasound used (permanent image in chart),,    Narrative:  Start time: 12/21/2022 9:10 AM End time: 12/21/2022 9:15 AM Injection made incrementally with aspirations every 5 mL.  Performed by: Personally  Anesthesiologist: Bethena Midget, MD  Additional Notes: Functioning IV was confirmed and monitors were applied.  A 50mm 22ga Arrow echogenic stimulator needle was used. Sterile prep and drape,hand hygiene and sterile gloves were used. Ultrasound guidance: relevant anatomy identified, needle position confirmed, local anesthetic spread visualized around nerve(s)., vascular puncture avoided.  Image printed for medical record. Negative aspiration and negative test dose prior to incremental administration of local anesthetic. The patient tolerated the procedure well.

## 2022-12-21 NOTE — Op Note (Signed)
PATIENT ID:      Tabitha Wilcox  MRN:     [id number] DOB/AGE:    05/15/1955 / 67 y.o.       OPERATIVE REPORT   DATE OF PROCEDURE:  12/21/2022      PREOPERATIVE DIAGNOSIS:   RIGHT KNEE DEGENERATIVE JOINT DISEASE      Estimated body mass index is 34.39 kg/m as calculated from the following:   Height as of this encounter: 5\' 1"  (1.549 m).   Weight as of this encounter: 82.6 kg.                                                       POSTOPERATIVE DIAGNOSIS:   Same                                                                  PROCEDURE:  Procedure(s): TOTAL KNEE ARTHROPLASTY Using DepuyAttune RP implants #4 Femur, #5Tibia, 10 mm Attune RP bearing, 35 Patella    SURGEON: Harvie Junior  ASSISTANT: kristen Schrader OPA-C   (Present and scrubbed throughout the case, critical for assistance with exposure, retraction, instrumentation, and closure.)        ANESTHESIA: spinal, 20cc Exparel, 50cc 0.25% Marcaine EBL: min cc FLUID REPLACEMENT: unk cc crystaloid TOURNIQUET: DRAINS: None TRANEXAMIC ACID: 1gm IV, 2gm topical COMPLICATIONS:  None         INDICATIONS FOR PROCEDURE: The patient has  RIGHT KNEE DEGENERATIVE JOINT DISEASE, mild varus deformities, XR shows bone on bone arthritis, lateral subluxation of tibia. Patient has failed all conservative measures including anti-inflammatory medicines, narcotics, attempts at exercise and weight loss, cortisone injections and viscosupplementation.  Risks and benefits of surgery have been discussed, questions answered.   DESCRIPTION OF PROCEDURE: The patient identified by armband, received  IV antibiotics, in the holding area at [facility]. Patient taken to the operating room, appropriate anesthetic monitors were attached, and spinal anesthesia was  induced. IV Tranexamic acid was given. Lateral post and 2 surefoot positioners applied to the table, the lower extremity was then prepped and draped in usual sterile fashion from the toes to  the high thigh. Time-out procedure was performed. Dia Sitter OPAC, was present and scrubbed throughout the case, critical for assistance with, positioning, exposure, retraction, instrumentation, and closure.The skin and subcutaneous tissue along the incision was injected with 20 cc of a mixture of 20cc Exparel and 30cc Marcaine 50cc saline solution, using a 21-gauge by 1-1/2 inch needle. We began the operation, with the knee flexed 130 degrees, by making the anterior midline incision starting at handbreadth above the patella going over the patella 1 cm medial to and 4 cm distal to the tibial tubercle. Small bleeders in the skin and the subcutaneous tissue identified and cauterized. Transverse retinaculum was incised and reflected medially and a medial parapatellar arthrotomy was accomplished. the patella was everted and theprepatellar fat pad resected. The superficial medial collateral ligament was then elevated from anterior to posterior along the proximal flare of the tibia and anterior half of the menisci resected. The knee was hyperflexed exposing bone on bone arthritis.  Peripheral and notch osteophytes as well as the cruciate ligaments were then resected. We continued to work our way around posteriorly along the proximal tibia, and externally rotated the tibia subluxing it out from underneath the femur. A McHale PCL retractor was placed through the notch, a lateral Hohmann retractor, and anterolateral small homan retractor placed. We then entered the proximal tibia with the Depuy starter drill in line with the axis of the tibia followed by an intramedullary guide rod and 3-degree posterior slope cutting guide. The tibial cutting guide, was pinned into place allowing resection of 2mm of bone medially and 10 mm of bone laterally. Satisfied with the tibial resection, we then entered the distal femur 2 mm anterior to the PCL origin with the starter drill, followed by the intramedullary guide rod and applied  the distal femoral cutting guide set at 9 mm, with 5 degrees of valgus. This was pinned along the epicondylar axis. At this point, the distal femoral cut was accomplished without difficulty. We then sized for a #4 femoral component and pinned the chamfer guide in 3 degrees of external rotation. The anterior, posterior, and chamfer cuts were accomplished without difficulty followed by the Attune RP box cutting guide and the box cut. We also removed posterior osteophytes from the posterior femoral condyles. The posterior capsule was injected with Exparel solution. The knee was brought into full extension. We checked our extension gap and fit a 10 mm trial lollipop. Distracting in extension with a lamina spreader,  bleeders in the posterior capsule, Posterior medial and posterior lateral gutter were cauterized.  The transexamic acid-soaked sponge was then placed in the gap of the knee in extension. The knee was flexed 30. The posterior patella cut was accomplished with the 9.5 mm Attune cutting guide, sized for a 35 mm dome, and the fixation pegs drilled.The knee was then once again hyperflexed exposing the proximal tibia. We sized for a # 5 tibial base plate, applied the smokestack and the conical reamer followed by the the Delta fin keel punch. We then hammered into place the Attune RP trial femoral component, drilled the lugs, inserted a  10 mm trial bearing, trial patellar button, and took the knee through range of motion from 0-130 degrees. Medial and lateral ligamentous stability was checked. No thumb pressure was required for patellar Tracking.  All trial components were removed, mating surfaces irrigated with pulse lavage, and dried with suction and sponges. 10 cc of the Exparel solution was applied to the cancellus bone of the patella distal femur and proximal tibia.  After waiting 30 seconds, the bony surfaces were again, dried with sponges. A double batch of DePuy HV cement was mixed and applied to all bony  metallic mating surfaces except for the posterior condyles of the femur itself. In order, we hammered into place the tibial tray and removed excess cement, the femoral component and removed excess cement. The final Attune RP bearing was inserted, and the knee brought to full extension with compression. The patellar button was clamped into place, and excess cement removed. The knee was held at 30 flexion with compression using the second surefoot, while the cement cured. The wound was irrigated out with normal saline solution pulse lavage. The rest of the Exparel was injected into the parapatellar arthrotomy, subcutaneous tissues, and periosteal tissues. The parapatellar arthrotomy was closed with running #1 Vicryl suture. The subcutaneous tissue with 3-0 undyed Vicryl suture, and the skin with running 3-0 SQ vicryl. An Aquacil dressing and Ace wrap  were applied. The patient was taken to recovery room without difficulty.   Harvie Junior 12/21/2022, 12:46 PM

## 2022-12-21 NOTE — Anesthesia Preprocedure Evaluation (Addendum)
Anesthesia Evaluation  Patient identified by MRN, date of birth, ID band Patient awake    Reviewed: Allergy & Precautions, NPO status , Patient's Chart, lab work & pertinent test results  Airway Mallampati: II  TM Distance: >3 FB Neck ROM: Full    Dental no notable dental hx. (+) Edentulous Upper, Upper Dentures, Dental Advisory Given,    Pulmonary shortness of breath, asthma , former smoker   Pulmonary exam normal breath sounds clear to auscultation       Cardiovascular negative cardio ROS Normal cardiovascular exam Rhythm:Regular Rate:Normal     Neuro/Psych  PSYCHIATRIC DISORDERS Anxiety Depression     Neuromuscular disease negative neurological ROS  negative psych ROS   GI/Hepatic ,GERD  Controlled,,(+) Hepatitis -  Endo/Other    Morbid obesity  Renal/GU negative Renal ROS  negative genitourinary   Musculoskeletal negative musculoskeletal ROS (+) Arthritis ,    Abdominal   Peds negative pediatric ROS (+)  Hematology negative hematology ROS (+)   Anesthesia Other Findings   Reproductive/Obstetrics negative OB ROS                             Anesthesia Physical Anesthesia Plan  ASA: 2  Anesthesia Plan: Spinal, Regional and MAC   Post-op Pain Management:  Regional for Post-op pain and Regional block*   Induction:   PONV Risk Score and Plan: 1 and Ondansetron, Treatment may vary due to age or medical condition, Propofol infusion and Midazolam  Airway Management Planned: Simple Face Mask, Natural Airway and Nasal Cannula  Additional Equipment: None  Intra-op Plan:   Post-operative Plan:   Informed Consent: I have reviewed the patients History and Physical, chart, labs and discussed the procedure including the risks, benefits and alternatives for the proposed anesthesia with the patient or authorized representative who has indicated his/her understanding and acceptance.      Dental advisory given  Plan Discussed with: CRNA and Anesthesiologist  Anesthesia Plan Comments:         Anesthesia Quick Evaluation

## 2022-12-22 ENCOUNTER — Encounter (HOSPITAL_COMMUNITY): Payer: Self-pay | Admitting: Orthopedic Surgery

## 2022-12-22 DIAGNOSIS — M1711 Unilateral primary osteoarthritis, right knee: Secondary | ICD-10-CM | POA: Diagnosis not present

## 2022-12-22 NOTE — Care Management Obs Status (Signed)
MEDICARE OBSERVATION STATUS NOTIFICATION   Patient Details  Name: Tabitha Wilcox MRN: [id number] Date of Birth: Aug 12, 1955   Medicare Observation Status Notification Given:  Yes    Amada Jupiter, LCSW 12/22/2022, 10:40 AM

## 2022-12-22 NOTE — TOC Transition Note (Signed)
Transition of Care [facility]) - CM/SW Discharge Note   Patient Details  Name: Tabitha Wilcox MRN: [id number] Date of Birth: [birth year]  [facility] CM/SW Contact:  Amada Jupiter, LCSW Phone Number: 12/22/2022, 9:53 AM   Clinical Narrative:     Met with pt who confirms she has needed DME in the home.  HHPT prearranged with [facility] via MD office.  No further TOC needs.  Final next level of care: Home w Home Health Services Barriers to Discharge: No Barriers Identified   Patient Goals and CMS Choice      Discharge Placement                         Discharge Plan and Services Additional resources added to the After Visit Summary for                  DME Arranged: N/A DME Agency: NA       HH Arranged: PT HH Agency: [facility]        Social Determinants of Health (SDOH) Interventions SDOH Screenings   Food Insecurity: No Food Insecurity (12/21/2022)  Housing: Low Risk  (12/21/2022)  Transportation Needs: No Transportation Needs (12/21/2022)  Utilities: Not At Risk (12/21/2022)  Social Connections: Unknown (07/22/2021)   Received from [facility]  Tobacco Use: Medium Risk (12/21/2022)     Readmission Risk Interventions     No data to display

## 2022-12-22 NOTE — Progress Notes (Addendum)
Physical Therapy Treatment Patient Details Name: GAILE ALLMON MRN: [id number] DOB: 10-27-1955 Today's Date: 12/22/2022   History of Present Illness 67 yo female presents to therapy s/p R TKA on 12/21/2022 due to failure of conservative measures. Pt PMH includes but is not limited to: spondylolisthesis of lumbosacral region s/p surgery, arthritis, asthma, GERD, esophageal stricture, B CTS, R RTC injury s/p repair, depression and L TKA.    PT Comments  Pt agreeable to working with therapy. Moderate pain with activity. She reports R ankle weakness-able to PF but weak DF. Pt reports she is planning to remain in hospital for another night-per pt, has discussed this with surgical team already. Will continue to follow and progress activity as tolerated.    If plan is discharge home, recommend the following: A little help with walking and/or transfers;A little help with bathing/dressing/bathroom;Assistance with cooking/housework;Assist for transportation;Help with stairs or ramp for entrance   Can travel by private vehicle        Equipment Recommendations  None recommended by PT    Recommendations for Other Services       Precautions / Restrictions Precautions Precautions: Knee;Fall Restrictions Weight Bearing Restrictions: No     Mobility  Bed Mobility Overal bed mobility: Needs Assistance Bed Mobility: Supine to Sit     Supine to sit: Contact guard Sit to supine: Min assist   General bed mobility comments: Increased time. Cues provided as needed. Assist for R LE back onto bed.    Transfers Overall transfer level: Needs assistance Equipment used: Rolling walker (2 wheels) Transfers: Sit to/from Stand Sit to Stand: Contact guard assist, From elevated surface           General transfer comment: Cues for safety, technique, hand/LE placement. Increased time.    Ambulation/Gait Ambulation/Gait assistance: Contact guard assist Gait Distance (Feet): 60 Feet Assistive  device: Rolling walker (2 wheels) Gait Pattern/deviations: Step-to pattern       General Gait Details: Cues for safety, sequencing. Several brief standing rest breaks taken 2* fatigue, pain. Slow gait speed. Pt denied dizziness.   Stairs             Wheelchair Mobility     Tilt Bed    Modified Rankin (Stroke Patients Only)       Balance Overall balance assessment: Needs assistance         Standing balance support: Reliant on assistive device for balance, Bilateral upper extremity supported, During functional activity Standing balance-Leahy Scale: Poor                              Cognition Arousal: Alert Behavior During Therapy: WFL for tasks assessed/performed Overall Cognitive Status: Within Functional Limits for tasks assessed                                          Exercises Total Joint Exercises Ankle Circles/Pumps: AROM, Both, 10 reps Quad Sets: AROM, Right, 10 reps Straight Leg Raises: AROM, Right, 10 reps    General Comments        Pertinent Vitals/Pain Pain Assessment Pain Assessment: Faces Faces Pain Scale: Hurts even more Pain Location: R knee Pain Descriptors / Indicators: Aching, Operative site guarding Pain Intervention(s): Limited activity within patient's tolerance, Ice applied, Monitored during session, Repositioned, Premedicated before session    Home Living  Prior Function            PT Goals (current goals can now be found in the care plan section) Progress towards PT goals: Progressing toward goals    Frequency    7X/week      PT Plan      Co-evaluation              AM-PAC PT "6 Clicks" Mobility   Outcome Measure  Help needed turning from your back to your side while in a flat bed without using bedrails?: A Little Help needed moving from lying on your back to sitting on the side of a flat bed without using bedrails?: A Little Help needed  moving to and from a bed to a chair (including a wheelchair)?: A Little Help needed standing up from a chair using your arms (e.g., wheelchair or bedside chair)?: A Little Help needed to walk in hospital room?: A Little Help needed climbing 3-5 steps with a railing? : A Lot 6 Click Score: 17    End of Session Equipment Utilized During Treatment: Gait belt Activity Tolerance: Patient limited by fatigue;Patient limited by pain Patient left: in bed;with call bell/phone within reach;with bed alarm set   PT Visit Diagnosis: Unsteadiness on feet (R26.81);Other abnormalities of gait and mobility (R26.89);Muscle weakness (generalized) (M62.81);Difficulty in walking, not elsewhere classified (R26.2);Pain Pain - Right/Left: Right Pain - part of body: Knee     Time: 1002-1024 PT Time Calculation (min) (ACUTE ONLY): 22 min  Charges:    $Gait Training: 8-22 mins PT General Charges $$ ACUTE PT VISIT: 1 Visit                         Faye Ramsay, PT Acute Rehabilitation  Office: [phone]

## 2022-12-22 NOTE — Plan of Care (Signed)
  Problem: Education: Goal: Knowledge of the prescribed therapeutic regimen will improve Outcome: Progressing Goal: Individualized Educational Video(s) Outcome: Completed/Met   Problem: Activity: Goal: Ability to avoid complications of mobility impairment will improve Outcome: Progressing Goal: Range of joint motion will improve Outcome: Progressing   Problem: Clinical Measurements: Goal: Postoperative complications will be avoided or minimized Outcome: Progressing   Problem: Pain Management: Goal: Pain level will decrease with appropriate interventions Outcome: Adequate for Discharge   Problem: Skin Integrity: Goal: Will show signs of wound healing Outcome: Progressing   Problem: Education: Goal: Knowledge of General Education information will improve Description: Including pain rating scale, medication(s)/side effects and non-pharmacologic comfort measures Outcome: Progressing   Problem: Health Behavior/Discharge Planning: Goal: Ability to manage health-related needs will improve Outcome: Adequate for Discharge   Problem: Health Behavior/Discharge Planning: Goal: Ability to manage health-related needs will improve Outcome: Adequate for Discharge   Problem: Clinical Measurements: Goal: Ability to maintain clinical measurements within normal limits will improve Outcome: Progressing Goal: Will remain free from infection Outcome: Progressing Goal: Diagnostic test results will improve Outcome: Progressing Goal: Respiratory complications will improve Outcome: Progressing Goal: Cardiovascular complication will be avoided Outcome: Progressing   Problem: Activity: Goal: Risk for activity intolerance will decrease Outcome: Adequate for Discharge   Problem: Nutrition: Goal: Adequate nutrition will be maintained Outcome: Completed/Met   Problem: Coping: Goal: Level of anxiety will decrease Outcome: Progressing   Problem: Elimination: Goal: Will not experience  complications related to bowel motility Outcome: Progressing Goal: Will not experience complications related to urinary retention Outcome: Progressing   Problem: Pain Managment: Goal: General experience of comfort will improve Outcome: Progressing   Problem: Safety: Goal: Ability to remain free from injury will improve Outcome: Progressing

## 2022-12-22 NOTE — Progress Notes (Signed)
Physical Therapy Treatment Patient Details Name: Tabitha Wilcox MRN: [id number] DOB: 1955/08/20 Today's Date: 12/22/2022   History of Present Illness 67 yo female presents to therapy s/p R TKA on 12/21/2022 due to failure of conservative measures. Pt PMH includes but is not limited to: spondylolisthesis of lumbosacral region s/p surgery, arthritis, asthma, GERD, esophageal stricture, B CTS, R RTC injury s/p repair, depression and L TKA.    PT Comments  Pt pre-medicated prior to session. She tolerated increased activity well. She continues to report numbness and weakness in her R foot. Will continue to progress activity as tolerated.  Pt states "Im not going home with my foot like this." Encouraged continued discussions with surgical team.    If plan is discharge home, recommend the following: A little help with walking and/or transfers;A little help with bathing/dressing/bathroom;Assistance with cooking/housework;Assist for transportation;Help with stairs or ramp for entrance   Can travel by private vehicle        Equipment Recommendations  None recommended by PT    Recommendations for Other Services       Precautions / Restrictions Precautions Precautions: Knee;Fall Restrictions Weight Bearing Restrictions: No Other Position/Activity Restrictions: WBAT     Mobility  Bed Mobility Overal bed mobility: Needs Assistance Bed Mobility: Supine to Sit, Sit to Supine     Supine to sit: Contact guard, HOB elevated Sit to supine: Contact guard assist, HOB elevated   General bed mobility comments: Increased time. Cues provided as needed.    Transfers Overall transfer level: Needs assistance Equipment used: Rolling walker (2 wheels) Transfers: Sit to/from Stand Sit to Stand: Contact guard assist, From elevated surface           General transfer comment: Cues for safety, technique, hand/LE placement. Increased time.    Ambulation/Gait Ambulation/Gait assistance: Contact  guard assist Gait Distance (Feet): 200 Feet Assistive device: Rolling walker (2 wheels) Gait Pattern/deviations: Decreased stride length       General Gait Details: Cues for safety, sequencing. Fair gait speed. Pt insisted on ambulating further this afternoon.   Stairs             Wheelchair Mobility     Tilt Bed    Modified Rankin (Stroke Patients Only)       Balance Overall balance assessment: Needs assistance         Standing balance support: Reliant on assistive device for balance, Bilateral upper extremity supported, During functional activity Standing balance-Leahy Scale: Fair                              Cognition Arousal: Alert Behavior During Therapy: WFL for tasks assessed/performed Overall Cognitive Status: Within Functional Limits for tasks assessed                                          Exercises Total Joint Exercises Ankle Circles/Pumps: AROM, Both, 10 reps Quad Sets: AROM, Right, 10 reps Straight Leg Raises: AROM, Right, 10 reps    General Comments        Pertinent Vitals/Pain Pain Assessment Pain Assessment: Faces Faces Pain Scale: Hurts even more Pain Location: R knee Pain Descriptors / Indicators: Aching, Operative site guarding Pain Intervention(s): Limited activity within patient's tolerance, Monitored during session, Repositioned, Ice applied    Home Living  Prior Function            PT Goals (current goals can now be found in the care plan section) Progress towards PT goals: Progressing toward goals    Frequency    7X/week      PT Plan      Co-evaluation              AM-PAC PT "6 Clicks" Mobility   Outcome Measure  Help needed turning from your back to your side while in a flat bed without using bedrails?: A Little Help needed moving from lying on your back to sitting on the side of a flat bed without using bedrails?: A Little Help  needed moving to and from a bed to a chair (including a wheelchair)?: A Little Help needed standing up from a chair using your arms (e.g., wheelchair or bedside chair)?: A Little Help needed to walk in hospital room?: A Little Help needed climbing 3-5 steps with a railing? : A Little 6 Click Score: 18    End of Session Equipment Utilized During Treatment: Gait belt Activity Tolerance: Patient tolerated treatment well Patient left: in bed;with call bell/phone within reach;with bed alarm set   PT Visit Diagnosis: Unsteadiness on feet (R26.81);Other abnormalities of gait and mobility (R26.89);Muscle weakness (generalized) (M62.81);Difficulty in walking, not elsewhere classified (R26.2);Pain Pain - Right/Left: Right Pain - part of body: Knee     Time: 1440-1501 PT Time Calculation (min) (ACUTE ONLY): 21 min  Charges:    $Gait Training: 8-22 mins PT General Charges $$ ACUTE PT VISIT: 1 Visit                         Faye Ramsay, PT Acute Rehabilitation  Office: [phone]

## 2022-12-22 NOTE — Progress Notes (Signed)
Subjective: 1 Day Post-Op Procedure(s) (LRB): TOTAL KNEE ARTHROPLASTY (Right) Patient reports pain as mild. The patient reports that her lower leg is numb.  Taking by mouth and voiding okay. She bled through her total knee dressing.  Denies dizziness or shortness of breath.  Objective: Vital signs in last 24 hours: Temp:  [97 F (36.1 C)-98 F (36.7 C)] 97.5 F (36.4 C) (10/15 0614) Pulse Rate:  [78-96] 94 (10/15 0147) Resp:  [8-27] 17 (10/15 0614) BP: (111-142)/(69-104) 136/74 (10/15 0614) SpO2:  [93 %-100 %] 99 % (10/15 0614)  Intake/Output from previous day: 10/14 0701 - 10/15 0700 In: 2290.8 [P.O.:960; I.V.:1030.8; IV Piggyback:300] Out: 3329 [JJOAC:1660; Blood:50] Intake/Output this shift: Total I/O In: 240 [P.O.:240] Out: -   No results for input(s): "HGB" in the last 72 hours. No results for input(s): "WBC", "RBC", "HCT", "PLT" in the last 72 hours. No results for input(s): "NA", "K", "CL", "CO2", "BUN", "CREATININE", "GLUCOSE", "CALCIUM" in the last 72 hours. No results for input(s): "LABPT", "INR" in the last 72 hours. Right lower extremity exam:she has bled through her Aquasol dressing.  There is no active drainage after the dressing was removed.  Her calf is soft and nontender.  She has a flicker of EHL strength but is unable to actively dorsiflex her foot.  She has some decreased sensation to soft touch in the dorsum of the foot. easilydoes a straight leg raise.   Assessment/Plan: 1 Day Post-Op Procedure(s) (LRB): TOTAL KNEE ARTHROPLASTY (Right) Plan: More than likely her dorsiflexor weakness is transient and probably from some Flexeril that leaked around the peroneal nerve. Today I changed her right knee dressing and we replaced her aqua cell dressing.  We will wrap and Ace wrap around that.  She will get up with physical therapy today and see how she does.  I anticipate that the dorsiflexor weakness will resolve once the extra wears off. Okay to get up with  physical therapy in the meantime. If she does better later today we can discharge her home.  Continue on aspirin 81 mg b.I.d. for DVT prophylaxis.    Matthew Folks 12/22/2022, 8:55 AM

## 2022-12-23 ENCOUNTER — Other Ambulatory Visit: Payer: Self-pay

## 2022-12-23 DIAGNOSIS — M21371 Foot drop, right foot: Secondary | ICD-10-CM | POA: Diagnosis not present

## 2022-12-23 DIAGNOSIS — M1711 Unilateral primary osteoarthritis, right knee: Secondary | ICD-10-CM | POA: Diagnosis not present

## 2022-12-23 NOTE — Progress Notes (Signed)
Physical Therapy Treatment Patient Details Name: Tabitha Wilcox MRN: [id number] DOB: Aug 27, 1955 Today's Date: 12/23/2022   History of Present Illness 67 yo female presents to therapy s/p R TKA on 12/21/2022 due to failure of conservative measures. Pt PMH includes but is not limited to: spondylolisthesis of lumbosacral region s/p surgery, arthritis, asthma, GERD, esophageal stricture, B CTS, R RTC injury s/p repair, depression and L TKA.    PT Comments  Pt agreeable to working with therapy. Reports moderate pain with activity. Reviewed/practiced exercises, gait training, and stair training. All PT education completed. Plan is for HHPT f/u.    If plan is discharge home, recommend the following: A little help with walking and/or transfers;A little help with bathing/dressing/bathroom;Assistance with cooking/housework;Assist for transportation;Help with stairs or ramp for entrance   Can travel by private vehicle        Equipment Recommendations  None recommended by PT    Recommendations for Other Services       Precautions / Restrictions Precautions Precautions: Knee;Fall Restrictions Weight Bearing Restrictions: No Other Position/Activity Restrictions: WBAT     Mobility  Bed Mobility Overal bed mobility: Needs Assistance Bed Mobility: Supine to Sit, Sit to Supine     Supine to sit: Supervision, HOB elevated Sit to supine: Supervision, HOB elevated   General bed mobility comments: Increased time. Cues provided as needed.    Transfers Overall transfer level: Needs assistance Equipment used: Rolling walker (2 wheels) Transfers: Sit to/from Stand Sit to Stand: Supervision           General transfer comment: Cues for safety, technique, hand/LE placement.    Ambulation/Gait Ambulation/Gait assistance: Supervision Gait Distance (Feet): 200 Feet Assistive device: Rolling walker (2 wheels) Gait Pattern/deviations: Decreased stride length       General Gait Details:  Fair gait speed. Tolerated distance well.   Stairs Stairs: Yes Stairs assistance: Contact guard assist Stair Management: Step to pattern, Forwards, Two rails Number of Stairs: 2 General stair comments: up and over portable stairs x 1. cues for safety, technique, sequencing.   Wheelchair Mobility     Tilt Bed    Modified Rankin (Stroke Patients Only)       Balance Overall balance assessment: Needs assistance         Standing balance support: Reliant on assistive device for balance, Bilateral upper extremity supported, During functional activity Standing balance-Leahy Scale: Fair                              Cognition Arousal: Alert Behavior During Therapy: WFL for tasks assessed/performed Overall Cognitive Status: Within Functional Limits for tasks assessed                                          Exercises Total Joint Exercises Long Arc Quad: AROM, Right, 10 reps, Seated Knee Flexion: AROM, Right, 10 reps, Seated Goniometric ROM: ~10-70 degrees    General Comments        Pertinent Vitals/Pain Pain Assessment Pain Assessment: Faces Faces Pain Scale: Hurts even more Pain Location: R knee Pain Descriptors / Indicators: Aching, Operative site guarding Pain Intervention(s): Monitored during session, Ice applied    Home Living                          Prior Function  PT Goals (current goals can now be found in the care plan section) Progress towards PT goals: Progressing toward goals    Frequency    7X/week      PT Plan      Co-evaluation              AM-PAC PT "6 Clicks" Mobility   Outcome Measure  Help needed turning from your back to your side while in a flat bed without using bedrails?: None Help needed moving from lying on your back to sitting on the side of a flat bed without using bedrails?: None Help needed moving to and from a bed to a chair (including a wheelchair)?: A  Little Help needed standing up from a chair using your arms (e.g., wheelchair or bedside chair)?: A Little Help needed to walk in hospital room?: A Little Help needed climbing 3-5 steps with a railing? : A Little 6 Click Score: 20    End of Session Equipment Utilized During Treatment: Gait belt Activity Tolerance: Patient tolerated treatment well Patient left: in bed;with call bell/phone within reach;with bed alarm set   PT Visit Diagnosis: Unsteadiness on feet (R26.81);Other abnormalities of gait and mobility (R26.89);Muscle weakness (generalized) (M62.81);Difficulty in walking, not elsewhere classified (R26.2);Pain Pain - Right/Left: Right Pain - part of body: Knee     Time: 6387-5643 PT Time Calculation (min) (ACUTE ONLY): 16 min  Charges:    $Gait Training: 8-22 mins PT General Charges $$ ACUTE PT VISIT: 1 Visit                         Faye Ramsay, PT Acute Rehabilitation  Office: [phone]

## 2022-12-23 NOTE — Progress Notes (Signed)
Subjective: 2 Days Post-Op Procedure(s) (LRB): TOTAL KNEE ARTHROPLASTY (Right) Patient reports pain as moderate.  Right knee pain has worsened since the Exparel nerve block is worn off.  She now is able to dorsiflex her right foot without difficulty and no longer has numbness below her knee.  Ambulated 200 feet with physical therapy.  She is taking by mouth and voiding well.  Objective: Vital signs in last 24 hours: Temp:  [97.4 F (36.3 C)-98.8 F (37.1 C)] 97.6 F (36.4 C) (10/16 0608) Pulse Rate:  [105-114] 109 (10/16 0118) Resp:  [17-18] 17 (10/16 0608) BP: (115-148)/(71-78) 115/73 (10/16 0608) SpO2:  [95 %-100 %] 97 % (10/16 0608)  Intake/Output from previous day: 10/15 0701 - 10/16 0700 In: 720 [P.O.:720] Out: 520 [Urine:520] Intake/Output this shift: Total I/O In: 240 [P.O.:240] Out: -   No results for input(s): "HGB" in the last 72 hours. No results for input(s): "WBC", "RBC", "HCT", "PLT" in the last 72 hours. No results for input(s): "NA", "K", "CL", "CO2", "BUN", "CREATININE", "GLUCOSE", "CALCIUM" in the last 72 hours. No results for input(s): "LABPT", "INR" in the last 72 hours. Right knee exam: Neurovascular intact Sensation intact distally Intact pulses distally Dorsiflexion/Plantar flexion intact Incision: no drainage No cellulitis present Compartment soft Excellent plantar and dorsiflexor strength of the right lower extremity.   Assessment/Plan: 2 Days Post-Op Procedure(s) (LRB): TOTAL KNEE ARTHROPLASTY (Right) Transient foot drop secondary to leakage of Exparel around peroneal nerve.  Completely resolved. Plan: Discharge home.  With home health PT. Aspirin 81 mg twice daily x 1 month postop for DVT prophylaxis. Pain meds and muscle relaxers have been sent in as well as Colace for constipation. Weight bear as tolerated on the right lower extremity. Follow-up with Dr. Luiz Blare in 2 weeks.   Anticipated LOS equal to or greater than 2 midnights due to -  Age 66 and older with one or more of the following:  - Obesity  - Expected need for hospital services (PT, OT, Nursing) required for safe  discharge    - OR   - Unanticipated findings during/Post Surgery: Transient right foot drop.  Resolved on postop day #2      Matthew Folks 12/23/2022, 12:59 PM

## 2022-12-23 NOTE — Discharge Summary (Signed)
Patient ID: Tabitha Wilcox MRN: [id number] DOB/AGE: 08/09/1955 67 y.o.  Admit date: 12/21/2022 Discharge date: 12/23/2022  Admission Diagnoses:  Principal Problem:   Arthritis of right knee Active Problems:   Foot drop, right   Discharge Diagnoses:  Same Resolved right foot drop. Past Medical History:  Diagnosis Date   Allergy    Anxiety    Arthritis    Asthma    and bronchitisl reprots her sob is due to her asthma    Carpal tunnel syndrome on both sides    Depression    GERD (gastroesophageal reflux disease)    Rotator cuff injury    Right side   Sciatica    Shortness of breath    sob with exertion    Tobacco abuse    Ulcer    1990's    Surgeries: Procedure(s): Right TOTAL KNEE ARTHROPLASTY on 12/21/2022   Consultants:   Discharged Condition: Improved  Hospital Course: Tabitha Wilcox is an 67 y.o. adult who was admitted 12/21/2022 for operative treatment ofArthritis of right knee. Patient has severe unremitting pain that affects sleep, daily activities, and work/hobbies. After pre-op clearance the patient was taken to the operating room on 12/21/2022 and underwent  Procedure(s): Right TOTAL KNEE ARTHROPLASTY.    Patient was given perioperative antibiotics:  Anti-infectives (From admission, onward)    Start     Dose/Rate Route Frequency Ordered Stop   12/21/22 0815  ceFAZolin (ANCEF) IVPB 2g/100 mL premix        2 g 200 mL/hr over 30 Minutes Intravenous On call to O.R. 12/21/22 0807 12/21/22 1115        Patient was given sequential compression devices, early ambulation, and chemoprophylaxis to prevent DVT.  Patient benefited maximally from hospital stay and there were no complications.  She did have a temporary right foot drop which was felt to be related to some Exparel injectable agent leaking around the peroneal nerve.  Once the Exparel wore off the foot drop completely resolved.  Patient ambulated 200 feet in the hall prior to discharge.  Recent vital  signs: Patient Vitals for the past 24 hrs:  BP Temp Temp src Pulse Resp SpO2  12/23/22 0608 115/73 97.6 F (36.4 C) Oral -- 17 97 %  12/23/22 0118 131/71 (!) 97.4 F (36.3 C) Oral (!) 109 17 95 %  12/22/22 2310 -- -- -- (!) 105 -- 99 %  12/22/22 2124 (!) 148/71 (!) 97.5 F (36.4 C) Oral -- 17 100 %  12/22/22 1334 138/78 98.8 F (37.1 C) Oral (!) 114 18 99 %     Recent laboratory studies: No results for input(s): "WBC", "HGB", "HCT", "PLT", "NA", "K", "CL", "CO2", "BUN", "CREATININE", "GLUCOSE", "INR", "CALCIUM" in the last 72 hours.  Invalid input(s): "PT", "2"   Discharge Medications:   Allergies as of 12/23/2022       Reactions   Hydrocodone Itching   Oxycodone Itching        Medication List     STOP taking these medications    meloxicam 15 MG tablet Commonly known as: MOBIC       TAKE these medications    albuterol (2.5 MG/3ML) 0.083% nebulizer solution Commonly known as: PROVENTIL Take 3 mLs (2.5 mg total) by nebulization every 6 (six) hours as needed for wheezing or shortness of breath.   albuterol 108 (90 Base) MCG/ACT inhaler Commonly known as: ProAir HFA Inhale 1-2 puffs into the lungs every 6 (six) hours as needed for wheezing or shortness of  breath.   aspirin EC 81 MG tablet Take 1 tablet (81 mg total) by mouth 2 (two) times daily.   CALCIUM 600+D PO Take 1 tablet by mouth daily.   celecoxib 200 MG capsule Commonly known as: CELEBREX Take 200 mg by mouth 2 (two) times daily.   cyclobenzaprine 10 MG tablet Commonly known as: FLEXERIL Take 10 mg by mouth daily.   diclofenac Sodium 1 % Gel Commonly known as: Voltaren Apply 2 g topically 4 (four) times daily. What changed:  when to take this reasons to take this   docusate sodium 100 MG capsule Commonly known as: Colace Take 1 capsule (100 mg total) by mouth 2 (two) times daily. What changed:  when to take this reasons to take this   gabapentin 600 MG tablet Commonly known as:  NEURONTIN Take 600 mg by mouth 2 (two) times daily.   HYDROmorphone 2 MG tablet Commonly known as: Dilaudid Take 1 tablet (2 mg total) by mouth every 4 (four) hours as needed for severe pain.   Krill Oil 350 MG Caps Take 350 mg by mouth daily.   latanoprost 0.005 % ophthalmic solution Commonly known as: XALATAN Place 1 drop into both eyes at bedtime.   multivitamin with minerals Tabs tablet Take 1 tablet by mouth daily. Centrum   omeprazole 20 MG capsule Commonly known as: PRILOSEC Take 1 capsule (20 mg total) by mouth daily before lunch.   timolol 0.25 % ophthalmic solution Commonly known as: TIMOPTIC Place 1 drop into both eyes every morning.   tiZANidine 4 MG tablet Commonly known as: ZANAFLEX Take 4 mg by mouth at bedtime. What changed: Another medication with the same name was added. Make sure you understand how and when to take each.   tiZANidine 2 MG tablet Commonly known as: ZANAFLEX Take 1 tablet (2 mg total) by mouth every 6 (six) hours as needed. What changed: You were already taking a medication with the same name, and this prescription was added. Make sure you understand how and when to take each.   traMADol 50 MG tablet Commonly known as: ULTRAM Take 50 mg by mouth every 8 (eight) hours as needed for moderate pain.   triamcinolone cream 0.5 % Commonly known as: KENALOG Apply 1 Application topically 2 (two) times daily as needed (rash).               Durable Medical Equipment  (From admission, onward)           Start     Ordered   12/21/22 1457  DME Walker rolling  Once       Question:  Patient needs a walker to treat with the following condition  Answer:  Status post right knee replacement   12/21/22 1456   12/21/22 1457  DME 3 n 1  Once        12/21/22 1456              Discharge Care Instructions  (From admission, onward)           Start     Ordered   12/23/22 0000  Weight bearing as tolerated       Question Answer  Comment  Laterality right   Extremity Lower      12/23/22 1306   12/22/22 0000  Weight bearing as tolerated       Question Answer Comment  Laterality right   Extremity Lower      12/22/22 1236  Diagnostic Studies: DG Chest 2 View  Result Date: 12/08/2022 CLINICAL DATA:  Preop chest exam.  Preop knee replacement. EXAM: CHEST - 2 VIEW COMPARISON:  12/23/2018 FINDINGS: The cardiomediastinal contours are normal. The lungs are clear. Pulmonary vasculature is normal. No consolidation, pleural effusion, or pneumothorax. No acute osseous abnormalities are seen. IMPRESSION: No active cardiopulmonary disease. Electronically Signed   By: Narda Rutherford M.D.   On: 12/08/2022 21:59    Disposition: Discharge disposition: 01-Home or Self Care       Discharge Instructions     Call MD / Call 911   Complete by: As directed    If you experience chest pain or shortness of breath, CALL 911 and be transported to the hospital emergency room.  If you develope a fever above 101 F, pus (white drainage) or increased drainage or redness at the wound, or calf pain, call your surgeon's office.   Call MD / Call 911   Complete by: As directed    If you experience chest pain or shortness of breath, CALL 911 and be transported to the hospital emergency room.  If you develope a fever above 101 F, pus (white drainage) or increased drainage or redness at the wound, or calf pain, call your surgeon's office.   Constipation Prevention   Complete by: As directed    Drink plenty of fluids.  Prune juice may be helpful.  You may use a stool softener, such as Colace (over the counter) 100 mg twice a day.  Use MiraLax (over the counter) for constipation as needed.   Constipation Prevention   Complete by: As directed    Drink plenty of fluids.  Prune juice may be helpful.  You may use a stool softener, such as Colace (over the counter) 100 mg twice a day.  Use MiraLax (over the counter) for constipation as  needed.   Diet general   Complete by: As directed    Do not put a pillow under the knee. Place it under the heel.   Complete by: As directed    Do not put a pillow under the knee. Place it under the heel.   Complete by: As directed    Increase activity slowly as tolerated   Complete by: As directed    Increase activity slowly as tolerated   Complete by: As directed    Post-operative opioid taper instructions:   Complete by: As directed    POST-OPERATIVE OPIOID TAPER INSTRUCTIONS: It is important to wean off of your opioid medication as soon as possible. If you do not need pain medication after your surgery it is ok to stop day one. Opioids include: Codeine, Hydrocodone(Norco, Vicodin), Oxycodone(Percocet, oxycontin) and hydromorphone amongst others.  Long term and even short term use of opiods can cause: Increased pain response Dependence Constipation Depression Respiratory depression And more.  Withdrawal symptoms can include Flu like symptoms Nausea, vomiting And more Techniques to manage these symptoms Hydrate well Eat regular healthy meals Stay active Use relaxation techniques(deep breathing, meditating, yoga) Do Not substitute Alcohol to help with tapering If you have been on opioids for less than two weeks and do not have pain than it is ok to stop all together.  Plan to wean off of opioids This plan should start within one week post op of your joint replacement. Maintain the same interval or time between taking each dose and first decrease the dose.  Cut the total daily intake of opioids by one tablet each day Next start to  increase the time between doses. The last dose that should be eliminated is the evening dose.      Post-operative opioid taper instructions:   Complete by: As directed    POST-OPERATIVE OPIOID TAPER INSTRUCTIONS: It is important to wean off of your opioid medication as soon as possible. If you do not need pain medication after your surgery it  is ok to stop day one. Opioids include: Codeine, Hydrocodone(Norco, Vicodin), Oxycodone(Percocet, oxycontin) and hydromorphone amongst others.  Long term and even short term use of opiods can cause: Increased pain response Dependence Constipation Depression Respiratory depression And more.  Withdrawal symptoms can include Flu like symptoms Nausea, vomiting And more Techniques to manage these symptoms Hydrate well Eat regular healthy meals Stay active Use relaxation techniques(deep breathing, meditating, yoga) Do Not substitute Alcohol to help with tapering If you have been on opioids for less than two weeks and do not have pain than it is ok to stop all together.  Plan to wean off of opioids This plan should start within one week post op of your joint replacement. Maintain the same interval or time between taking each dose and first decrease the dose.  Cut the total daily intake of opioids by one tablet each day Next start to increase the time between doses. The last dose that should be eliminated is the evening dose.      TED hose   Complete by: As directed    Use stockings (TED hose) for 2 weeks on both leg(s).  You may remove them at night for sleeping.   Weight bearing as tolerated   Complete by: As directed    Laterality: right   Extremity: Lower   Weight bearing as tolerated   Complete by: As directed    Laterality: right   Extremity: Lower        Follow-up Information     Jodi Geralds, MD Follow up in 2 week(s).   Specialty: Orthopedic Surgery Contact information: [address] [place] [address] [phone]         [facility] Follow up.   Specialty: Home Health Services Why: to provide home physical therapy visits Contact information: [address] [place] [address] [phone]                  Signed: Matthew Folks 12/23/2022, 1:06 PM

## 2023-02-11 ENCOUNTER — Other Ambulatory Visit: Payer: Self-pay | Admitting: Internal Medicine

## 2023-02-11 DIAGNOSIS — Z1231 Encounter for screening mammogram for malignant neoplasm of breast: Secondary | ICD-10-CM

## 2023-03-16 ENCOUNTER — Ambulatory Visit
Admission: RE | Admit: 2023-03-16 | Discharge: 2023-03-16 | Disposition: A | Discharge status: Home / Self Care | Payer: 59 | Source: Ambulatory Visit | Attending: Internal Medicine | Admitting: Internal Medicine

## 2023-03-16 DIAGNOSIS — Z1231 Encounter for screening mammogram for malignant neoplasm of breast: Secondary | ICD-10-CM

## 2023-12-08 ENCOUNTER — Ambulatory Visit: Admission: EM | Admit: 2023-12-08 | Discharge: 2023-12-08 | Disposition: A | Discharge status: Home / Self Care

## 2023-12-08 ENCOUNTER — Ambulatory Visit (INDEPENDENT_AMBULATORY_CARE_PROVIDER_SITE_OTHER)

## 2023-12-08 DIAGNOSIS — R0600 Dyspnea, unspecified: Secondary | ICD-10-CM

## 2023-12-08 DIAGNOSIS — J810 Acute pulmonary edema: Secondary | ICD-10-CM

## 2023-12-08 MED ORDER — FUROSEMIDE 20 MG PO TABS: 20.0000 mg | ORAL_TABLET | Freq: Every morning | ORAL | 0 refills | Status: DC

## 2023-12-08 NOTE — ED Provider Notes (Signed)
 [facility]    CSN: [id number] Arrival date & time: 12/08/23  1532      History   Chief Complaint Chief Complaint  Patient presents with   Shortness of Breath    HPI Tabitha Wilcox is a 68 y.o. adult.   Discussed the use of AI scribe software for clinical note transcription with the patient, who gave verbal consent to proceed.   Patient presents with shortness of breath and cough that began a couple of weeks ago after receiving a COVID vaccine. She has been experiencing significant respiratory symptoms including congestion and coughing, and has used two inhalers in the past 2 weeks when they usually last about a month. The patient describes her shortness of breath as requiring her to practice slowing her breathing down, and she becomes short-winded and unable to talk at times. She sometimes feels like her heart is racing or beating fast in association with the breathing difficulties. She has been constantly blowing her nose, though this has improved somewhat in recent days. She denies chest pain, leg swelling, nausea, vomiting, current headaches, or any history of heart failure. She stopped vaping recently and quit smoking cigarettes 3 years ago.   The following sections of the patient's history were reviewed and updated as appropriate: allergies, current medications, past family history, past medical history, past social history, past surgical history, and problem list.     Past Medical History:  Diagnosis Date   Allergy    Anxiety    Arthritis    Asthma    and bronchitisl reprots her sob is due to her asthma    Carpal tunnel syndrome on both sides    Depression    GERD (gastroesophageal reflux disease)    Rotator cuff injury    Right side   Sciatica    Shortness of breath    sob with exertion    Tobacco abuse    Ulcer    1990's    Patient Active Problem List   Diagnosis Date Noted   Foot drop, right 12/23/2022   Arthritis of right knee 12/21/2022    Primary osteoarthritis of left knee 12/30/2018   Spondylolisthesis of lumbosacral region 11/02/2016   Strain of right upper arm 07/30/2016   Hepatitis C antibody test positive 02/08/2015   Thoracic arthritis 12/21/2014   DJD (degenerative joint disease) of knee 10/20/2014   Obesity 07/20/2014   Pre-diabetes 07/20/2014   Insomnia 07/20/2014   Cervical spine arthritis (HCC) 03/16/2014   Family history of glaucoma 10/13/2013   Toenail deformity 06/14/2013   Osteopenia 05/08/2013   Healthcare maintenance 12/19/2012   Alcohol dependence in remission (HCC) 08/15/2012   Special screening for malignant neoplasms, colon 08/19/2011   Medial meniscus tear 07/14/2011   Tobacco abuse 04/13/2011   Knee pain, bilateral 03/31/2011   Ankle pain 03/12/2011   Esophageal stricture 03/12/2011   Asthma, chronic 12/26/2010   GERD (gastroesophageal reflux disease) 12/26/2010   Carpal tunnel syndrome on both sides    Rotator cuff injury     lumar surgery  PLIF L5-S1 02/08/2009   DEPRESSION 12/27/2008    Past Surgical History:  Procedure Laterality Date   APPENDECTOMY     APPLICATION OF ROBOTIC ASSISTANCE FOR SPINAL PROCEDURE N/A 11/02/2016   Procedure: APPLICATION OF ROBOTIC ASSISTANCE FOR SPINAL PROCEDURE;  Surgeon: Ditty, Morene Hicks, MD;  Location: [facility] OR;  Service: Neurosurgery;  Laterality: N/A;   EXPLORATORY LAPAROTOMY     reports they puntured something when they were taking the appendix ,  and had to go back and put in a drain     IRRIGATION AND DEBRIDEMENT ABSCESS Right 07/05/2012   Procedure: IRRIGATION AND DEBRIDEMENT RIGHT LABIAL/THIGH ABSCESS;  Surgeon: Vicenta DELENA Poli, MD;  Location: [facility] OR;  Service: General;  Laterality: Right;   left hip replacement      SHOULDER ARTHROSCOPY W/ ROTATOR CUFF REPAIR Right 2015   TOTAL KNEE ARTHROPLASTY Left 12/30/2018   Procedure: TOTAL KNEE ARTHROPLASTY;  Surgeon: Yvone Rush, MD;  Location: [facility];  Service: Orthopedics;  Laterality: Left;    TOTAL KNEE ARTHROPLASTY Right 12/21/2022   Procedure: TOTAL KNEE ARTHROPLASTY;  Surgeon: Yvone Rush, MD;  Location: [facility];  Service: Orthopedics;  Laterality: Right;   UPPER GASTROINTESTINAL ENDOSCOPY      OB History   No obstetric history on file.      Home Medications    Prior to Admission medications   Medication Sig Start Date End Date Taking? Authorizing Provider  albuterol  (PROAIR  HFA) 108 (90 Base) MCG/ACT inhaler Inhale 1-2 puffs into the lungs every 6 (six) hours as needed for wheezing or shortness of breath. 08/19/16  Yes Tobie Darron GAILS, MD  albuterol  (PROVENTIL ) (2.5 MG/3ML) 0.083% nebulizer solution Take 3 mLs (2.5 mg total) by nebulization every 6 (six) hours as needed for wheezing or shortness of breath. 07/30/16  Yes Tobie Darron GAILS, MD  amoxicillin  (AMOXIL ) 500 MG tablet Take 500 mg by mouth 2 (two) times daily. 09/07/23  Yes [provider]  atorvastatin (LIPITOR) 20 MG tablet Take 20 mg by mouth at bedtime. 10/29/23  Yes [provider]  fluticasone  (FLONASE ) 50 MCG/ACT nasal spray Place 2 sprays into both nostrils daily. 07/20/23  Yes [provider]  furosemide (LASIX) 20 MG tablet Take 1 tablet (20 mg total) by mouth every morning for 3 days. 12/08/23 12/11/23 Yes Iola Lukes, FNP  meloxicam  (MOBIC ) 15 MG tablet Take 15 mg by mouth daily. 11/22/23  Yes [provider]  Calcium  Carbonate-Vitamin D  (CALCIUM  600+D PO) Take 1 tablet by mouth daily.    [provider]  celecoxib  (CELEBREX ) 200 MG capsule Take 200 mg by mouth 2 (two) times daily. 09/07/17   [provider]  cyclobenzaprine  (FLEXERIL ) 10 MG tablet Take 10 mg by mouth daily. 09/03/17   [provider]  diclofenac  Sodium (VOLTAREN ) 1 % GEL Apply 2 g topically 4 (four) times daily. Patient taking differently: Apply 2 g topically 4 (four) times daily as needed (joint pain). 07/07/19   Vernona Aleck SAILOR, MD  docusate sodium  (COLACE) 100 MG capsule Take  1 capsule (100 mg total) by mouth 2 (two) times daily. Patient taking differently: Take 100 mg by mouth 2 (two) times daily as needed for moderate constipation. 12/30/18   Rondall Agent, PA-C  gabapentin  (NEURONTIN ) 600 MG tablet Take 600 mg by mouth 2 (two) times daily. 06/19/22   [provider]  HYDROmorphone  (DILAUDID ) 2 MG tablet Take 1 tablet (2 mg total) by mouth every 4 (four) hours as needed for severe pain. 12/21/22   Orlando Camellia POUR, PA-C  Krill Oil 350 MG CAPS Take 350 mg by mouth daily.     [provider]  latanoprost  (XALATAN ) 0.005 % ophthalmic solution Place 1 drop into both eyes at bedtime. 11/25/22   [provider]  Multiple Vitamin (MULTIVITAMIN WITH MINERALS) TABS tablet Take 1 tablet by mouth daily. Centrum    [provider]  omeprazole  (PRILOSEC) 20 MG capsule Take 1 capsule (20 mg total) by mouth  daily before lunch. 03/12/16   Tobie Darron GAILS, MD  timolol  (TIMOPTIC ) 0.25 % ophthalmic solution Place 1 drop into both eyes every morning. 10/14/22   [provider]  tiZANidine  (ZANAFLEX ) 2 MG tablet Take 1 tablet (2 mg total) by mouth every 6 (six) hours as needed. 12/21/22   Orlando Camellia POUR, PA-C  tiZANidine  (ZANAFLEX ) 4 MG tablet Take 4 mg by mouth at bedtime. 11/27/22   [provider]  traMADol  (ULTRAM ) 50 MG tablet Take 50 mg by mouth every 8 (eight) hours as needed for moderate pain. 11/21/22   [provider]  triamcinolone cream (KENALOG) 0.5 % Apply 1 Application topically 2 (two) times daily as needed (rash). 10/11/22   [provider]    Family History Family History  Problem Relation Age of Onset   Colon cancer Maternal Aunt    Colon cancer Paternal Uncle    Esophageal cancer Neg Hx    Rectal cancer Neg Hx    Stomach cancer Neg Hx     Social History Social History   Tobacco Use   Smoking status: Former    Current packs/day: 0.25    Average packs/day: 0.3 packs/day for 25.0 years (6.3 ttl  pk-yrs)    Types: Cigarettes   Smokeless tobacco: Never   Tobacco comments:    Currently smoking less than 1/4  pack per day depending on how stressed she is.. 12-23-2018 confirmd the previous statement   Vaping Use   Vaping status: Former  Substance Use Topics   Alcohol use: Yes    Comment: Martini sometimes.(1 a day) wine cooler   Drug use: No    Comment: stopped since 2 1/2 years. Before using Marijuana.      Allergies   Hydrocodone  and Oxycodone    Review of Systems Review of Systems  Constitutional:  Negative for fever.  HENT:  Positive for congestion and rhinorrhea. Negative for sneezing and sore throat.   Respiratory:  Positive for cough and shortness of breath. Negative for wheezing.   Cardiovascular:  Positive for palpitations (sometimes). Negative for chest pain and leg swelling.  Gastrointestinal:  Negative for nausea and vomiting.  Neurological:  Negative for headaches.  All other systems reviewed and are negative.    Physical Exam Triage Vital Signs ED Triage Vitals  Encounter Vitals Group     BP 12/08/23 1553 (!) 149/89     Girls Systolic BP Percentile --      Girls Diastolic BP Percentile --      Boys Systolic BP Percentile --      Boys Diastolic BP Percentile --      Pulse Rate 12/08/23 1542 (!) 115     Resp 12/08/23 1542 (!) 22     Temp 12/08/23 1553 98.6 F (37 C)     Temp Source 12/08/23 1553 Oral     SpO2 12/08/23 1542 95 %     Weight 12/08/23 1551 182 lb 1.6 oz (82.6 kg)     Height 12/08/23 1551 5' 1 (1.549 m)     Head Circumference --      Peak Flow --      Pain Score 12/08/23 1546 0     Pain Loc --      Pain Education --      Exclude from Growth Chart --    No data found.  Updated Vital Signs BP (!) 149/89 (BP Location: Left Arm)   Pulse 92   Temp 98.6 F (37 C) (Oral)   Resp ROLLEN)  22   Ht 5' 1 (1.549 m)   Wt 182 lb 1.6 oz (82.6 kg)   SpO2 95%   BMI 34.41 kg/m   Visual Acuity Right Eye Distance:   Left Eye Distance:    Bilateral Distance:    Right Eye Near:   Left Eye Near:    Bilateral Near:     Physical Exam Vitals reviewed.  Constitutional:      General: She is awake. She is not in acute distress.    Appearance: Normal appearance. She is well-developed. She is not ill-appearing, toxic-appearing or diaphoretic.  HENT:     Head: Normocephalic.     Right Ear: Tympanic membrane, ear canal and external ear normal. No drainage, swelling or tenderness. No middle ear effusion. Tympanic membrane is not erythematous.     Left Ear: Tympanic membrane, ear canal and external ear normal. No drainage, swelling or tenderness.  No middle ear effusion. Tympanic membrane is not erythematous.     Nose: Congestion and rhinorrhea present.     Mouth/Throat:     Lips: Pink.     Mouth: Mucous membranes are moist.     Pharynx: No pharyngeal swelling, oropharyngeal exudate, posterior oropharyngeal erythema or uvula swelling.     Tonsils: No tonsillar exudate or tonsillar abscesses.  Eyes:     General: Vision grossly intact.     Conjunctiva/sclera: Conjunctivae normal.  Cardiovascular:     Rate and Rhythm: Normal rate and regular rhythm.     Heart sounds: Normal heart sounds.  Pulmonary:     Effort: Pulmonary effort is normal. No tachypnea or respiratory distress.     Breath sounds: Normal breath sounds and air entry.     Comments: Respirations even and unlabored.  Musculoskeletal:        General: Normal range of motion.     Cervical back: Normal range of motion and neck supple.     Right lower leg: No edema.     Left lower leg: No edema.  Lymphadenopathy:     Cervical: No cervical adenopathy.  Skin:    General: Skin is warm and dry.  Neurological:     General: No focal deficit present.     Mental Status: She is alert and oriented to person, place, and time.  Psychiatric:        Behavior: Behavior is cooperative.      UC Treatments / Results  Labs (all labs ordered are listed, but only abnormal results  are displayed) Labs Reviewed - No data to display  EKG   Radiology DG Chest 2 View Result Date: 12/08/2023 CLINICAL DATA:  cough and sob EXAM: CHEST - 2 VIEW COMPARISON:  Chest x-ray 12/08/2022 FINDINGS: The heart and mediastinal contours are within normal limits. Atherosclerotic plaque. No focal consolidation. Pulmonary edema. No pleural effusion. No pneumothorax. No acute osseous abnormality. IMPRESSION: 1. Pulmonary edema. 2.  Aortic Atherosclerosis (ICD10-I70.0). Electronically Signed   By: Morgane  Naveau M.D.   On: 12/08/2023 17:51    Procedures Procedures (including critical care time)  Medications Ordered in UC Medications - No data to display  Initial Impression / Assessment and Plan / UC Course  I have reviewed the triage vital signs and the nursing notes.  Pertinent labs & imaging results that were available during my care of the patient were reviewed by me and considered in my medical decision making (see chart for details).     The patient presents with shortness of breath and cough that began several weeks  ago after receiving a COVID vaccine. She has had increased use of her inhalers, requiring two in the past two weeks. She describes episodes of short-windedness, difficulty speaking, and a sensation of her heart racing during symptoms. She reports ongoing nasal congestion with some improvement recently. She denies chest pain, leg swelling, nausea, vomiting, current headaches, or history of heart failure. On exam she is afebrile, nontoxic, and in no acute distress, observed ambulatory without significant dyspnea. Chest x-ray demonstrates pulmonary edema without signs of infection. Oxygen  saturation was 92% on room air, which improved to 95% on recheck. There is no lower extremity edema. Given the findings, Lasix 20 mg daily for three days was prescribed with instructions to weigh herself each morning before eating and to log results. She was counseled to seek emergency care for  weight gain of more than 3 to 5 pounds in 24 hours, worsening shortness of breath, new chest pain, or leg swelling. She was advised to contact her PCP tomorrow regarding today's findings, as she will likely need a cardiology referral for further evaluation of possible CHF.  Today's evaluation has revealed no signs of a dangerous process. Discussed diagnosis with patient and/or guardian. Patient and/or guardian aware of their diagnosis, possible red flag symptoms to watch out for and need for close follow up. Patient and/or guardian understands verbal and written discharge instructions. Patient and/or guardian comfortable with plan and disposition.  Patient and/or guardian has a clear mental status at this time, good insight into illness (after discussion and teaching) and has clear judgment to make decisions regarding their care  Documentation was completed with the aid of voice recognition software. Transcription may contain typographical errors.   Final Clinical Impressions(s) / UC Diagnoses   Final diagnoses:  Dyspnea, unspecified type  Acute pulmonary edema [facility])     Discharge Instructions      You were seen today for shortness of breath and cough that have been bothering you for the past couple of weeks. Your chest X-ray showed some fluid in the lungs, called pulmonary edema. There are no signs of infection, and your oxygen  level is normal.  To help remove extra fluid, you have been prescribed Lasix 20 mg to take once a day for the next three days. Weigh yourself every morning before eating and write down your weight. This helps us  see if you are retaining fluid. Call your primary care doctor tomorrow to let them know about your visit today. You will likely need a referral to a heart specialist (cardiologist) for further testing to see if you may have early heart failure. Go to the emergency room right away if you gain more than 3 to 5 pounds in one day, if your shortness of breath gets worse,  if you develop new chest pain, swelling in your legs, or any other concerning symptoms.     ED Prescriptions     Medication Sig Dispense Auth. Provider   furosemide (LASIX) 20 MG tablet Take 1 tablet (20 mg total) by mouth every morning for 3 days. 3 tablet Iola Lukes, FNP      PDMP not reviewed this encounter.   Iola Lukes, [place] 12/08/23 [phone]

## 2023-12-08 NOTE — ED Triage Notes (Signed)
 V/S done upon registering in Urgent Care/Patient Access per request.

## 2023-12-08 NOTE — ED Triage Notes (Signed)
 Additional Notes (upon patient being in the exam room): The patient reports using two inhalers over the past two weeks, with only temporary relief of symptoms. Shortness of breath (SOB) returns shortly after use. The patient contacted their primary care provider (PCP) regarding the possibility of prednisone , but was advised to seek care at Urgent Care.  Symptoms began approximately two weeks ago following administration of the Flu and COVID-19 vaccines (received at [facility]). The patient experienced flu-like symptoms for two days post-vaccination, which have since resolved. Currently, the patient denies cough, rhinorrhea, chest pain, or dizziness.

## 2023-12-08 NOTE — ED Triage Notes (Signed)
 Patient reports SOB today that increased in severity over the last few weeks since having vaccines (COVID/Flu).

## 2023-12-08 NOTE — Discharge Instructions (Addendum)
 You were seen today for shortness of breath and cough that have been bothering you for the past couple of weeks. Your chest X-ray showed some fluid in the lungs, called pulmonary edema. There are no signs of infection, and your oxygen  level is normal.  To help remove extra fluid, you have been prescribed Lasix 20 mg to take once a day for the next three days. Weigh yourself every morning before eating and write down your weight. This helps us  see if you are retaining fluid. Call your primary care doctor tomorrow to let them know about your visit today. You will likely need a referral to a heart specialist (cardiologist) for further testing to see if you may have early heart failure. Go to the emergency room right away if you gain more than 3 to 5 pounds in one day, if your shortness of breath gets worse, if you develop new chest pain, swelling in your legs, or any other concerning symptoms.

## 2023-12-12 ENCOUNTER — Emergency Department (HOSPITAL_COMMUNITY)

## 2023-12-12 ENCOUNTER — Inpatient Hospital Stay (HOSPITAL_COMMUNITY)
Admission: EM | Admit: 2023-12-12 | Discharge: 2023-12-15 | DRG: 286 | Disposition: A | Discharge status: Home / Self Care | Attending: Internal Medicine | Admitting: Internal Medicine

## 2023-12-12 ENCOUNTER — Other Ambulatory Visit: Payer: Self-pay

## 2023-12-12 ENCOUNTER — Encounter (HOSPITAL_COMMUNITY): Payer: Self-pay

## 2023-12-12 DIAGNOSIS — I110 Hypertensive heart disease with heart failure: Principal | ICD-10-CM | POA: Diagnosis present

## 2023-12-12 DIAGNOSIS — R0602 Shortness of breath: Principal | ICD-10-CM

## 2023-12-12 DIAGNOSIS — I5041 Acute combined systolic (congestive) and diastolic (congestive) heart failure: Secondary | ICD-10-CM | POA: Diagnosis present

## 2023-12-12 DIAGNOSIS — F419 Anxiety disorder, unspecified: Secondary | ICD-10-CM | POA: Diagnosis present

## 2023-12-12 DIAGNOSIS — E785 Hyperlipidemia, unspecified: Secondary | ICD-10-CM | POA: Diagnosis present

## 2023-12-12 DIAGNOSIS — Z791 Long term (current) use of non-steroidal anti-inflammatories (NSAID): Secondary | ICD-10-CM

## 2023-12-12 DIAGNOSIS — Z8249 Family history of ischemic heart disease and other diseases of the circulatory system: Secondary | ICD-10-CM

## 2023-12-12 DIAGNOSIS — E66811 Obesity, class 1: Secondary | ICD-10-CM | POA: Diagnosis present

## 2023-12-12 DIAGNOSIS — Z720 Tobacco use: Secondary | ICD-10-CM | POA: Diagnosis present

## 2023-12-12 DIAGNOSIS — I509 Heart failure, unspecified: Secondary | ICD-10-CM

## 2023-12-12 DIAGNOSIS — Z96642 Presence of left artificial hip joint: Secondary | ICD-10-CM | POA: Diagnosis present

## 2023-12-12 DIAGNOSIS — Z885 Allergy status to narcotic agent status: Secondary | ICD-10-CM

## 2023-12-12 DIAGNOSIS — K219 Gastro-esophageal reflux disease without esophagitis: Secondary | ICD-10-CM | POA: Diagnosis present

## 2023-12-12 DIAGNOSIS — Z79899 Other long term (current) drug therapy: Secondary | ICD-10-CM

## 2023-12-12 DIAGNOSIS — D649 Anemia, unspecified: Secondary | ICD-10-CM | POA: Diagnosis present

## 2023-12-12 DIAGNOSIS — I447 Left bundle-branch block, unspecified: Secondary | ICD-10-CM | POA: Diagnosis present

## 2023-12-12 DIAGNOSIS — J45998 Other asthma: Secondary | ICD-10-CM | POA: Diagnosis present

## 2023-12-12 DIAGNOSIS — I420 Dilated cardiomyopathy: Secondary | ICD-10-CM | POA: Diagnosis present

## 2023-12-12 DIAGNOSIS — Z6834 Body mass index (BMI) 34.0-34.9, adult: Secondary | ICD-10-CM

## 2023-12-12 DIAGNOSIS — F32A Depression, unspecified: Secondary | ICD-10-CM | POA: Diagnosis present

## 2023-12-12 DIAGNOSIS — J302 Other seasonal allergic rhinitis: Secondary | ICD-10-CM | POA: Diagnosis present

## 2023-12-12 DIAGNOSIS — Z96653 Presence of artificial knee joint, bilateral: Secondary | ICD-10-CM | POA: Diagnosis present

## 2023-12-12 DIAGNOSIS — I700 Atherosclerosis of aorta: Secondary | ICD-10-CM | POA: Diagnosis present

## 2023-12-12 DIAGNOSIS — Z87891 Personal history of nicotine dependence: Secondary | ICD-10-CM

## 2023-12-12 DIAGNOSIS — R7303 Prediabetes: Secondary | ICD-10-CM | POA: Diagnosis present

## 2023-12-12 DIAGNOSIS — Z560 Unemployment, unspecified: Secondary | ICD-10-CM

## 2023-12-12 DIAGNOSIS — H409 Unspecified glaucoma: Secondary | ICD-10-CM | POA: Diagnosis present

## 2023-12-12 DIAGNOSIS — J439 Emphysema, unspecified: Secondary | ICD-10-CM | POA: Diagnosis present

## 2023-12-12 DIAGNOSIS — J45909 Unspecified asthma, uncomplicated: Secondary | ICD-10-CM | POA: Diagnosis present

## 2023-12-12 DIAGNOSIS — G5603 Carpal tunnel syndrome, bilateral upper limbs: Secondary | ICD-10-CM | POA: Diagnosis present

## 2023-12-12 DIAGNOSIS — Z8711 Personal history of peptic ulcer disease: Secondary | ICD-10-CM

## 2023-12-12 DIAGNOSIS — E119 Type 2 diabetes mellitus without complications: Secondary | ICD-10-CM | POA: Diagnosis present

## 2023-12-12 DIAGNOSIS — F1021 Alcohol dependence, in remission: Secondary | ICD-10-CM | POA: Diagnosis present

## 2023-12-12 LAB — CBC
HCT: 32.3 % — ABNORMAL LOW (ref 36.0–46.0)
Hemoglobin: 10.2 g/dL — ABNORMAL LOW (ref 12.0–15.0)
MCH: 29 pg (ref 26.0–34.0)
MCHC: 31.6 g/dL (ref 30.0–36.0)
MCV: 91.8 fL (ref 80.0–100.0)
Platelets: 370 K/uL (ref 150–400)
RBC: 3.52 MIL/uL — ABNORMAL LOW (ref 3.87–5.11)
RDW: 13.9 % (ref 11.5–15.5)
WBC: 8.9 K/uL (ref 4.0–10.5)
nRBC: 0 % (ref 0.0–0.2)

## 2023-12-12 LAB — BASIC METABOLIC PANEL WITH GFR
Anion gap: 13 (ref 5–15)
BUN: 12 mg/dL (ref 8–23)
CO2: 26 mmol/L (ref 22–32)
Calcium: 9.3 mg/dL (ref 8.9–10.3)
Chloride: 104 mmol/L (ref 98–111)
Creatinine, Ser: 0.7 mg/dL (ref 0.44–1.00)
GFR, Estimated: >60 mL/min (ref >60)
Glucose, Bld: 137 mg/dL — ABNORMAL HIGH (ref 70–99)
Potassium: 3.7 mmol/L (ref 3.5–5.1)
Sodium: 142 mmol/L (ref 135–145)

## 2023-12-12 LAB — PRO BRAIN NATRIURETIC PEPTIDE: Pro Brain Natriuretic Peptide: 350 pg/mL — ABNORMAL HIGH (ref <300.0)

## 2023-12-12 LAB — TROPONIN T, HIGH SENSITIVITY: Troponin T High Sensitivity: <15 ng/L (ref 0–19)

## 2023-12-12 MED ORDER — ONDANSETRON HCL 4 MG/2ML IJ SOLN: 4.0000 mg | Freq: Four times a day (QID) | INTRAMUSCULAR | Status: DC | PRN

## 2023-12-12 MED ORDER — POTASSIUM CHLORIDE CRYS ER 20 MEQ PO TBCR
40.0000 meq | EXTENDED_RELEASE_TABLET | Freq: Once | ORAL | Status: AC
  Administered 2023-12-12: 40 meq via ORAL
  Filled 2023-12-12: qty 2

## 2023-12-12 MED ORDER — ATORVASTATIN CALCIUM 20 MG PO TABS
20.0000 mg | ORAL_TABLET | Freq: Every day | ORAL | Status: DC
  Administered 2023-12-12 – 2023-12-14 (×3): 20 mg via ORAL
  Filled 2023-12-12 (×3): qty 1

## 2023-12-12 MED ORDER — ACETAMINOPHEN 325 MG PO TABS
650.0000 mg | ORAL_TABLET | Freq: Four times a day (QID) | ORAL | Status: DC | PRN
  Administered 2023-12-12 (×2): 650 mg via ORAL
  Filled 2023-12-12 (×2): qty 2

## 2023-12-12 MED ORDER — FUROSEMIDE 10 MG/ML IJ SOLN
20.0000 mg | Freq: Once | INTRAMUSCULAR | Status: AC
  Administered 2023-12-12: 20 mg via INTRAVENOUS
  Filled 2023-12-12: qty 4

## 2023-12-12 MED ORDER — PREDNISONE 20 MG PO TABS
40.0000 mg | ORAL_TABLET | Freq: Every day | ORAL | Status: DC
  Administered 2023-12-13 – 2023-12-15 (×3): 40 mg via ORAL
  Filled 2023-12-12 (×3): qty 2

## 2023-12-12 MED ORDER — ONDANSETRON HCL 4 MG PO TABS: 4.0000 mg | ORAL_TABLET | Freq: Four times a day (QID) | ORAL | Status: DC | PRN

## 2023-12-12 MED ORDER — CAMPHOR-MENTHOL 0.5-0.5 % EX LOTN
TOPICAL_LOTION | CUTANEOUS | Status: DC | PRN
  Filled 2023-12-12: qty 222

## 2023-12-12 MED ORDER — ACETAMINOPHEN 650 MG RE SUPP: 650.0000 mg | Freq: Four times a day (QID) | RECTAL | Status: DC | PRN

## 2023-12-12 MED ORDER — FUROSEMIDE 10 MG/ML IJ SOLN
20.0000 mg | Freq: Two times a day (BID) | INTRAMUSCULAR | Status: DC
  Administered 2023-12-12 – 2023-12-14 (×4): 20 mg via INTRAVENOUS
  Filled 2023-12-12 (×4): qty 2

## 2023-12-12 MED ORDER — IPRATROPIUM-ALBUTEROL 0.5-2.5 (3) MG/3ML IN SOLN
3.0000 mL | Freq: Three times a day (TID) | RESPIRATORY_TRACT | Status: DC
  Administered 2023-12-12 – 2023-12-13 (×5): 3 mL via RESPIRATORY_TRACT
  Filled 2023-12-12 (×4): qty 3

## 2023-12-12 MED ORDER — METHYLPREDNISOLONE SODIUM SUCC 125 MG IJ SOLR: 125.0000 mg | Freq: Once | INTRAMUSCULAR | Status: DC

## 2023-12-12 MED ORDER — LISINOPRIL 5 MG PO TABS
2.5000 mg | ORAL_TABLET | Freq: Every day | ORAL | Status: DC
  Administered 2023-12-12: 2.5 mg via ORAL
  Filled 2023-12-12: qty 1

## 2023-12-12 NOTE — ED Triage Notes (Signed)
 Pt comes via [facility] from home for SOB since 2am that woke her from her sleep, seen recently at [facility] for pulmonary edema, given lasix prescription , upon EMS arrival oxygen  was in the low 90's, PTA received 10mg  albuterol , 1mg  Atrovent, 2 gm mag and 125 solumedrol

## 2023-12-12 NOTE — H&P (Signed)
 History and Physical    Patient: Tabitha Wilcox FMW:[id number] DOB: 03-Nov-1955 DOA: 12/12/2023 DOS: the patient was seen and examined on 12/12/2023 PCP: Shelda Atlas, MD  Patient coming from: Home  Chief Complaint:  Chief Complaint  Patient presents with   Shortness of Breath   HPI: Tabitha Wilcox is a 68 y.o. adult with medical history significant of seasonal allergies, anxiety, depression, asthma, bilateral carpal tunnel, GERD, right rotator cuff injury, sciatica, dyspnea on exertion, history of tobacco abuse, PUD who presented to the emergency department with complaints of progressively worse dyspnea that woke her up from sleep earlier today.  She was recently seen at an urgent care center due to pulmonary edema and given a furosemide prescription.  EMS described that the patient was satting in the low 90s when they arrived.  They gave her a continuous 10 mg albuterol  plus ipratropium 1 mg neb, magnesium  sulfate 2 g IVPB and 125 mg of IV Solu-Medrol .  She describes orthopnea, but no chest pain, diaphoresis, palpitations, lower extremity edema or ascites.  She thinks that since she got her knee surgery that she has been less active and physically deconditioned.  She denied fever, chills, rhinorrhea, sore throat, wheezing or hemoptysis. No abdominal pain, nausea, emesis, diarrhea, constipation, melena or hematochezia.  No flank pain, dysuria, frequency or hematuria.  No polyuria, polydipsia, polyphagia or blurred vision.   Lab work: CBC showed a white count of 8.9, hemoglobin 10.2 g/dL and platelets 629.  BMP showed a glucose of 137 mg/dL, was otherwise normal.  First troponin unremarkable.  proBNP was 350.0 pg/mL.  Imaging: 2 view chest radiograph showing cardiomegaly with worsening vascular congestion and moderate diffuse interstitial edema.  Increased perihilar opacity in both lungs which are most likely alveolar edema.  Pneumonic component strictly excluded.  Bilateral small pleural  effusions.   ED course: Initial vital signs were temperature 98.4 F, pulse 104, respiration 26, BP 162/85 mmHg and O2 sat 100% on room air.  The patient received furosemide 20 mg IVP.  I added KCl 40 mEq p.o. x 1 dose.  Review of Systems: As mentioned in the history of present illness. All other systems reviewed and are negative. Past Medical History:  Diagnosis Date   Allergy    Anxiety    Arthritis    Asthma    and bronchitisl reprots her sob is due to her asthma    Carpal tunnel syndrome on both sides    Depression    GERD (gastroesophageal reflux disease)    Rotator cuff injury    Right side   Sciatica    Shortness of breath    sob with exertion    Tobacco abuse    Ulcer    1990's   Past Surgical History:  Procedure Laterality Date   APPENDECTOMY     APPLICATION OF ROBOTIC ASSISTANCE FOR SPINAL PROCEDURE N/A 11/02/2016   Procedure: APPLICATION OF ROBOTIC ASSISTANCE FOR SPINAL PROCEDURE;  Surgeon: Ditty, Morene Hicks, MD;  Location: [facility];  Service: Neurosurgery;  Laterality: N/A;   EXPLORATORY LAPAROTOMY     reports they puntured something when they were taking the appendix , and had to go back and put in a drain     IRRIGATION AND DEBRIDEMENT ABSCESS Right 07/05/2012   Procedure: IRRIGATION AND DEBRIDEMENT RIGHT LABIAL/THIGH ABSCESS;  Surgeon: Vicenta DELENA Poli, MD;  Location: [facility] OR;  Service: General;  Laterality: Right;   left hip replacement      SHOULDER ARTHROSCOPY W/ ROTATOR CUFF REPAIR  Right 2015   TOTAL KNEE ARTHROPLASTY Left 12/30/2018   Procedure: TOTAL KNEE ARTHROPLASTY;  Surgeon: Yvone Rush, MD;  Location: [facility];  Service: Orthopedics;  Laterality: Left;   TOTAL KNEE ARTHROPLASTY Right 12/21/2022   Procedure: TOTAL KNEE ARTHROPLASTY;  Surgeon: Yvone Rush, MD;  Location: [facility];  Service: Orthopedics;  Laterality: Right;   UPPER GASTROINTESTINAL ENDOSCOPY     Social History:  reports that she has quit smoking. Her smoking use included cigarettes. She  has a 6.3 pack-year smoking history. She has never used smokeless tobacco. She reports current alcohol use. She reports that she does not use drugs.  Allergies  Allergen Reactions   Hydrocodone  Itching   Oxycodone  Itching    Family History  Problem Relation Age of Onset   Colon cancer Maternal Aunt    Colon cancer Paternal Uncle    Esophageal cancer Neg Hx    Rectal cancer Neg Hx    Stomach cancer Neg Hx     Prior to Admission medications   Medication Sig Start Date End Date Taking? Authorizing Provider  albuterol  (PROAIR  HFA) 108 (90 Base) MCG/ACT inhaler Inhale 1-2 puffs into the lungs every 6 (six) hours as needed for wheezing or shortness of breath. 08/19/16   Tobie Darron GAILS, MD  albuterol  (PROVENTIL ) (2.5 MG/3ML) 0.083% nebulizer solution Take 3 mLs (2.5 mg total) by nebulization every 6 (six) hours as needed for wheezing or shortness of breath. 07/30/16   Tobie Darron GAILS, MD  amoxicillin  (AMOXIL ) 500 MG tablet Take 500 mg by mouth 2 (two) times daily. 09/07/23   [provider]  atorvastatin (LIPITOR) 20 MG tablet Take 20 mg by mouth at bedtime. 10/29/23   [provider]  Calcium  Carbonate-Vitamin D  (CALCIUM  600+D PO) Take 1 tablet by mouth daily.    [provider]  celecoxib  (CELEBREX ) 200 MG capsule Take 200 mg by mouth 2 (two) times daily. 09/07/17   [provider]  cyclobenzaprine  (FLEXERIL ) 10 MG tablet Take 10 mg by mouth daily. 09/03/17   [provider]  diclofenac  Sodium (VOLTAREN ) 1 % GEL Apply 2 g topically 4 (four) times daily. Patient taking differently: Apply 2 g topically 4 (four) times daily as needed (joint pain). 07/07/19   Vernona Aleck SAILOR, MD  docusate sodium  (COLACE) 100 MG capsule Take 1 capsule (100 mg total) by mouth 2 (two) times daily. Patient taking differently: Take 100 mg by mouth 2 (two) times daily as needed for moderate constipation. 12/30/18   Rondall Agent, PA-C  fluticasone  (FLONASE ) 50 MCG/ACT nasal  spray Place 2 sprays into both nostrils daily. 07/20/23   [provider]  furosemide (LASIX) 20 MG tablet Take 1 tablet (20 mg total) by mouth every morning for 3 days. 12/08/23 12/11/23  Iola Lukes, FNP  gabapentin  (NEURONTIN ) 600 MG tablet Take 600 mg by mouth 2 (two) times daily. 06/19/22   [provider]  HYDROmorphone  (DILAUDID ) 2 MG tablet Take 1 tablet (2 mg total) by mouth every 4 (four) hours as needed for severe pain. 12/21/22   Orlando Camellia POUR, PA-C  Krill Oil 350 MG CAPS Take 350 mg by mouth daily.     [provider]  latanoprost  (XALATAN ) 0.005 % ophthalmic solution Place 1 drop into both eyes at bedtime. 11/25/22   [provider]  meloxicam  (MOBIC ) 15 MG tablet Take 15 mg by mouth daily. 11/22/23   [provider]  Multiple Vitamin (MULTIVITAMIN WITH MINERALS) TABS tablet Take 1 tablet by mouth daily.  Centrum    [provider]  omeprazole  (PRILOSEC) 20 MG capsule Take 1 capsule (20 mg total) by mouth daily before lunch. 03/12/16   Tobie Darron GAILS, MD  timolol  (TIMOPTIC ) 0.25 % ophthalmic solution Place 1 drop into both eyes every morning. 10/14/22   [provider]  tiZANidine  (ZANAFLEX ) 2 MG tablet Take 1 tablet (2 mg total) by mouth every 6 (six) hours as needed. 12/21/22   Orlando Camellia POUR, PA-C  tiZANidine  (ZANAFLEX ) 4 MG tablet Take 4 mg by mouth at bedtime. 11/27/22   [provider]  traMADol  (ULTRAM ) 50 MG tablet Take 50 mg by mouth every 8 (eight) hours as needed for moderate pain. 11/21/22   [provider]  triamcinolone cream (KENALOG) 0.5 % Apply 1 Application topically 2 (two) times daily as needed (rash). 10/11/22   [provider]    Physical Exam: Vitals:   12/12/23 0614 12/12/23 0649 12/12/23 0700  BP: (!) 162/85 (!) 159/84 (!) 148/75  Pulse: (!) 104 (!) 111 (!) 102  Resp: (!) 26 (!) 26 19  Temp: 98.4 F (36.9 C)    TempSrc: Oral    SpO2: 100% 95% 94%   Physical  Exam Constitutional:      General: She is awake. She is not in acute distress.    Appearance: She is obese. She is ill-appearing.  HENT:     Head: Normocephalic.     Nose: No rhinorrhea.     Mouth/Throat:     Mouth: Mucous membranes are moist.     Pharynx: No oropharyngeal exudate.  Eyes:     General: No scleral icterus.    Pupils: Pupils are equal, round, and reactive to light.  Neck:     Vascular: No JVD.  Cardiovascular:     Rate and Rhythm: Normal rate and regular rhythm.  Pulmonary:     Effort: No accessory muscle usage or respiratory distress.     Breath sounds: Examination of the right-lower field reveals rales. Examination of the left-lower field reveals rales. Rales present. No wheezing or rhonchi.  Abdominal:     General: Bowel sounds are normal.     Palpations: Abdomen is soft.     Tenderness: There is no abdominal tenderness. There is no right CVA tenderness or left CVA tenderness.  Musculoskeletal:     Cervical back: Neck supple.     Right lower leg: Edema present.     Left lower leg: Edema present.  Skin:    General: Skin is warm and dry.  Neurological:     General: No focal deficit present.     Mental Status: She is alert and oriented to person, place, and time.  Psychiatric:        Mood and Affect: Mood normal.        Behavior: Behavior normal. Behavior is cooperative.     Data Reviewed:   Results are pending, will review when available.  EKG: Vent. rate 105 BPM PR interval 134 ms QRS duration 149 ms QT/QTcB 406/537 ms P-R-T axes 69 69 60 Sinus tachycardia Left bundle branch block  Assessment and Plan: Principal Problem:   New onset of congestive heart failure (HCC) Observation/telemetry. Continue supplemental oxygen .   Sodium and fluid restriction. Continue furosemide 20 mg IVP twice daily. Monitor daily weights, intake and output. Begin lisinopril 2.5 mg p.o. daily. No beta-blocker due to acute decompensation. Check  echocardiogram. Cardiology will be seeing in the morning.  Active Problems:   Asthma, chronic/emphysema Continue supplemental oxygen .  Methylprednisolone  125 mg IVP x1 given by EMS. Followed by prednisone  40 mg p.o. daily in a.m. Scheduled bronchodilators.    GERD (gastroesophageal reflux disease) Antiacid, H2 blocker or PPI as needed.    Tobacco abuse In remission for the past 3 years.    Alcohol dependence in remission [facility]) She occasionally drinks socially.    Pre-diabetes Monitor blood glucose following glucocorticoids.    Class 1 obesity Improving. Current BMI 34.41 kg/m. Continue lifestyle modifications. Follow-up closely with PCP and/or bariatric clinic.    Normochromic anemia Denied hematochezia, melena or vaginal bleeding.   Monitor hematocrit and hemoglobin. Follow-up with primary care provider as an outpatient.    Hyperlipidemia Continue atorvastatin 20 mg p.o. daily.    Glaucoma Continue timolol  and Xalatan  drops. Follow-up with ophthalmology as an outpatient.     Advance Care Planning:   Code Status: Full Code   Consults:   Family Communication:   Severity of Illness: The appropriate patient status for this patient is OBSERVATION. Observation status is judged to be reasonable and necessary in order to provide the required intensity of service to ensure the patient's safety. The patient's presenting symptoms, physical exam findings, and initial radiographic and laboratory data in the context of their medical condition is felt to place them at decreased risk for further clinical deterioration. Furthermore, it is anticipated that the patient will be medically stable for discharge from the hospital within 2 midnights of admission.   Author: Alm Dorn Castor, MD 12/12/2023 7:52 AM  For on call review [email or web address].   This document was prepared using Dragon voice recognition software and may contain some unintended transcription errors.

## 2023-12-12 NOTE — ED Provider Notes (Signed)
 I discussed the case with Dr. Celinda, who will admit.   Freddi Hamilton, MD 12/12/23 [phone]

## 2023-12-12 NOTE — ED Provider Notes (Addendum)
 [facility] Emergency Department Provider Note MRN:  [id number]  Arrival date & time: 12/12/23     Chief Complaint   Shortness of Breath   History of Present Illness   Tabitha Wilcox is a 68 y.o. year-old adult with a history of asthma presenting to the ED with chief complaint of shortness of breath.  Worsening shortness of breath for the past few days, started on some medications from urgent care.  Was feeling better but this morning at 3 AM shortness of breath was worse, felt very flushed, increased work of breathing.  Denies chest pain.  No recent leg pain or swelling.  Review of Systems  A thorough review of systems was obtained and all systems are negative except as noted in the HPI and PMH.   Patient's Health History    Past Medical History:  Diagnosis Date   Allergy    Anxiety    Arthritis    Asthma    and bronchitisl reprots her sob is due to her asthma    Carpal tunnel syndrome on both sides    Depression    GERD (gastroesophageal reflux disease)    Rotator cuff injury    Right side   Sciatica    Shortness of breath    sob with exertion    Tobacco abuse    Ulcer    1990's    Past Surgical History:  Procedure Laterality Date   APPENDECTOMY     APPLICATION OF ROBOTIC ASSISTANCE FOR SPINAL PROCEDURE N/A 11/02/2016   Procedure: APPLICATION OF ROBOTIC ASSISTANCE FOR SPINAL PROCEDURE;  Surgeon: Ditty, Morene Hicks, MD;  Location: [facility] OR;  Service: Neurosurgery;  Laterality: N/A;   EXPLORATORY LAPAROTOMY     reports they puntured something when they were taking the appendix , and had to go back and put in a drain     IRRIGATION AND DEBRIDEMENT ABSCESS Right 07/05/2012   Procedure: IRRIGATION AND DEBRIDEMENT RIGHT LABIAL/THIGH ABSCESS;  Surgeon: Vicenta DELENA Poli, MD;  Location: [facility] OR;  Service: General;  Laterality: Right;   left hip replacement      SHOULDER ARTHROSCOPY W/ ROTATOR CUFF REPAIR Right 2015   TOTAL KNEE ARTHROPLASTY Left  12/30/2018   Procedure: TOTAL KNEE ARTHROPLASTY;  Surgeon: Yvone Rush, MD;  Location: [facility];  Service: Orthopedics;  Laterality: Left;   TOTAL KNEE ARTHROPLASTY Right 12/21/2022   Procedure: TOTAL KNEE ARTHROPLASTY;  Surgeon: Yvone Rush, MD;  Location: [facility];  Service: Orthopedics;  Laterality: Right;   UPPER GASTROINTESTINAL ENDOSCOPY      Family History  Problem Relation Age of Onset   Colon cancer Maternal Aunt    Colon cancer Paternal Uncle    Esophageal cancer Neg Hx    Rectal cancer Neg Hx    Stomach cancer Neg Hx     Social History   Socioeconomic History   Marital status: Divorced    Spouse name: Not on file   Number of children: 3   Years of education: Not on file   Highest education level: Not on file  Occupational History   Occupation: Unemployed   Tobacco Use   Smoking status: Former    Current packs/day: 0.25    Average packs/day: 0.3 packs/day for 25.0 years (6.3 ttl pk-yrs)    Types: Cigarettes   Smokeless tobacco: Never   Tobacco comments:    Currently smoking less than 1/4  pack per day depending on how stressed she is.. 12-23-2018 confirmd the previous statement   Vaping  Use   Vaping status: Former  Substance and Sexual Activity   Alcohol use: Yes    Comment: Martini sometimes.(1 a day) wine cooler   Drug use: No    Comment: stopped since 2 1/2 years. Before using Marijuana.    Sexual activity: Not Currently  Other Topics Concern   Not on file  Social History Narrative   Occupation: Worked in Physicist, medical.  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       Daughter, 33   Son, 74   Son, 32 (Deceased, shot at fathers home in 2007)   Does not exercise anymore due to foot/sciatica pain      Social Drivers of Corporate investment banker Strain: Not on file  Food Insecurity: No Food Insecurity (12/21/2022)   Hunger Vital Sign    Worried  About Running Out of Food in the Last Year: Never true    Ran Out of Food in the Last Year: Never true  Transportation Needs: No Transportation Needs (12/21/2022)   PRAPARE - Administrator, Civil Service (Medical): No    Lack of Transportation (Non-Medical): No  Physical Activity: Not on file  Stress: Not on file  Social Connections: Unknown (07/22/2021)   Received from [facility]   Social Network    Social Network: Not on file  Intimate Partner Violence: Not At Risk (12/21/2022)   Humiliation, Afraid, Rape, and Kick questionnaire    Fear of Current or Ex-Partner: No    Emotionally Abused: No    Physically Abused: No    Sexually Abused: No     Physical Exam   Vitals:   12/12/23 0649 12/12/23 0700  BP: (!) 159/84 (!) 148/75  Pulse: (!) 111 (!) 102  Resp: (!) 26 19  Temp:    SpO2: 95% 94%    CONSTITUTIONAL: Well-appearing, NAD NEURO/PSYCH:  Alert and oriented x 3, no focal deficits EYES:  eyes equal and reactive ENT/NECK:  no LAD, no JVD CARDIO: Tachycardic rate, well-perfused, normal S1 and S2 PULM: Mild tachypnea, scattered wheezing and rhonchi GI/GU:  non-distended, non-tender MSK/SPINE:  No gross deformities, no edema SKIN:  no rash, atraumatic   *Additional and/or pertinent findings included in MDM below  Diagnostic and Interventional Summary    EKG Interpretation Date/Time:    Ventricular Rate:    PR Interval:    QRS Duration:    QT Interval:    QTC Calculation:   R Axis:      Text Interpretation:         Labs Reviewed  BASIC METABOLIC PANEL WITH GFR - Abnormal; Notable for the following components:      Result Value   Glucose, Bld 137 (*)    All other components within normal limits  CBC - Abnormal; Notable for the following components:   RBC 3.52 (*)    Hemoglobin 10.2 (*)    HCT 32.3 (*)    All other components within normal limits  PRO BRAIN NATRIURETIC PEPTIDE - Abnormal; Notable for the following components:   Pro Brain  Natriuretic Peptide 350.0 (*)    All other components within normal limits  TROPONIN T, HIGH SENSITIVITY    DG Chest 2 View  Final Result      Medications  furosemide (LASIX) injection 20 mg (has no administration in time range)     Procedures  /  Critical Care Procedures  ED Course and Medical Decision Making  Initial Impression and Ddx Urgent care a few days ago favored pulmonary edema as patient's underlying etiology of shortness of breath.  This is a consideration.  Could also be asthma exacerbation given patient's history of asthma.  She does not appear obviously fluid overloaded on exam, no JVD, no leg swelling.  She is feeling a lot better after EMS interventions, namely the breathing treatments.  Overall doubt PE, doubt ACS  Past medical/surgical history that increases complexity of ED encounter: Asthma  Interpretation of Diagnostics I personally reviewed the EKG and my interpretation is as follows: Bundle branch block, sinus rhythm.  1 year ago patient did not have a bundle branch block.  Labs, x-ray pending  Patient Reassessment and Ultimate Disposition/Management     Labs and chest x-ray suspicious for new onset heart failure, will plan for hospitalist admission.  Patient management required discussion with the following services or consulting groups:  None  Complexity of Problems Addressed Acute illness or injury that poses threat of life of bodily function  Additional Data Reviewed and Analyzed Further history obtained from: Prior labs/imaging results  Additional Factors Impacting ED Encounter Risk Consideration of hospitalization  Ozell HERO. Theadore, MD [facility] Emergency Medicine [facility] [email or web address]  Final Clinical Impressions(s) / ED Diagnoses     ICD-10-CM   1. SOB (shortness of breath)  R06.02       ED Discharge Orders     None        Discharge Instructions Discussed with and Provided to Patient:   Discharge  Instructions   None      Theadore Ozell HERO, MD 12/12/23 [id number]    Theadore Ozell HERO, MD 12/12/23 [phone]

## 2023-12-13 ENCOUNTER — Observation Stay (HOSPITAL_COMMUNITY)

## 2023-12-13 DIAGNOSIS — J302 Other seasonal allergic rhinitis: Secondary | ICD-10-CM | POA: Diagnosis present

## 2023-12-13 DIAGNOSIS — R0602 Shortness of breath: Secondary | ICD-10-CM | POA: Diagnosis present

## 2023-12-13 DIAGNOSIS — Z6834 Body mass index (BMI) 34.0-34.9, adult: Secondary | ICD-10-CM | POA: Diagnosis not present

## 2023-12-13 DIAGNOSIS — F1021 Alcohol dependence, in remission: Secondary | ICD-10-CM | POA: Diagnosis present

## 2023-12-13 DIAGNOSIS — I5041 Acute combined systolic (congestive) and diastolic (congestive) heart failure: Secondary | ICD-10-CM | POA: Diagnosis present

## 2023-12-13 DIAGNOSIS — Z8249 Family history of ischemic heart disease and other diseases of the circulatory system: Secondary | ICD-10-CM | POA: Diagnosis not present

## 2023-12-13 DIAGNOSIS — F419 Anxiety disorder, unspecified: Secondary | ICD-10-CM | POA: Diagnosis present

## 2023-12-13 DIAGNOSIS — J439 Emphysema, unspecified: Secondary | ICD-10-CM | POA: Diagnosis present

## 2023-12-13 DIAGNOSIS — K219 Gastro-esophageal reflux disease without esophagitis: Secondary | ICD-10-CM | POA: Diagnosis present

## 2023-12-13 DIAGNOSIS — J45998 Other asthma: Secondary | ICD-10-CM | POA: Diagnosis present

## 2023-12-13 DIAGNOSIS — Z96642 Presence of left artificial hip joint: Secondary | ICD-10-CM | POA: Diagnosis present

## 2023-12-13 DIAGNOSIS — I5021 Acute systolic (congestive) heart failure: Secondary | ICD-10-CM

## 2023-12-13 DIAGNOSIS — E119 Type 2 diabetes mellitus without complications: Secondary | ICD-10-CM | POA: Diagnosis present

## 2023-12-13 DIAGNOSIS — Z96653 Presence of artificial knee joint, bilateral: Secondary | ICD-10-CM | POA: Diagnosis present

## 2023-12-13 DIAGNOSIS — I700 Atherosclerosis of aorta: Secondary | ICD-10-CM | POA: Diagnosis present

## 2023-12-13 DIAGNOSIS — Z79899 Other long term (current) drug therapy: Secondary | ICD-10-CM | POA: Diagnosis not present

## 2023-12-13 DIAGNOSIS — I509 Heart failure, unspecified: Secondary | ICD-10-CM | POA: Diagnosis not present

## 2023-12-13 DIAGNOSIS — E66811 Obesity, class 1: Secondary | ICD-10-CM | POA: Diagnosis present

## 2023-12-13 DIAGNOSIS — D649 Anemia, unspecified: Secondary | ICD-10-CM | POA: Diagnosis present

## 2023-12-13 DIAGNOSIS — I420 Dilated cardiomyopathy: Secondary | ICD-10-CM | POA: Diagnosis present

## 2023-12-13 DIAGNOSIS — Z885 Allergy status to narcotic agent status: Secondary | ICD-10-CM | POA: Diagnosis not present

## 2023-12-13 DIAGNOSIS — I447 Left bundle-branch block, unspecified: Secondary | ICD-10-CM | POA: Diagnosis present

## 2023-12-13 DIAGNOSIS — I110 Hypertensive heart disease with heart failure: Secondary | ICD-10-CM | POA: Diagnosis present

## 2023-12-13 DIAGNOSIS — H409 Unspecified glaucoma: Secondary | ICD-10-CM | POA: Diagnosis present

## 2023-12-13 DIAGNOSIS — F32A Depression, unspecified: Secondary | ICD-10-CM | POA: Diagnosis present

## 2023-12-13 DIAGNOSIS — E785 Hyperlipidemia, unspecified: Secondary | ICD-10-CM | POA: Diagnosis present

## 2023-12-13 DIAGNOSIS — G5603 Carpal tunnel syndrome, bilateral upper limbs: Secondary | ICD-10-CM | POA: Diagnosis present

## 2023-12-13 LAB — COMPREHENSIVE METABOLIC PANEL WITH GFR
ALT: 21 U/L (ref 0–44)
AST: 28 U/L (ref 15–41)
Albumin: 4.7 g/dL (ref 3.5–5.0)
Alkaline Phosphatase: 113 U/L (ref 38–126)
Anion gap: 13 (ref 5–15)
BUN: 21 mg/dL (ref 8–23)
CO2: 23 mmol/L (ref 22–32)
Calcium: 10.2 mg/dL (ref 8.9–10.3)
Chloride: 100 mmol/L (ref 98–111)
Creatinine, Ser: 0.84 mg/dL (ref 0.44–1.00)
GFR, Estimated: >60 mL/min (ref >60)
Glucose, Bld: 128 mg/dL — ABNORMAL HIGH (ref 70–99)
Potassium: 4.5 mmol/L (ref 3.5–5.1)
Sodium: 136 mmol/L (ref 135–145)
Total Bilirubin: 0.7 mg/dL (ref 0.0–1.2)
Total Protein: 7.9 g/dL (ref 6.5–8.1)

## 2023-12-13 LAB — HIV ANTIBODY (ROUTINE TESTING W REFLEX): HIV Screen 4th Generation wRfx: NONREACTIVE

## 2023-12-13 LAB — FOLATE: Folate: 17.9 ng/mL (ref >5.9)

## 2023-12-13 LAB — VITAMIN B12: Vitamin B-12: 372 pg/mL (ref 180–914)

## 2023-12-13 LAB — TROPONIN T, HIGH SENSITIVITY: Troponin T High Sensitivity: 18 ng/L (ref 0–19)

## 2023-12-13 LAB — CBC
HCT: 35 % — ABNORMAL LOW (ref 36.0–46.0)
Hemoglobin: 10.9 g/dL — ABNORMAL LOW (ref 12.0–15.0)
MCH: 27.9 pg (ref 26.0–34.0)
MCHC: 31.1 g/dL (ref 30.0–36.0)
MCV: 89.5 fL (ref 80.0–100.0)
Platelets: 452 K/uL — ABNORMAL HIGH (ref 150–400)
RBC: 3.91 MIL/uL (ref 3.87–5.11)
RDW: 14 % (ref 11.5–15.5)
WBC: 12.3 K/uL — ABNORMAL HIGH (ref 4.0–10.5)
nRBC: 0 % (ref 0.0–0.2)

## 2023-12-13 LAB — URINE DRUG SCREEN
Amphetamines: NEGATIVE
Barbiturates: NEGATIVE
Benzodiazepines: NEGATIVE
Cocaine: NEGATIVE
Fentanyl: NEGATIVE
Methadone Scn, Ur: NEGATIVE
Opiates: NEGATIVE
Tetrahydrocannabinol: POSITIVE — AB

## 2023-12-13 LAB — ECHOCARDIOGRAM COMPLETE
AR max vel: 1.41 cm2
AV Area VTI: 1.3 cm2
AV Area mean vel: 1.27 cm2
AV Mean grad: 6 mmHg
AV Peak grad: 10.4 mmHg
Ao pk vel: 1.61 m/s
Area-P 1/2: 5.42 cm2
Calc EF: 48.9 %
Height: 61 in
S' Lateral: 4.2 cm
Single Plane A2C EF: 45.7 %
Single Plane A4C EF: 51 %
Weight: 2592 [oz_av]

## 2023-12-13 LAB — FERRITIN: Ferritin: 76 ng/mL (ref 11–307)

## 2023-12-13 LAB — RETICULOCYTES
Immature Retic Fract: 19.2 % — ABNORMAL HIGH (ref 2.3–15.9)
RBC.: 3.83 MIL/uL — ABNORMAL LOW (ref 3.87–5.11)
Retic Count, Absolute: 95.8 K/uL (ref 19.0–186.0)
Retic Ct Pct: 2.5 % (ref 0.4–3.1)

## 2023-12-13 LAB — HEMOGLOBIN A1C
Hgb A1c MFr Bld: 5.8 % — ABNORMAL HIGH (ref 4.8–5.6)
Mean Plasma Glucose: 119.76 mg/dL

## 2023-12-13 LAB — IRON AND TIBC
Iron: 74 ug/dL (ref 28–170)
Saturation Ratios: 16 % (ref 10.4–31.8)
TIBC: 452 ug/dL — ABNORMAL HIGH (ref 250–450)
UIBC: 378 ug/dL

## 2023-12-13 LAB — TSH: TSH: 0.205 u[IU]/mL — ABNORMAL LOW (ref 0.350–4.500)

## 2023-12-13 MED ORDER — LIVING BETTER WITH HEART FAILURE BOOK: Freq: Once | Status: AC

## 2023-12-13 MED ORDER — ASPIRIN 81 MG PO CHEW
81.0000 mg | CHEWABLE_TABLET | ORAL | Status: AC
  Administered 2023-12-14: 81 mg via ORAL
  Filled 2023-12-13: qty 1

## 2023-12-13 MED ORDER — IPRATROPIUM-ALBUTEROL 0.5-2.5 (3) MG/3ML IN SOLN
3.0000 mL | RESPIRATORY_TRACT | Status: DC | PRN
  Administered 2023-12-14 – 2023-12-15 (×2): 3 mL via RESPIRATORY_TRACT
  Filled 2023-12-13 (×2): qty 3

## 2023-12-13 MED ORDER — FREE WATER
250.0000 mL | Freq: Once | Status: AC
  Administered 2023-12-14: 250 mL via ORAL

## 2023-12-13 MED ORDER — MELATONIN 5 MG PO TABS
5.0000 mg | ORAL_TABLET | Freq: Once | ORAL | Status: AC
  Administered 2023-12-13: 5 mg via ORAL
  Filled 2023-12-13: qty 1

## 2023-12-13 MED ORDER — PERFLUTREN LIPID MICROSPHERE
1.0000 mL | INTRAVENOUS | Status: AC | PRN
  Administered 2023-12-13: 2 mL via INTRAVENOUS

## 2023-12-13 NOTE — Hospital Course (Signed)
 Tabitha Wilcox is a 68 y.o. female with past medical history of seasonal allergies, anxiety, depression, asthma, bilateral carpal tunnel syndrome, GERD, right rotator cuff injury, sciatica,  PUD presented to hospital with progressive shortness of breath and dyspnea that woke her up from sleep.  She was recently seen at an urgent care center due to pulmonary edema and was given a furosemide prescription.  As per EMS patient was satting in the low 90s at home.  In the ED patient was tachypneic tachycardic and slightly hypertensive.  Labs with essentially within normal limits.  proBNP elevated at 350. 2 view chest radiograph showing cardiomegaly with worsening vascular congestion and moderate diffuse interstitial edema.  Increased perihilar opacity in both lungs which are most likely alveolar edema.  Bilateral small pleural effusions.  Patient was given 20 mg of IV Lasix 40 mg, KCl 40 mEq p.o. x 1 dose and was admitted to hospital for further evaluation and treatment..  New onset of congestive heart failure  Continue intake and output charting Daily weights fluid and salt restriction.  Has been started on lisinopril 2.5 mg daily.  Beta-blocker on hold.  Check 2D echocardiogram.  Check cardiology input.    Asthma, chronic/emphysema Continue supplemental oxygen  Solu-Medrol  followed by prednisone  and bronchodilators     GERD (gastroesophageal reflux disease) Can consider PPI if needed   History of tobacco abuse Not smoked in last 3 years.     Alcohol dependence in remission  Drinks alcohol occasionally.  Will continue to monitor.     Pre-diabetes Monitor blood glucose levels while on steroids.     Class 1 obesity Body mass index is 30.61 kg/m.  Emphasized on lifestyle modification.   Normochromic anemia Continue to monitor.  No obvious bleeding     Hyperlipidemia Continue Lipitor     Glaucoma Continue timolol  and Xalatan  drops.  Follow-up with pulmonary as outpatient.

## 2023-12-13 NOTE — Plan of Care (Signed)
°  Problem: Education: Goal: Ability to demonstrate management of disease process will improve Outcome: Progressing   Problem: Education: Goal: Ability to verbalize understanding of medication therapies will improve Outcome: Progressing   Problem: Education: Goal: Individualized Educational Video(s) Outcome: Progressing

## 2023-12-13 NOTE — Progress Notes (Signed)
 PROGRESS NOTE  TEHILA SOKOLOW FMW:[id number] DOB: [birth year] DOA: 12/12/2023 PCP: Shelda Atlas, MD   LOS: 0 days   Brief narrative:  Tabitha Wilcox is a 68 y.o. female with past medical history of seasonal allergies, anxiety, depression, asthma, bilateral carpal tunnel syndrome, GERD, right rotator cuff injury, sciatica,  PUD presented to hospital with progressive shortness of breath and dyspnea that woke her up from sleep.  She was recently seen at an urgent care center due to pulmonary edema and was given a furosemide prescription.  As per EMS, patient was satting in the low 90s at home.  In the ED patient was tachypneic tachycardic and slightly hypertensive.  Labs with essentially within normal limits.  proBNP elevated at 350. 2 view chest radiograph showing cardiomegaly with worsening vascular congestion and moderate diffuse interstitial edema.  Increased perihilar opacity in both lungs which are most likely alveolar edema.  Bilateral small pleural effusions.  Patient was given 20 mg of IV Lasix 40 mg, KCl 40 mEq p.o. x 1 dose and was admitted to hospital for further evaluation and treatment..  Assessment/Plan: Principal Problem:   New onset of congestive heart failure (HCC) Active Problems:   Asthma, chronic   GERD (gastroesophageal reflux disease)   Tobacco abuse   Alcohol dependence in remission (HCC)   Pre-diabetes   Class 1 obesity   Normochromic anemia   Hyperlipidemia   Glaucoma   New onset of congestive heart failure  Patient had dyspnea on exertion orthopnea and decrease exercise tolerance.  Continue intake and output charting Daily weights fluid and salt restriction.   Beta-blocker on hold.  Check 2D echocardiogram.  Check cardiology input.    Asthma, chronic/emphysema Continue supplemental oxygen  Solu-Medrol  followed by prednisone  and bronchodilators     GERD (gastroesophageal reflux disease) Can consider PPI if needed   History of tobacco abuse Not smoked in last 3  years.     Alcohol dependence in remission  Drinks alcohol occasionally.  Will continue to monitor.     Pre-diabetes Monitor blood glucose levels while on steroids.     Class 1 obesity Body mass index is 30.61 kg/m.  Emphasized on lifestyle modification.    Normochromic anemia Continue to monitor.  No obvious bleeding     Hyperlipidemia Continue Lipitor     Glaucoma Continue timolol  and Xalatan  drops.  Follow-up with pulmonary as outpatient.  DVT prophylaxis: SCDs Start: 12/12/23 0758   Disposition: Home likely in 1 to 2 days  Status is: Observation The patient will require care spanning > 2 midnights and should be moved to inpatient because: IV diuretics, new heart failure, pending clinical improvement    Code Status:     Code Status: Full Code  Family Communication: None at bedside  Consultants: Cardiology  Procedures: None  Anti-infectives:  None  Anti-infectives (From admission, onward)    None       Subjective: Today, patient was seen and examined at bedside.  Patient complains of shortness of breath and congestion better than when she came in denies any chest pain nausea vomiting.  Has dyspnea on exertion.  Has been urinating okay.  Objective: Vitals:   12/13/23 0503 12/13/23 0752  BP: (!) 165/97   Pulse: 71   Resp: 16   Temp: 97.9 F (36.6 C)   SpO2: 95% 97%    Intake/Output Summary (Last 24 hours) at 12/13/2023 1232 Last data filed at 12/13/2023 0951 Gross per 24 hour  Intake 360 ml  Output 1600 ml  Net -1240 ml   Filed Weights   12/13/23 0615  Weight: 73.5 kg   Body mass index is 30.61 kg/m.   Physical Exam:  GENERAL: Patient is alert awake and oriented. Not in obvious distress.  On room air, HENT: No scleral pallor or icterus. Pupils equally reactive to light. Oral mucosa is moist NECK: is supple, no gross swelling noted. CHEST: Decreased breath sounds bilaterally.  Coarse breath sounds noted.   CVS: S1 and S2 heard, no  murmur. Regular rate and rhythm.  ABDOMEN: Soft, non-tender, bowel sounds are present. EXTREMITIES: No edema. CNS: Cranial nerves are intact. No focal motor deficits. SKIN: warm and dry without rashes.  Data Review: I have personally reviewed the following laboratory data and studies,  CBC: Recent Labs  Lab 12/12/23 0619 12/13/23 0456  WBC 8.9 12.3*  HGB 10.2* 10.9*  HCT 32.3* 35.0*  MCV 91.8 89.5  PLT 370 452*   Basic Metabolic Panel: Recent Labs  Lab 12/12/23 0619 12/13/23 0456  NA 142 136  K 3.7 4.5  CL 104 100  CO2 26 23  GLUCOSE 137* 128*  BUN 12 21  CREATININE 0.70 0.84  CALCIUM  9.3 10.2   Liver Function Tests: Recent Labs  Lab 12/13/23 0456  AST 28  ALT 21  ALKPHOS 113  BILITOT 0.7  PROT 7.9  ALBUMIN 4.7   No results for input(s): LIPASE, AMYLASE in the last 168 hours. No results for input(s): AMMONIA in the last 168 hours. Cardiac Enzymes: No results for input(s): CKTOTAL, CKMB, CKMBINDEX, TROPONINI in the last 168 hours. BNP (last 3 results) No results for input(s): BNP in the last 8760 hours.  ProBNP (last 3 results) Recent Labs    12/12/23 0619  PROBNP 350.0*    CBG: No results for input(s): GLUCAP in the last 168 hours. No results found for this or any previous visit (from the past 240 hours).   Studies: DG Chest 2 View Result Date: 12/12/2023 CLINICAL DATA:  Shortness of breath and coughing. EXAM: CHEST - 2 VIEW COMPARISON:  Chest PA and lateral 04/10/2023, 12/08/2022. FINDINGS: Cardiomegaly. There is worsening central vascular congestion, flow cephalization and increased moderate interstitial edema. There are increased perihilar opacities in both lungs which are most likely alveolar edema. Pneumonic component not strictly excluded. Small pleural effusions are forming, but not significantly changed. The mediastinum is normally outlined. No new osseous findings.  Mild thoracic spondylosis. IMPRESSION: 1. Cardiomegaly with  worsening vascular congestion and moderate diffuse interstitial edema. 2. Increased perihilar opacities in both lungs which are most likely alveolar edema. Pneumonic component not strictly excluded. 3. Small pleural effusions. Electronically Signed   By: Francis Quam M.D.   On: 12/12/2023 06:50      Vernal Alstrom, MD  [facility] 12/13/2023  If 7PM-7AM, please contact night-coverage

## 2023-12-13 NOTE — Consult Note (Addendum)
 Cardiology Consultation   Patient ID: Tabitha Wilcox MRN: [id number]; DOB: Feb 25, 1956  Admit date: 12/12/2023 Date of Consult: 12/13/2023  PCP:  Tabitha Atlas, MD   [facility] Providers Cardiologist:  New Click here to update MD or APP on Care Team, Refresh:1}     Patient Profile: Tabitha Wilcox is a 68 y.o. adult with a hx of borderline DM, HLD, elevated blood pressure without dx of HTN, anxiety, depression, remote mild bilateral carpal tunnel, asthma, GERD, sciatica, tobacco abuse, remote PUD, former tobacco use, recent cessation of vaping who is being seen 12/13/2023 for the evaluation of CHF at the request of Dr. Celinda.  History of Present Illness: [name] has no formal cardiac hx. ABIs by PCP in 2023 were normal. She reports she has history of borderline DM. She states at last PCP visit they had to check BP twice because it was slightly higher than prior but never been formally dx with HTN. SBP was 130s-140s at discharge during 12/2022 admission for knee surgery. She reports PCP previously wanted to start med for HLD but she preferred to work on lifestyle. She previously smoked cigarettes and quit 3 years ago. She also recently quit vaping. She drinks 1-2 Smirnoff cans 4x/week. Her maternal grandmother had heart disease (unknown), paternal grandfather had CHF (unknown age), paternal uncle died of MI in his 56s, and paternal cousin had CHF in his 47s.  Originally she reported that her symptoms began around the time of her Covid vaccine ~3 weeks ago but now that she has had time to reflect, she now believes she has been experiencing progressive SOB/DOE for several months. She has several steps/hill to access her home and has been gradually finding it harder to do so with her breathing. She originally though it was part of her recovery from her orthopedic issues or asthma. She has also had orthopnea but no CP, vomiting, syncope, or edema. She was recently seen at [facility] 10/1 after  presenting with worsening SOB. She attributed onset to around the time she got her Covid vaccine. She was tachycardic at 115 and with BP 149/89. CXR showed atherosclerotic plaque and concern for pulmonary edema. She was prescribed Lasix with recommendations for close PCP f/u. She took the Lasix but didn't see much change in UOP. She developed SOB that woke her out of sleep on 10/5 prompting her to call EMS. Upon EMS arrival, POx was low 90s. She received albuterol , atrovent, magnesium  and solu-medrol . CXR shows cardiomegaly with worsening vascular congestion, moderate diffuse interstitial edema, increased perihilar opacities likely alveolar edema (pneumonic component not strictly excluded), small pleural effusions. BP 162/85. Labs show pBNP 350, hsTroponin neg, normal Cr/LFTs, Hgb 10.2-10.9, normal WBC with mild leukocytosis after steroids. 2d echo in process. She was given nebs, 20mg  IV Lasix x3 doses so far, 2.5mg  lisinopril, 40meq KCl, and Tylenol . She reports robust UOP from the IV Lasix thus far, had to initially defer interview for bathroom break. She is still somewhat SOB with ambulation but reports nothing like when she first arrived.  Past Medical History:  Diagnosis Date   Allergy    Anxiety    Arthritis    Asthma    and bronchitisl reprots her sob is due to her asthma    Carpal tunnel syndrome on both sides    Depression    GERD (gastroesophageal reflux disease)    Obesity 07/20/2014   Rotator cuff injury    Right side   Sciatica    Shortness of breath  sob with exertion    Tobacco abuse    Ulcer    1990's    Past Surgical History:  Procedure Laterality Date   APPENDECTOMY     APPLICATION OF ROBOTIC ASSISTANCE FOR SPINAL PROCEDURE N/A 11/02/2016   Procedure: APPLICATION OF ROBOTIC ASSISTANCE FOR SPINAL PROCEDURE;  Surgeon: Ditty, Morene Hicks, MD;  Location: [facility] OR;  Service: Neurosurgery;  Laterality: N/A;   EXPLORATORY LAPAROTOMY     reports they puntured something when  they were taking the appendix , and had to go back and put in a drain     IRRIGATION AND DEBRIDEMENT ABSCESS Right 07/05/2012   Procedure: IRRIGATION AND DEBRIDEMENT RIGHT LABIAL/THIGH ABSCESS;  Surgeon: Tabitha DELENA Poli, MD;  Location: [facility] OR;  Service: General;  Laterality: Right;   left hip replacement      SHOULDER ARTHROSCOPY W/ ROTATOR CUFF REPAIR Right 2015   TOTAL KNEE ARTHROPLASTY Left 12/30/2018   Procedure: TOTAL KNEE ARTHROPLASTY;  Surgeon: Tabitha Rush, MD;  Location: [facility] ORS;  Service: Orthopedics;  Laterality: Left;   TOTAL KNEE ARTHROPLASTY Right 12/21/2022   Procedure: TOTAL KNEE ARTHROPLASTY;  Surgeon: Tabitha Rush, MD;  Location: [facility] ORS;  Service: Orthopedics;  Laterality: Right;   UPPER GASTROINTESTINAL ENDOSCOPY       Home Medications:  Prior to Admission medications   Medication Sig Start Date End Date Taking? Authorizing Provider  albuterol  (Tabitha Wilcox  HFA) 108 (90 Base) MCG/ACT inhaler Inhale 1-2 puffs into the lungs every 6 (six) hours as needed for wheezing or shortness of breath. 08/19/16  Yes Tabitha Darron GAILS, MD  albuterol  (PROVENTIL ) (2.5 MG/3ML) 0.083% nebulizer solution Take 3 mLs (2.5 mg total) by nebulization every 6 (six) hours as needed for wheezing or shortness of breath. 07/30/16  Yes Tabitha Darron GAILS, MD  atorvastatin (LIPITOR) 20 MG tablet Take 20 mg by mouth at bedtime. 10/29/23  Yes [provider]  Calcium  Carbonate-Vitamin D  (CALCIUM  600+D PO) Take 1 tablet by mouth every other day.   Yes [provider]  diclofenac  Sodium (VOLTAREN ) 1 % GEL Apply 2 g topically 4 (four) times daily. Patient taking differently: Apply 2 g topically daily as needed (joint pain). 07/07/19  Yes Tabitha Aleck SAILOR, MD  fluticasone  (FLONASE ) 50 MCG/ACT nasal spray Place 2 sprays into both nostrils in the morning and at bedtime. 07/20/23  Yes [provider]  gabapentin  (NEURONTIN ) 600 MG tablet Take 600 mg by mouth 2 (two) times daily. 06/19/22  Yes  [provider]  Anselm Oil 350 MG CAPS Take 350 mg by mouth every other day.   Yes [provider]  latanoprost  (XALATAN ) 0.005 % ophthalmic solution Place 1 drop into both eyes at bedtime. 11/25/22  Yes [provider]  MAGNESIUM  PO Take 1 tablet by mouth at bedtime.   Yes [provider]  meloxicam  (MOBIC ) 15 MG tablet Take 15 mg by mouth daily. 11/22/23  Yes [provider]  Multiple Vitamin (MULTIVITAMIN WITH MINERALS) TABS tablet Take 1 tablet by mouth every other day. Centrum   Yes [provider]  omeprazole  (PRILOSEC) 20 MG capsule Take 1 capsule (20 mg total) by mouth daily before lunch. 03/12/16  Yes Tabitha Darron GAILS, MD  OVER THE COUNTER MEDICATION Take 1 capsule by mouth daily. Sea moss capsules   Yes [provider]  timolol  (TIMOPTIC ) 0.25 % ophthalmic solution Place 1 drop into both eyes every morning. 10/14/22  Yes [provider]  tiZANidine  (ZANAFLEX ) 4 MG tablet Take 2-4 mg by mouth  See admin instructions. Take 2 mg by mouth in the morning and 4 mg by mouth at bedtime. 11/27/22  Yes [provider]  triamcinolone cream (KENALOG) 0.5 % Apply 1 Application topically 2 (two) times daily as needed (rash). 10/11/22  Yes [provider]  amoxicillin  (AMOXIL ) 500 MG tablet Take 500 mg by mouth 2 (two) times daily. Patient not taking: Reported on 12/12/2023 09/07/23   [provider]  celecoxib  (CELEBREX ) 200 MG capsule Take 200 mg by mouth 2 (two) times daily. Patient not taking: Reported on 12/12/2023 09/07/17   [provider]  furosemide (LASIX) 20 MG tablet Take 1 tablet (20 mg total) by mouth every morning for 3 days. Patient not taking: Reported on 12/12/2023 12/08/23 12/11/23  Iola Lukes, FNP  traMADol  (ULTRAM ) 50 MG tablet Take 50 mg by mouth every 8 (eight) hours as needed for moderate pain. Patient not taking: Reported on 12/12/2023 11/21/22   [provider]    Scheduled  Meds:  atorvastatin  20 mg Oral QHS   furosemide  20 mg Intravenous BID   ipratropium-albuterol   3 mL Nebulization TID   predniSONE   40 mg Oral Q breakfast   Continuous Infusions:  PRN Meds: acetaminophen  **OR** acetaminophen , camphor-menthol , ondansetron  **OR** ondansetron  (ZOFRAN ) IV, perflutren lipid microspheres (DEFINITY) IV suspension  Allergies:    Allergies  Allergen Reactions   Hydrocodone  Itching   Oxycodone  Itching    Social History:   Social History   Socioeconomic History   Marital status: Divorced    Spouse name: Not on file   Number of children: 3   Years of education: Not on file   Highest education level: Not on file  Occupational History   Occupation: Unemployed   Tobacco Use   Smoking status: Former    Current packs/day: 0.25    Average packs/day: 0.3 packs/day for 25.0 years (6.3 ttl pk-yrs)    Types: Cigarettes   Smokeless tobacco: Never   Tobacco comments:    Currently smoking less than 1/4  pack per day depending on how stressed she is.. 12-23-2018 confirmd the previous statement   Vaping Use   Vaping status: Former  Substance and Sexual Activity   Alcohol use: Yes    Comment: Martini sometimes.(1 a day) wine cooler   Drug use: No    Comment: stopped since 2 1/2 years. Before using Marijuana.    Sexual activity: Not Currently  Other Topics Concern   Not on file  Social History Narrative   Occupation: Worked in Physicist, medical.  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       Daughter, 3   Son, 96   Son, 38 (Deceased, shot at fathers home in 2007)   Does not exercise anymore due to foot/sciatica pain      Social Drivers of Corporate investment banker Strain: Not on file  Food Insecurity: No Food Insecurity (12/12/2023)   Hunger Vital Sign    Worried About Running Out of Food in the Last Year: Never true    Ran Out of Food in the  Last Year: Never true  Transportation Needs: No Transportation Needs (12/12/2023)   PRAPARE - Administrator, Civil Service (Medical): No    Lack of Transportation (Non-Medical): No  Physical Activity: Not on file  Stress: Not on file  Social Connections: Patient Declined (12/12/2023)   Social Connection and Isolation Panel  Frequency of Communication with Friends and Family: Patient declined    Frequency of Social Gatherings with Friends and Family: Patient declined    Attends Religious Services: Patient declined    Database administrator or Organizations: Patient declined    Attends Banker Meetings: Patient declined    Marital Status: Patient declined  Intimate Partner Violence: Not At Risk (12/12/2023)   Humiliation, Afraid, Rape, and Kick questionnaire    Fear of Current or Ex-Partner: No    Emotionally Abused: No    Physically Abused: No    Sexually Abused: No    Family History:   Family History  Problem Relation Age of Onset   Colon cancer Maternal Aunt    Colon cancer Paternal Uncle    Esophageal cancer Neg Hx    Rectal cancer Neg Hx    Stomach cancer Neg Hx      ROS:  Please see the history of present illness.  All other ROS reviewed and negative.     Physical Exam/Data: Vitals:   12/12/23 2018 12/13/23 0503 12/13/23 0615 12/13/23 0752  BP:  (!) 165/97    Pulse:  71    Resp:  16    Temp:  97.9 F (36.6 C)    TempSrc:  Oral    SpO2: 96% 95%  97%  Weight:   73.5 kg   Height:   5' 1 (1.549 m)     Intake/Output Summary (Last 24 hours) at 12/13/2023 0929 Last data filed at 12/13/2023 0505 Gross per 24 hour  Intake 360 ml  Output 1200 ml  Net -840 ml      12/13/2023    6:15 AM 12/08/2023    3:51 PM 12/21/2022    8:08 AM  Last 3 Weights  Weight (lbs) 162 lb 182 lb 1.6 oz 182 lb  Weight (kg) 73.483 kg 82.6 kg 82.555 kg     Body mass index is 30.61 kg/m.  General: Well developed, well nourished, in no acute distress. Head:  Normocephalic, atraumatic, sclera non-icteric, no xanthomas, nares are without discharge. Neck: Negative for carotid bruits. JVP not elevated. Lungs: Diffusely diminished bilaterally to auscultation without wheezes, rales, or rhonchi. Breathing is unlabored. Heart: RRR S1 S2 without murmurs, rubs, or gallops.  Abdomen: Soft, non-tender, non-distended with normoactive bowel sounds. No rebound/guarding. Extremities: No clubbing or cyanosis. No edema. Distal pedal pulses are 2+ and equal bilaterally. Neuro: Alert and oriented X 3. Moves all extremities spontaneously. Psych:  Responds to questions appropriately with a normal affect.   EKG:  The EKG was personally reviewed and demonstrates:  EKG shows sinus tach with new development of LBBB 105bpm with nonspecific STTW changes (previously narrow complex 12/2022) Telemetry:  Telemetry was personally reviewed and demonstrates:  sinus tach/NSR  Relevant CV Studies: None  Laboratory Data: High Sensitivity Troponin:  No results for input(s): TROPONINIHS in the last 720 hours.   Chemistry Recent Labs  Lab 12/12/23 0619 12/13/23 0456  NA 142 136  K 3.7 4.5  CL 104 100  CO2 26 23  GLUCOSE 137* 128*  BUN 12 21  CREATININE 0.70 0.84  CALCIUM  9.3 10.2  GFRNONAA >60 >60  ANIONGAP 13 13    Recent Labs  Lab 12/13/23 0456  PROT 7.9  ALBUMIN 4.7  AST 28  ALT 21  ALKPHOS 113  BILITOT 0.7   Lipids No results for input(s): CHOL, TRIG, HDL, LABVLDL, LDLCALC, CHOLHDL in the last 168 hours.  Hematology Recent Labs  Lab 12/12/23 [phone]  12/13/23 0456  WBC 8.9 12.3*  RBC 3.52* 3.91  HGB 10.2* 10.9*  HCT 32.3* 35.0*  MCV 91.8 89.5  MCH 29.0 27.9  MCHC 31.6 31.1  RDW 13.9 14.0  PLT 370 452*   Thyroid No results for input(s): TSH, FREET4 in the last 168 hours.  BNP Recent Labs  Lab 12/12/23 0619  PROBNP 350.0*    DDimer No results for input(s): DDIMER in the last 168 hours.  Radiology/Studies:  DG Chest 2  View Result Date: 12/12/2023 CLINICAL DATA:  Shortness of breath and coughing. EXAM: CHEST - 2 VIEW COMPARISON:  Chest PA and lateral 04/10/2023, 12/08/2022. FINDINGS: Cardiomegaly. There is worsening central vascular congestion, flow cephalization and increased moderate interstitial edema. There are increased perihilar opacities in both lungs which are most likely alveolar edema. Pneumonic component not strictly excluded. Small pleural effusions are forming, but not significantly changed. The mediastinum is normally outlined. No new osseous findings.  Mild thoracic spondylosis. IMPRESSION: 1. Cardiomegaly with worsening vascular congestion and moderate diffuse interstitial edema. 2. Increased perihilar opacities in both lungs which are most likely alveolar edema. Pneumonic component not strictly excluded. 3. Small pleural effusions. Electronically Signed   By: Francis Quam M.D.   On: 12/12/2023 06:50     Assessment and Plan:  1. Acute CHF, type unknown - presenting with subacute worsening x3 weeks superimposed on severa months of DOE, orthopnea, decreased exercise endurance  - 2d echo pending to determine systolic or diastolic - also note family history of both CAD + CHF, bilateral carpal tunnel (remote issue), LBBB, habitual ETOH use, and new formal diagnosis of HTN this admission, so etiology in question - HIV, TSH pending - reports she is experiencing robust UOP with IV Lasix - OK to continue for now, can titrate dose further if diuresis stalls - hold off on BB given acute decompensation - with asthma anticipate selective BB once chosen - will d/c lisinopril 2.5mg  daily as she may need ARB/Entresto pathway instead (if changing to Entresto, will need washout period - had lisinopril dose yesterday AM) - initial troponin negative but 2nd one never resulted yesterday along with Mg/phos - care order placed to have nurse call lab to inquire to ensure this is completed; will repeat with blood drawn  today - pending further review with MD  2. Essential HTN (new formal dx) - anticipate managing in context above - check TSH with labs - otherwise ?needs w/u for secondary HTN given relatively new onset  3. LBBB - new finding, no bradycardia seen - echo pending - no chest pain  4. Normocytic anemia - present in 06/2022 as well, but variable in the past - check anemia panel - no bleeding reported  5. HLD, aortic atherosclerosis - previous LDL 125 in 06/2022 - check lipid panel and Lp(a) with AM labs  - atorvastatin 20mg  daily added by primary team, can consider dose titration if CAD discovered in workup  6. Pre DM - check A1C with labs  7. Asthma - per primary team   Risk Assessment/Risk Scores:       [place]  Heart Association (NYHA) Functional Class NYHA Class IV on arrival  For questions or updates, please contact [facility] Please consult [email or web address] for contact info under      Signed, Dayna N Dunn, PA-C  12/13/2023 9:29 AM  I have seen and examined the patient along with Dayna N Dunn, PA-C .  I have reviewed the chart, notes and new data.  I agree  with PA/NP's note.  Key new complaints: Shortness of breath has improved with diuretics and excellent urine output.  She has not had any chest pain. Key examination changes: Mildly obese, mildly distended jugular veins (6-8 cm), regular rate and rhythm with paradoxically split second heart sound and no significant murmurs or rubs.  No peripheral edema.  Programming was consistent with pseudo normal mitral inflow/elevated mean left atrial pressure. Key new findings / data: LBBB is new compared to ECG from less a year ago.  Reviewed her echocardiogram (full report to follow): There is global left ventricular hypokinesis with EF around 40% and profound LBBB related dyssynchrony.  The left atrium is dilated.  Right ventricular function is normal and there are no meaningful valve abnormalities.  There is no left  ventricular thrombus. Mildly elevated total cholesterol and triglycerides and hemoglobin A1c 6.0 consistent with insulin resistance/metabolic syndrome, her HDL is substantially lower than it has been in the past but is still not bad at 56. Mildly suppressed TSH, full thyroid panel to follow (could be sick euthyroid syndrome).  Doubt thyrotoxic cardiomyopathy.  Note remote toxicology positive for cocaine in 2017.  PLAN: Dilated cardiomyopathy of uncertain etiology.  LBBB is causing substantial dyssynchrony.   Untreated hypertension may be part of the problem, but her blood pressure has been relatively mildly elevated and only in the recent past (about a year).  Moderately, alcohol use may be playing a role, but she reports drinking 5 or 6 coolers a week, probably not enough to cause alcoholic cardiomyopathy.  She reports having a problem with cocaine use 80 years ago, but has been clean for several years.    We need need to exclude significant coronary insufficiency.  I think it is reasonable to perform right and left heart catheterization tomorrow.  Still has evidence of volume overload by echo, even though she has improved clinically.  However she is able to lie fully flat in bed at this time and will likely diurese further until tomorrow.  Informed Consent   Shared Decision Making/Informed Consent The risks [stroke (1 in 1000), death (1 in 1000), kidney failure [usually temporary] (1 in 500), bleeding (1 in 200), allergic reaction [possibly serious] (1 in 200)], benefits (diagnostic support and management of coronary artery disease) and alternatives of a cardiac catheterization were discussed in detail with Ms. Bagg and she is willing to proceed.     Plan treatment with Entresto needs 24-hour washout of lisinopril), carvedilol, spironolactone, SGLT2 inhibitor.  Will plan to initiate most of these after contrast exposure with her heart catheterization.    Jerel Balding, MD, [facility] [phone] 12/13/2023, 12:12 PM

## 2023-12-13 NOTE — TOC Initial Note (Signed)
 Transition of Care [facility]) - Initial/Assessment Note    Patient Details  Name: Tabitha Wilcox DOBY MRN: [id number] Date of Birth: Jul 15, 1955  Transition of [facility]) CM/SW Contact:    Bascom Service, RN Phone Number: 12/13/2023, 9:53 AM  Clinical Narrative: d/c plan home. Has own transport home.                  Expected Discharge Plan: Home/Self Care Barriers to Discharge: Continued Medical Work up   Patient Goals and CMS Choice Patient states their goals for this hospitalization and ongoing recovery are:: Home CMS Medicare.gov Compare Post Acute Care list provided to:: Patient Choice offered to / list presented to : Patient [facility] ownership interest in [facility].provided to:: Patient    Expected Discharge Plan and Services   Discharge Planning Services: CM Consult   Living arrangements for the past 2 months: Single Family Home                                      Prior Living Arrangements/Services Living arrangements for the past 2 months: Single Family Home Lives with:: Self   Do you feel safe going back to the place where you live?: Yes          Current home services: DME (cane, rw)    Activities of Daily Living   ADL Screening (condition at time of admission) Independently performs ADLs?: No Does the patient have a NEW difficulty with bathing/dressing/toileting/self-feeding that is expected to last >3 days?: No Does the patient have a NEW difficulty with getting in/out of bed, walking, or climbing stairs that is expected to last >3 days?: No Does the patient have a NEW difficulty with communication that is expected to last >3 days?: No Is the patient deaf or have difficulty hearing?: No Does the patient have difficulty seeing, even when wearing glasses/contacts?: No Does the patient have difficulty concentrating, remembering, or making decisions?: No  Permission Sought/Granted Permission sought to share information with : Case Manager                 Emotional Assessment              Admission diagnosis:  SOB (shortness of breath) [R06.02] New onset of congestive heart failure (HCC) [I50.9] Patient Active Problem List   Diagnosis Date Noted   New onset of congestive heart failure (HCC) 12/12/2023   Class 1 obesity 12/12/2023   Normochromic anemia 12/12/2023   Hyperlipidemia 12/12/2023   Glaucoma 12/12/2023   Foot drop, right 12/23/2022   Arthritis of right knee 12/21/2022   Primary osteoarthritis of left knee 12/30/2018   Spondylolisthesis of lumbosacral region 11/02/2016   Strain of right upper arm 07/30/2016   Hepatitis C antibody test positive 02/08/2015   Thoracic arthritis 12/21/2014   DJD (degenerative joint disease) of knee 10/20/2014   Obesity 07/20/2014   Pre-diabetes 07/20/2014   Insomnia 07/20/2014   Cervical spine arthritis (HCC) 03/16/2014   Family history of glaucoma 10/13/2013   Toenail deformity 06/14/2013   Osteopenia 05/08/2013   Healthcare maintenance 12/19/2012   Alcohol dependence in remission (HCC) 08/15/2012   Special screening for malignant neoplasms, colon 08/19/2011   Medial meniscus tear 07/14/2011   Tobacco abuse 04/13/2011   Knee pain, bilateral 03/31/2011   Ankle pain 03/12/2011   Esophageal stricture 03/12/2011   Asthma, chronic 12/26/2010   GERD (gastroesophageal reflux disease) 12/26/2010  Carpal tunnel syndrome on both sides    Rotator cuff injury     lumar surgery  PLIF L5-S1 02/08/2009   DEPRESSION 12/27/2008   PCP:  Shelda Atlas, MD Pharmacy:   [facility] [address], [place] - [address] [place] [address] Phone: [phone] Fax: [phone]     Social Drivers of Health (SDOH) Social History: SDOH Screenings   Food Insecurity: No Food Insecurity (12/12/2023)  Housing: Low Risk  (12/12/2023)  Transportation Needs: No Transportation Needs (12/12/2023)  Utilities: Not At Risk (12/12/2023)  Social  Connections: Patient Declined (12/12/2023)  Tobacco Use: Medium Risk (12/12/2023)   SDOH Interventions:     Readmission Risk Interventions     No data to display

## 2023-12-14 ENCOUNTER — Encounter (HOSPITAL_COMMUNITY)
Admission: EM | Disposition: A | Discharge status: Home / Self Care | Payer: Self-pay | Source: Home / Self Care | Attending: Internal Medicine

## 2023-12-14 DIAGNOSIS — I509 Heart failure, unspecified: Secondary | ICD-10-CM | POA: Diagnosis not present

## 2023-12-14 DIAGNOSIS — I5021 Acute systolic (congestive) heart failure: Secondary | ICD-10-CM

## 2023-12-14 LAB — CBC
HCT: 34.4 % — ABNORMAL LOW (ref 36.0–46.0)
Hemoglobin: 11.1 g/dL — ABNORMAL LOW (ref 12.0–15.0)
MCH: 29 pg (ref 26.0–34.0)
MCHC: 32.3 g/dL (ref 30.0–36.0)
MCV: 89.8 fL (ref 80.0–100.0)
Platelets: 447 K/uL — ABNORMAL HIGH (ref 150–400)
RBC: 3.83 MIL/uL — ABNORMAL LOW (ref 3.87–5.11)
RDW: 14.1 % (ref 11.5–15.5)
WBC: 10.8 K/uL — ABNORMAL HIGH (ref 4.0–10.5)
nRBC: 0 % (ref 0.0–0.2)

## 2023-12-14 LAB — LIPID PANEL
Cholesterol: 191 mg/dL (ref 0–200)
HDL: 86 mg/dL (ref >40)
LDL Cholesterol: 76 mg/dL (ref 0–99)
Total CHOL/HDL Ratio: 2.2 ratio
Triglycerides: 146 mg/dL (ref <150)
VLDL: 29 mg/dL (ref 0–40)

## 2023-12-14 LAB — BASIC METABOLIC PANEL WITH GFR
Anion gap: 13 (ref 5–15)
BUN: 26 mg/dL — ABNORMAL HIGH (ref 8–23)
CO2: 24 mmol/L (ref 22–32)
Calcium: 10.1 mg/dL (ref 8.9–10.3)
Chloride: 100 mmol/L (ref 98–111)
Creatinine, Ser: 0.8 mg/dL (ref 0.44–1.00)
GFR, Estimated: >60 mL/min (ref >60)
Glucose, Bld: 113 mg/dL — ABNORMAL HIGH (ref 70–99)
Potassium: 3.6 mmol/L (ref 3.5–5.1)
Sodium: 137 mmol/L (ref 135–145)

## 2023-12-14 LAB — POCT I-STAT EG7
Acid-Base Excess: 0 mmol/L (ref 0.0–2.0)
Bicarbonate: 24.1 mmol/L (ref 20.0–28.0)
Calcium, Ion: 1.24 mmol/L (ref 1.15–1.40)
HCT: 38 % (ref 36.0–46.0)
Hemoglobin: 12.9 g/dL (ref 12.0–15.0)
O2 Saturation: 66 %
Potassium: 4.9 mmol/L (ref 3.5–5.1)
Sodium: 137 mmol/L (ref 135–145)
TCO2: 25 mmol/L (ref 22–32)
pCO2, Ven: 37.3 mmHg — ABNORMAL LOW (ref 44–60)
pH, Ven: 7.419 (ref 7.25–7.43)
pO2, Ven: 34 mmHg (ref 32–45)

## 2023-12-14 LAB — T4, FREE: Free T4: 1.06 ng/dL (ref 0.61–1.12)

## 2023-12-14 LAB — MAGNESIUM: Magnesium: 2.6 mg/dL — ABNORMAL HIGH (ref 1.7–2.4)

## 2023-12-14 SURGERY — RIGHT/LEFT HEART CATH AND CORONARY ANGIOGRAPHY
Anesthesia: LOCAL

## 2023-12-14 MED ORDER — ENOXAPARIN SODIUM 40 MG/0.4ML IJ SOSY
40.0000 mg | PREFILLED_SYRINGE | INTRAMUSCULAR | Status: DC
  Administered 2023-12-15: 40 mg via SUBCUTANEOUS
  Filled 2023-12-14: qty 0.4

## 2023-12-14 MED ORDER — SACUBITRIL-VALSARTAN 24-26 MG PO TABS
1.0000 | ORAL_TABLET | Freq: Two times a day (BID) | ORAL | Status: DC
  Administered 2023-12-14 – 2023-12-15 (×2): 1 via ORAL
  Filled 2023-12-14 (×3): qty 1

## 2023-12-14 MED ORDER — HYDRALAZINE HCL 20 MG/ML IJ SOLN: 10.0000 mg | INTRAMUSCULAR | Status: AC | PRN

## 2023-12-14 MED ORDER — DAPAGLIFLOZIN PROPANEDIOL 10 MG PO TABS
10.0000 mg | ORAL_TABLET | Freq: Every day | ORAL | Status: DC
  Administered 2023-12-15: 10 mg via ORAL
  Filled 2023-12-14: qty 1

## 2023-12-14 MED ORDER — TIZANIDINE HCL 4 MG PO TABS
4.0000 mg | ORAL_TABLET | Freq: Every day | ORAL | Status: DC
  Administered 2023-12-14: 4 mg via ORAL
  Filled 2023-12-14: qty 1

## 2023-12-14 MED ORDER — PANTOPRAZOLE SODIUM 40 MG PO TBEC
40.0000 mg | DELAYED_RELEASE_TABLET | Freq: Every day | ORAL | Status: DC
Start: 2023-12-14 — End: 2023-12-15
  Administered 2023-12-15: 40 mg via ORAL
  Filled 2023-12-14: qty 1

## 2023-12-14 MED ORDER — MELATONIN 5 MG PO TABS
5.0000 mg | ORAL_TABLET | Freq: Once | ORAL | Status: AC
  Administered 2023-12-14: 5 mg via ORAL
  Filled 2023-12-14: qty 1

## 2023-12-14 MED ORDER — LIDOCAINE HCL (PF) 1 % IJ SOLN
INTRAMUSCULAR | Status: AC
Start: 2023-12-14 — End: 2023-12-14
  Filled 2023-12-14: qty 30

## 2023-12-14 MED ORDER — IOHEXOL 350 MG/ML SOLN
INTRAVENOUS | Status: DC | PRN
  Administered 2023-12-14: 35 mL

## 2023-12-14 MED ORDER — FLUTICASONE PROPIONATE 50 MCG/ACT NA SUSP
1.0000 | Freq: Two times a day (BID) | NASAL | Status: DC
  Administered 2023-12-14 – 2023-12-15 (×2): 1 via NASAL
  Filled 2023-12-14 (×2): qty 16

## 2023-12-14 MED ORDER — TIMOLOL MALEATE 0.5 % OP SOLN
1.0000 [drp] | Freq: Every day | OPHTHALMIC | Status: DC
  Administered 2023-12-15: 1 [drp] via OPHTHALMIC
  Filled 2023-12-14: qty 5

## 2023-12-14 MED ORDER — HEPARIN SODIUM (PORCINE) 1000 UNIT/ML IJ SOLN
INTRAMUSCULAR | Status: DC | PRN
  Administered 2023-12-14: 3500 [IU] via INTRAVENOUS

## 2023-12-14 MED ORDER — FENTANYL CITRATE (PF) 100 MCG/2ML IJ SOLN
INTRAMUSCULAR | Status: AC
  Filled 2023-12-14: qty 2

## 2023-12-14 MED ORDER — ADULT MULTIVITAMIN W/MINERALS CH
1.0000 | ORAL_TABLET | Freq: Every day | ORAL | Status: DC
Start: 2023-12-15 — End: 2023-12-15
  Administered 2023-12-15: 1 via ORAL
  Filled 2023-12-14: qty 1

## 2023-12-14 MED ORDER — SODIUM CHLORIDE 0.9 % IV SOLN: 250.0000 mL | INTRAVENOUS | Status: DC | PRN

## 2023-12-14 MED ORDER — GUAIFENESIN 100 MG/5ML PO LIQD
5.0000 mL | ORAL | Status: DC | PRN
  Administered 2023-12-14 (×3): 5 mL via ORAL
  Filled 2023-12-14 (×3): qty 10

## 2023-12-14 MED ORDER — CARVEDILOL 3.125 MG PO TABS
3.1250 mg | ORAL_TABLET | Freq: Two times a day (BID) | ORAL | Status: DC
  Administered 2023-12-14 – 2023-12-15 (×2): 3.125 mg via ORAL
  Filled 2023-12-14 (×2): qty 1

## 2023-12-14 MED ORDER — HEPARIN SODIUM (PORCINE) 1000 UNIT/ML IJ SOLN
INTRAMUSCULAR | Status: AC
  Filled 2023-12-14: qty 10

## 2023-12-14 MED ORDER — HEPARIN (PORCINE) IN NACL 1000-0.9 UT/500ML-% IV SOLN
INTRAVENOUS | Status: DC | PRN
  Administered 2023-12-14: 1000 mL via SURGICAL_CAVITY

## 2023-12-14 MED ORDER — OYSTER SHELL CALCIUM/D3 500-5 MG-MCG PO TABS
1.0000 | ORAL_TABLET | ORAL | Status: DC
  Administered 2023-12-15: 1 via ORAL
  Filled 2023-12-14: qty 1

## 2023-12-14 MED ORDER — FENTANYL CITRATE (PF) 100 MCG/2ML IJ SOLN
INTRAMUSCULAR | Status: DC | PRN
  Administered 2023-12-14: 25 ug via INTRAVENOUS

## 2023-12-14 MED ORDER — MIDAZOLAM HCL 2 MG/2ML IJ SOLN
INTRAMUSCULAR | Status: AC
  Filled 2023-12-14: qty 2

## 2023-12-14 MED ORDER — VERAPAMIL HCL 2.5 MG/ML IV SOLN
INTRAVENOUS | Status: DC | PRN
  Administered 2023-12-14: 10 mL via INTRA_ARTERIAL

## 2023-12-14 MED ORDER — VERAPAMIL HCL 2.5 MG/ML IV SOLN
INTRAVENOUS | Status: AC
  Filled 2023-12-14: qty 2

## 2023-12-14 MED ORDER — SODIUM CHLORIDE 0.9% FLUSH
3.0000 mL | Freq: Two times a day (BID) | INTRAVENOUS | Status: DC
  Administered 2023-12-14 – 2023-12-15 (×2): 3 mL via INTRAVENOUS

## 2023-12-14 MED ORDER — LIDOCAINE HCL (PF) 1 % IJ SOLN
INTRAMUSCULAR | Status: DC | PRN
  Administered 2023-12-14: 10 mL

## 2023-12-14 MED ORDER — MIDAZOLAM HCL 2 MG/2ML IJ SOLN
INTRAMUSCULAR | Status: DC | PRN
  Administered 2023-12-14: 1 mg via INTRAVENOUS

## 2023-12-14 MED ORDER — TIZANIDINE HCL 4 MG PO TABS
2.0000 mg | ORAL_TABLET | Freq: Every day | ORAL | Status: DC
  Administered 2023-12-15: 2 mg via ORAL
  Filled 2023-12-14: qty 1

## 2023-12-14 MED ORDER — LATANOPROST 0.005 % OP SOLN
1.0000 [drp] | Freq: Every day | OPHTHALMIC | Status: DC
Start: 2023-12-14 — End: 2023-12-15
  Administered 2023-12-14: 1 [drp] via OPHTHALMIC
  Filled 2023-12-14 (×2): qty 2.5

## 2023-12-14 MED ORDER — SODIUM CHLORIDE 0.9% FLUSH: 3.0000 mL | INTRAVENOUS | Status: DC | PRN

## 2023-12-14 MED ORDER — LABETALOL HCL 5 MG/ML IV SOLN: 10.0000 mg | INTRAVENOUS | Status: AC | PRN

## 2023-12-14 MED ORDER — FREE WATER
500.0000 mL | Freq: Once | Status: AC
  Administered 2023-12-14: 500 mL via ORAL

## 2023-12-14 MED ORDER — GABAPENTIN 300 MG PO CAPS
600.0000 mg | ORAL_CAPSULE | Freq: Two times a day (BID) | ORAL | Status: DC
  Administered 2023-12-14 – 2023-12-15 (×2): 600 mg via ORAL
  Filled 2023-12-14 (×2): qty 2

## 2023-12-14 MED ORDER — POTASSIUM CHLORIDE CRYS ER 20 MEQ PO TBCR
40.0000 meq | EXTENDED_RELEASE_TABLET | Freq: Once | ORAL | Status: AC
  Administered 2023-12-14: 40 meq via ORAL
  Filled 2023-12-14: qty 2

## 2023-12-14 SURGICAL SUPPLY — 8 items
CATH 5FR JL3.5 JR4 ANG PIG MP (CATHETERS) IMPLANT
CATH SWAN GANZ 7F STRAIGHT (CATHETERS) IMPLANT
DEVICE RAD COMP TR BAND LRG (VASCULAR PRODUCTS) IMPLANT
GLIDESHEATH SLEND SS 6F .021 (SHEATH) IMPLANT
GLIDESHEATH SLENDER 7FR .021G (SHEATH) IMPLANT
GUIDEWIRE INQWIRE 1.5J.035X260 (WIRE) IMPLANT
PACK CARDIAC CATHETERIZATION (CUSTOM PROCEDURE TRAY) ×2 IMPLANT
SET ATX-X65L (MISCELLANEOUS) IMPLANT

## 2023-12-14 NOTE — Progress Notes (Addendum)
 PROGRESS NOTE  Tabitha Wilcox FMW:[id number] DOB: 10-28-1955 DOA: 12/12/2023 PCP: Shelda Atlas, MD   LOS: 1 day   Brief narrative:  Tabitha Wilcox is a 68 y.o. female with past medical history of seasonal allergies, anxiety, depression, asthma, bilateral carpal tunnel syndrome, GERD, right rotator cuff injury, sciatica,  PUD presented to hospital with progressive shortness of breath and dyspnea that woke her up from sleep.  She was recently seen at an urgent care center due to pulmonary edema and was given a furosemide prescription.  As per EMS, patient was satting in the low 90s at home.  In the ED patient was tachypneic tachycardic and slightly hypertensive.  Labs with essentially within normal limits.  proBNP elevated at 350. 2 view chest radiograph showing cardiomegaly with worsening vascular congestion and moderate diffuse interstitial edema.  Increased perihilar opacity in both lungs which are most likely alveolar edema.  Bilateral small pleural effusions.  Patient was given 20 mg of IV Lasix 40 mg, KCl 40 mEq p.o. x 1 dose and was admitted to hospital for further evaluation and treatment..  Assessment/Plan: Principal Problem:   New onset of congestive heart failure (HCC) Active Problems:   Asthma, chronic   GERD (gastroesophageal reflux disease)   Tobacco abuse   Alcohol dependence in remission (HCC)   Pre-diabetes   Class 1 obesity   Normochromic anemia   Hyperlipidemia   Glaucoma  New onset of acute systolic congestive heart failure   Patient had dyspnea on exertion orthopnea and decrease exercise tolerance.  Continue intake and output charting Daily weights fluid and salt restriction.   Cardiology on board.  2D echocardiogram shows LVEF  of 40 to 45% with global hypokinesis and grade 1 diastolic dysfunction..  Cardiology has recommended cardiac catheterization for further evaluation.  Follow recommendations.    Asthma, chronic/emphysema Continue supplemental oxygen  Solu-Medrol   followed by prednisone  and bronchodilators.  Able to lie flat and breathe better today.     GERD (gastroesophageal reflux disease) Continue Protonix     History of tobacco abuse Not smoked in last 3 years.     Alcohol dependence in remission  Drinks alcohol occasionally.  Will continue to monitor.     Pre-diabetes Monitor blood glucose levels while on steroids.  Hemoglobin A1c of 5.8.      Class 1 obesity Body mass index is 30.61 kg/m.  Emphasized on lifestyle modification.    Normochromic anemia Continue to monitor.  No obvious bleeding     Hyperlipidemia Continue Lipitor     Glaucoma Continue timolol  and Xalatan  drops.  Follow-up with pulmonary as outpatient.   DVT prophylaxis: SCDs Start: 12/12/23 0758   Disposition: Home likely in 1 to 2 days  Status is: Inpatient The patient is inpatient because: IV diuretics, new heart failure, pending clinical improvement, plan for cardiac catheterization    Code Status:     Code Status: Full Code  Family Communication: None at bedside  Consultants: Cardiology  Procedures: Plan for cardiac catheterization  Anti-infectives:  None  Anti-infectives (From admission, onward)    None       Subjective: Today, patient was seen and examined at bedside.  Patient feels better with breathing and was able to lie flat.  Denies any chest pain.  Objective: Vitals:   12/14/23 0432 12/14/23 1325  BP: 128/72 134/77  Pulse: 77 90  Resp: 18 20  Temp: 97.8 F (36.6 C) 98.7 F (37.1 C)  SpO2: 99% 98%    Intake/Output Summary (Last 24 hours)  at 12/14/2023 1442 Last data filed at 12/14/2023 0424 Gross per 24 hour  Intake 730 ml  Output 1000 ml  Net -270 ml   Filed Weights   12/13/23 0615 12/14/23 0436  Weight: 73.5 kg 72.6 kg   Body mass index is 30.24 kg/m.   Physical Exam:  GENERAL: Patient is alert awake and oriented. Not in obvious distress.  On room air, obese built HENT: No scleral pallor or icterus. Pupils  equally reactive to light. Oral mucosa is moist NECK: is supple, no gross swelling noted. CHEST: Decreased breath sounds bilateral. CVS: S1 and S2 heard, no murmur. Regular rate and rhythm.  ABDOMEN: Soft, non-tender, bowel sounds are present. EXTREMITIES: No edema. CNS: Cranial nerves are intact. No focal motor deficits. SKIN: warm and dry without rashes.  Data Review: I have personally reviewed the following laboratory data and studies,  CBC: Recent Labs  Lab 12/12/23 0619 12/13/23 0456 12/14/23 0520  WBC 8.9 12.3* 10.8*  HGB 10.2* 10.9* 11.1*  HCT 32.3* 35.0* 34.4*  MCV 91.8 89.5 89.8  PLT 370 452* 447*   Basic Metabolic Panel: Recent Labs  Lab 12/12/23 0619 12/13/23 0456 12/14/23 0520  NA 142 136 137  K 3.7 4.5 3.6  CL 104 100 100  CO2 26 23 24   GLUCOSE 137* 128* 113*  BUN 12 21 26*  CREATININE 0.70 0.84 0.80  CALCIUM  9.3 10.2 10.1  MG  --   --  2.6*   Liver Function Tests: Recent Labs  Lab 12/13/23 0456  AST 28  ALT 21  ALKPHOS 113  BILITOT 0.7  PROT 7.9  ALBUMIN 4.7   No results for input(s): LIPASE, AMYLASE in the last 168 hours. No results for input(s): AMMONIA in the last 168 hours. Cardiac Enzymes: No results for input(s): CKTOTAL, CKMB, CKMBINDEX, TROPONINI in the last 168 hours. BNP (last 3 results) No results for input(s): BNP in the last 8760 hours.  ProBNP (last 3 results) Recent Labs    12/12/23 0619  PROBNP 350.0*    CBG: No results for input(s): GLUCAP in the last 168 hours. No results found for this or any previous visit (from the past 240 hours).   Studies: ECHOCARDIOGRAM COMPLETE Result Date: 12/13/2023    ECHOCARDIOGRAM REPORT   Patient Name:   Tabitha Wilcox Date of Exam: 12/13/2023 Medical Rec #:  [id number]      Height:       61.0 in Accession #:    [id number]     Weight:       162.0 lb Date of Birth:  Jul 20, 1955       BSA:          1.727 m Patient Age:    68 years       BP:           165/97 mmHg Patient  Gender: F              HR:           86 bpm. Exam Location:  Inpatient Procedure: 2D Echo and Intracardiac Opacification Agent (Both Spectral and Color            Flow Doppler were utilized during procedure). Indications:    CHF  History:        Patient has no prior history of Echocardiogram examinations.                 CHF.  Sonographer:    Norleen Peter Referring Phys: [phone] DAVID DORN  ORTIZ IMPRESSIONS  1. Left ventricular ejection fraction, by estimation, is 40 to 45%. The left ventricle has mildly decreased function. The left ventricle demonstrates global hypokinesis. Left ventricular diastolic parameters are consistent with Grade I diastolic dysfunction (impaired relaxation).  2. Right ventricular systolic function is normal. The right ventricular size is normal.  3. Left atrial size was mildly dilated.  4. The mitral valve is normal in structure. Trivial mitral valve regurgitation. No evidence of mitral stenosis.  5. The aortic valve is normal in structure. Aortic valve regurgitation is not visualized. No aortic stenosis is present.  6. The inferior vena cava is normal in size with greater than 50% respiratory variability, suggesting right atrial pressure of 3 mmHg. FINDINGS  Left Ventricle: Left ventricular ejection fraction, by estimation, is 40 to 45%. The left ventricle has mildly decreased function. The left ventricle demonstrates global hypokinesis. Definity contrast agent was given IV to delineate the left ventricular  endocardial borders. The left ventricular internal cavity size was normal in size. There is no left ventricular hypertrophy. Abnormal (paradoxical) septal motion, consistent with left bundle branch block. Left ventricular diastolic parameters are consistent with Grade I diastolic dysfunction (impaired relaxation). Right Ventricle: The right ventricular size is normal. No increase in right ventricular wall thickness. Right ventricular systolic function is normal. Left Atrium: Left  atrial size was mildly dilated. Right Atrium: Right atrial size was normal in size. Pericardium: There is no evidence of pericardial effusion. Mitral Valve: The mitral valve is normal in structure. Trivial mitral valve regurgitation. No evidence of mitral valve stenosis. Tricuspid Valve: The tricuspid valve is normal in structure. Tricuspid valve regurgitation is trivial. No evidence of tricuspid stenosis. Aortic Valve: The aortic valve is normal in structure. Aortic valve regurgitation is not visualized. No aortic stenosis is present. Aortic valve mean gradient measures 6.0 mmHg. Aortic valve peak gradient measures 10.4 mmHg. Aortic valve area, by VTI measures 1.30 cm. Pulmonic Valve: The pulmonic valve was normal in structure. Pulmonic valve regurgitation is mild. No evidence of pulmonic stenosis. Aorta: The aortic root is normal in size and structure. Venous: The inferior vena cava is normal in size with greater than 50% respiratory variability, suggesting right atrial pressure of 3 mmHg. IAS/Shunts: No atrial level shunt detected by color flow Doppler.  LEFT VENTRICLE PLAX 2D LVIDd:         5.20 cm      Diastology LVIDs:         4.20 cm      LV e' medial:    4.03 cm/s LV PW:         1.10 cm      LV E/e' medial:  26.6 LV IVS:        0.80 cm      LV e' lateral:   6.42 cm/s LVOT diam:     1.80 cm      LV E/e' lateral: 16.7 LV SV:         42 LV SV Index:   25 LVOT Area:     2.54 cm  LV Volumes (MOD) LV vol d, MOD A2C: 117.0 ml LV vol d, MOD A4C: 126.0 ml LV vol s, MOD A2C: 63.5 ml LV vol s, MOD A4C: 61.7 ml LV SV MOD A2C:     53.5 ml LV SV MOD A4C:     126.0 ml LV SV MOD BP:      62.3 ml RIGHT VENTRICLE  IVC RV Basal diam:  3.40 cm     IVC diam: 1.90 cm RV S prime:     22.50 cm/s TAPSE (M-mode): 2.2 cm LEFT ATRIUM             Index        RIGHT ATRIUM           Index LA diam:        3.60 cm 2.08 cm/m   RA Area:     15.70 cm LA Vol (A2C):   46.3 ml 26.81 ml/m  RA Volume:   44.40 ml  25.71 ml/m LA Vol  (A4C):   60.4 ml 34.97 ml/m LA Biplane Vol: 56.6 ml 32.77 ml/m  AORTIC VALVE                     PULMONIC VALVE AV Area (Vmax):    1.41 cm      PV Vmax:       0.96 m/s AV Area (Vmean):   1.27 cm      PV Peak grad:  3.7 mmHg AV Area (VTI):     1.30 cm AV Vmax:           161.00 cm/s AV Vmean:          120.000 cm/s AV VTI:            0.326 m AV Peak Grad:      10.4 mmHg AV Mean Grad:      6.0 mmHg LVOT Vmax:         89.40 cm/s LVOT Vmean:        59.800 cm/s LVOT VTI:          0.167 m LVOT/AV VTI ratio: 0.51  AORTA Ao Root diam: 2.80 cm Ao Asc diam:  2.60 cm MITRAL VALVE MV Area (PHT): 5.42 cm     SHUNTS MV Decel Time: 140 msec     Systemic VTI:  0.17 m MV E velocity: 107.00 cm/s  Systemic Diam: 1.80 cm MV A velocity: 113.00 cm/s MV E/A ratio:  0.95 Oneil Parchment MD Electronically signed by Oneil Parchment MD Signature Date/Time: 12/13/2023/1:42:08 PM    Final       Vernal Alstrom, MD  [facility] 12/14/2023  If 7PM-7AM, please contact night-coverage

## 2023-12-14 NOTE — H&P (View-Only) (Signed)
 Progress Note  Patient Name: Tabitha Wilcox Date of Encounter: 12/14/2023  Primary Cardiologist: Jerel Balding, MD  Subjective   No chest pain. SOB much improved. Able to lay flat.  Inpatient Medications    Scheduled Meds:  atorvastatin  20 mg Oral QHS   potassium chloride  40 mEq Oral Once   predniSONE   40 mg Oral Q breakfast   Continuous Infusions:  PRN Meds: acetaminophen  **OR** acetaminophen , camphor-menthol , guaiFENesin, ipratropium-albuterol , ondansetron  **OR** ondansetron  (ZOFRAN ) IV   Vital Signs    Vitals:   12/13/23 2017 12/13/23 2047 12/14/23 0432 12/14/23 0436  BP: (!) 144/78  128/72   Pulse: 93  77   Resp: 17  18   Temp: 98.2 F (36.8 C)  97.8 F (36.6 C)   TempSrc: Oral  Oral   SpO2: 95% 96% 99%   Weight:    72.6 kg  Height:        Intake/Output Summary (Last 24 hours) at 12/14/2023 1021 Last data filed at 12/14/2023 0424 Gross per 24 hour  Intake 970 ml  Output 1100 ml  Net -130 ml      12/14/2023    4:36 AM 12/13/2023    6:15 AM 12/08/2023    3:51 PM  Last 3 Weights  Weight (lbs) 160 lb 0.9 oz 162 lb 182 lb 1.6 oz  Weight (kg) 72.6 kg 73.483 kg 82.6 kg     Telemetry    NSR, occasional bursts of sinus tach (gradual onset/offset), potentially with movement based on review after my interaction with her - Personally Reviewed  Physical Exam   GEN: No acute distress.  HEENT: Normocephalic, atraumatic, sclera non-icteric. Neck: No JVD or bruits. Cardiac: RRR no murmurs, rubs, or gallops.  Respiratory: Clear to auscultation bilaterally. Breathing is unlabored. GI: Soft, nontender, non-distended, BS +x 4. MS: no deformity. Extremities: No clubbing or cyanosis. No edema. Distal pedal pulses are 2+ and equal bilaterally. Neuro:  AAOx3. Follows commands. Psych:  Responds to questions appropriately with a normal affect.  Labs    High Sensitivity Troponin:  No results for input(s): TROPONINIHS in the last 720 hours.    Cardiac EnzymesNo  results for input(s): TROPONINI in the last 168 hours. No results for input(s): TROPIPOC in the last 168 hours.   Chemistry Recent Labs  Lab 12/12/23 0619 12/13/23 0456 12/14/23 0520  NA 142 136 137  K 3.7 4.5 3.6  CL 104 100 100  CO2 26 23 24   GLUCOSE 137* 128* 113*  BUN 12 21 26*  CREATININE 0.70 0.84 0.80  CALCIUM  9.3 10.2 10.1  PROT  --  7.9  --   ALBUMIN  --  4.7  --   AST  --  28  --   ALT  --  21  --   ALKPHOS  --  113  --   BILITOT  --  0.7  --   GFRNONAA >60 >60 >60  ANIONGAP 13 13 13      Hematology Recent Labs  Lab 12/12/23 0619 12/13/23 0456 12/13/23 1041 12/14/23 0520  WBC 8.9 12.3*  --  10.8*  RBC 3.52* 3.91 3.83* 3.83*  HGB 10.2* 10.9*  --  11.1*  HCT 32.3* 35.0*  --  34.4*  MCV 91.8 89.5  --  89.8  MCH 29.0 27.9  --  29.0  MCHC 31.6 31.1  --  32.3  RDW 13.9 14.0  --  14.1  PLT 370 452*  --  447*    BNP Recent Labs  Lab 12/12/23 0619  PROBNP 350.0*     DDimer No results for input(s): DDIMER in the last 168 hours.   Radiology    ECHOCARDIOGRAM COMPLETE Result Date: 12/13/2023    ECHOCARDIOGRAM REPORT   Patient Name:   PAITEN BOIES Date of Exam: 12/13/2023 Medical Rec #:  [id number]      Height:       61.0 in Accession #:    [id number]     Weight:       162.0 lb Date of Birth:  Aug 11, 1955       BSA:          1.727 m Patient Age:    68 years       BP:           165/97 mmHg Patient Gender: F              HR:           86 bpm. Exam Location:  Inpatient Procedure: 2D Echo and Intracardiac Opacification Agent (Both Spectral and Color            Flow Doppler were utilized during procedure). Indications:    CHF  History:        Patient has no prior history of Echocardiogram examinations.                 CHF.  Sonographer:    Norleen Peter Referring Phys: [id number] DAVID MANUEL ORTIZ IMPRESSIONS  1. Left ventricular ejection fraction, by estimation, is 40 to 45%. The left ventricle has mildly decreased function. The left ventricle demonstrates global  hypokinesis. Left ventricular diastolic parameters are consistent with Grade I diastolic dysfunction (impaired relaxation).  2. Right ventricular systolic function is normal. The right ventricular size is normal.  3. Left atrial size was mildly dilated.  4. The mitral valve is normal in structure. Trivial mitral valve regurgitation. No evidence of mitral stenosis.  5. The aortic valve is normal in structure. Aortic valve regurgitation is not visualized. No aortic stenosis is present.  6. The inferior vena cava is normal in size with greater than 50% respiratory variability, suggesting right atrial pressure of 3 mmHg. FINDINGS  Left Ventricle: Left ventricular ejection fraction, by estimation, is 40 to 45%. The left ventricle has mildly decreased function. The left ventricle demonstrates global hypokinesis. Definity contrast agent was given IV to delineate the left ventricular  endocardial borders. The left ventricular internal cavity size was normal in size. There is no left ventricular hypertrophy. Abnormal (paradoxical) septal motion, consistent with left bundle branch block. Left ventricular diastolic parameters are consistent with Grade I diastolic dysfunction (impaired relaxation). Right Ventricle: The right ventricular size is normal. No increase in right ventricular wall thickness. Right ventricular systolic function is normal. Left Atrium: Left atrial size was mildly dilated. Right Atrium: Right atrial size was normal in size. Pericardium: There is no evidence of pericardial effusion. Mitral Valve: The mitral valve is normal in structure. Trivial mitral valve regurgitation. No evidence of mitral valve stenosis. Tricuspid Valve: The tricuspid valve is normal in structure. Tricuspid valve regurgitation is trivial. No evidence of tricuspid stenosis. Aortic Valve: The aortic valve is normal in structure. Aortic valve regurgitation is not visualized. No aortic stenosis is present. Aortic valve mean gradient  measures 6.0 mmHg. Aortic valve peak gradient measures 10.4 mmHg. Aortic valve area, by VTI measures 1.30 cm. Pulmonic Valve: The pulmonic valve was normal in structure. Pulmonic valve regurgitation is mild. No evidence of pulmonic stenosis. Aorta:  The aortic root is normal in size and structure. Venous: The inferior vena cava is normal in size with greater than 50% respiratory variability, suggesting right atrial pressure of 3 mmHg. IAS/Shunts: No atrial level shunt detected by color flow Doppler.  LEFT VENTRICLE PLAX 2D LVIDd:         5.20 cm      Diastology LVIDs:         4.20 cm      LV e' medial:    4.03 cm/s LV PW:         1.10 cm      LV E/e' medial:  26.6 LV IVS:        0.80 cm      LV e' lateral:   6.42 cm/s LVOT diam:     1.80 cm      LV E/e' lateral: 16.7 LV SV:         42 LV SV Index:   25 LVOT Area:     2.54 cm  LV Volumes (MOD) LV vol d, MOD A2C: 117.0 ml LV vol d, MOD A4C: 126.0 ml LV vol s, MOD A2C: 63.5 ml LV vol s, MOD A4C: 61.7 ml LV SV MOD A2C:     53.5 ml LV SV MOD A4C:     126.0 ml LV SV MOD BP:      62.3 ml RIGHT VENTRICLE             IVC RV Basal diam:  3.40 cm     IVC diam: 1.90 cm RV S prime:     22.50 cm/s TAPSE (M-mode): 2.2 cm LEFT ATRIUM             Index        RIGHT ATRIUM           Index LA diam:        3.60 cm 2.08 cm/m   RA Area:     15.70 cm LA Vol (A2C):   46.3 ml 26.81 ml/m  RA Volume:   44.40 ml  25.71 ml/m LA Vol (A4C):   60.4 ml 34.97 ml/m LA Biplane Vol: 56.6 ml 32.77 ml/m  AORTIC VALVE                     PULMONIC VALVE AV Area (Vmax):    1.41 cm      PV Vmax:       0.96 m/s AV Area (Vmean):   1.27 cm      PV Peak grad:  3.7 mmHg AV Area (VTI):     1.30 cm AV Vmax:           161.00 cm/s AV Vmean:          120.000 cm/s AV VTI:            0.326 m AV Peak Grad:      10.4 mmHg AV Mean Grad:      6.0 mmHg LVOT Vmax:         89.40 cm/s LVOT Vmean:        59.800 cm/s LVOT VTI:          0.167 m LVOT/AV VTI ratio: 0.51  AORTA Ao Root diam: 2.80 cm Ao Asc diam:  2.60 cm  MITRAL VALVE MV Area (PHT): 5.42 cm     SHUNTS MV Decel Time: 140 msec     Systemic VTI:  0.17 m MV E velocity: 107.00 cm/s  Systemic Diam: 1.80 cm MV  A velocity: 113.00 cm/s MV E/A ratio:  0.95 Oneil Parchment MD Electronically signed by Oneil Parchment MD Signature Date/Time: 12/13/2023/1:42:08 PM    Final     Cardiac Studies   2d echo 12/13/23  1. Left ventricular ejection fraction, by estimation, is 40 to 45%. The  left ventricle has mildly decreased function. The left ventricle  demonstrates global hypokinesis. Left ventricular diastolic parameters are  consistent with Grade I diastolic  dysfunction (impaired relaxation).   2. Right ventricular systolic function is normal. The right ventricular  size is normal.   3. Left atrial size was mildly dilated.   4. The mitral valve is normal in structure. Trivial mitral valve  regurgitation. No evidence of mitral stenosis.   5. The aortic valve is normal in structure. Aortic valve regurgitation is  not visualized. No aortic stenosis is present.   6. The inferior vena cava is normal in size with greater than 50%  respiratory variability, suggesting right atrial pressure of 3 mmHg.   Patient Profile     68 y.o. adult with borderline DM, HLD, elevated blood pressure without dx of HTN, anxiety, depression, remote mild bilateral carpal tunnel, asthma, GERD, sciatica, tobacco abuse, remote PUD, former tobacco use, recent cessation of vaping, remote cocaine use (none recently). Admitted with recent onset pulmonary edema and CHF. Retrospectively she has been experiencing DOE x several months.  Assessment & Plan    1. 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 All questions answered. Consent obtained in 10/6 note - give KCl 40meq x 1 for K 3.6 - received new 2.5mg  lisinopril on 10/5 subsequently discontinued to be able to utilize ARB/ARNI if needed -> GDMT TBD based on cath results - troponins negative   2. Essential HTN (new formal dx) - anticipate managing in context above - improving with diuresis   3. LBBB - new finding, no bradycardia seen, no chest pain   4. Normocytic anemia - present in 06/2022 as well, but variable in the past - stable, per primary team   5. HLD, aortic atherosclerosis - previous LDL 125 in 06/2022, here LDL 76, Lp(a) pending - atorvastatin 20mg  daily added by primary team, can consider dose titration if CAD discovered in workup   6. Pre DM - A1c 5.8   7. Asthma - per primary team  8. Suppressed TSH - Free T4 wnl, T3 pending  I sent msg to [facility] to please assist in HF [facility] navigation as I think she would benefit from their [facility] program  For questions or updates, please contact [facility] Please consult [email or web address] for contact info under Cardiology/STEMI.  Signed, Raphael LOISE Bring, PA-C 12/14/2023, 10:21 AM     I have seen and examined the patient along with Dayna N Dunn, PA-C.  I have reviewed the chart, notes and new data.  I agree with PA/NP's note.  Key new complaints: Feels much better.  No orthopnea.  Eager to go ahead with a heart catheterization as planned (scheduled for 3 PM today). Key examination changes: Body habitus limits detailed assessment  of volume status, but she does not have any edema or overt jugular venous pulsations or pulmonary rales.  Regular rate and rhythm. Key new findings / data: Normal creatinine and very slight bump in BUN level.  LDL cholesterol is actually pretty good at 76 and she has a great HDL 86.  Borderline  normocytic anemia with transferrin saturation 16% but normal ferritin 76.  Free T4 normal at 1.06 (suspect sick euthyroid syndrome).  PLAN: For right and left heart catheterization today, which we have discussed in detail with the patient and her sister at the bedside.  Most likely she has nonischemic cardiomyopathy.  Will start treatment with Entresto, SGLT2 inhibitor, aldosterone antagonist and carvedilol after cardiac catheterization.    It is possible (but less likely) that she has severe coronary disease that requires surgical revascularization, in which case she should remain at [facility] for surgical consultation.  We have discussed the complex current medical therapy for heart failure with multiple medications as listed above, the gradual titration of doses, importance of sodium restricted diet and daily weight monitoring in a lot of detail.  Jhon Mallozzi, MD, FACC [facility] [phone] 12/14/2023, 12:13 PM

## 2023-12-14 NOTE — Progress Notes (Addendum)
 Progress Note  Patient Name: Tabitha Wilcox Date of Encounter: 12/14/2023  Primary Cardiologist: Jerel Balding, MD  Subjective   No chest pain. SOB much improved. Able to lay flat.  Inpatient Medications    Scheduled Meds:  atorvastatin  20 mg Oral QHS   potassium chloride  40 mEq Oral Once   predniSONE   40 mg Oral Q breakfast   Continuous Infusions:  PRN Meds: acetaminophen  **OR** acetaminophen , camphor-menthol , guaiFENesin, ipratropium-albuterol , ondansetron  **OR** ondansetron  (ZOFRAN ) IV   Vital Signs    Vitals:   12/13/23 2017 12/13/23 2047 12/14/23 0432 12/14/23 0436  BP: (!) 144/78  128/72   Pulse: 93  77   Resp: 17  18   Temp: 98.2 F (36.8 C)  97.8 F (36.6 C)   TempSrc: Oral  Oral   SpO2: 95% 96% 99%   Weight:    72.6 kg  Height:        Intake/Output Summary (Last 24 hours) at 12/14/2023 1021 Last data filed at 12/14/2023 0424 Gross per 24 hour  Intake 970 ml  Output 1100 ml  Net -130 ml      12/14/2023    4:36 AM 12/13/2023    6:15 AM 12/08/2023    3:51 PM  Last 3 Weights  Weight (lbs) 160 lb 0.9 oz 162 lb 182 lb 1.6 oz  Weight (kg) 72.6 kg 73.483 kg 82.6 kg     Telemetry    NSR, occasional bursts of sinus tach (gradual onset/offset), potentially with movement based on review after my interaction with her - Personally Reviewed  Physical Exam   GEN: No acute distress.  HEENT: Normocephalic, atraumatic, sclera non-icteric. Neck: No JVD or bruits. Cardiac: RRR no murmurs, rubs, or gallops.  Respiratory: Clear to auscultation bilaterally. Breathing is unlabored. GI: Soft, nontender, non-distended, BS +x 4. MS: no deformity. Extremities: No clubbing or cyanosis. No edema. Distal pedal pulses are 2+ and equal bilaterally. Neuro:  AAOx3. Follows commands. Psych:  Responds to questions appropriately with a normal affect.  Labs    High Sensitivity Troponin:  No results for input(s): TROPONINIHS in the last 720 hours.    Cardiac EnzymesNo  results for input(s): TROPONINI in the last 168 hours. No results for input(s): TROPIPOC in the last 168 hours.   Chemistry Recent Labs  Lab 12/12/23 0619 12/13/23 0456 12/14/23 0520  NA 142 136 137  K 3.7 4.5 3.6  CL 104 100 100  CO2 26 23 24   GLUCOSE 137* 128* 113*  BUN 12 21 26*  CREATININE 0.70 0.84 0.80  CALCIUM  9.3 10.2 10.1  PROT  --  7.9  --   ALBUMIN  --  4.7  --   AST  --  28  --   ALT  --  21  --   ALKPHOS  --  113  --   BILITOT  --  0.7  --   GFRNONAA >60 >60 >60  ANIONGAP 13 13 13      Hematology Recent Labs  Lab 12/12/23 0619 12/13/23 0456 12/13/23 1041 12/14/23 0520  WBC 8.9 12.3*  --  10.8*  RBC 3.52* 3.91 3.83* 3.83*  HGB 10.2* 10.9*  --  11.1*  HCT 32.3* 35.0*  --  34.4*  MCV 91.8 89.5  --  89.8  MCH 29.0 27.9  --  29.0  MCHC 31.6 31.1  --  32.3  RDW 13.9 14.0  --  14.1  PLT 370 452*  --  447*    BNP Recent Labs  Lab 12/12/23 0619  PROBNP 350.0*     DDimer No results for input(s): DDIMER in the last 168 hours.   Radiology    ECHOCARDIOGRAM COMPLETE Result Date: 12/13/2023    ECHOCARDIOGRAM REPORT   Patient Name:   Tabitha Wilcox Date of Exam: 12/13/2023 Medical Rec #:  [id number]      Height:       61.0 in Accession #:    [id number]     Weight:       162.0 lb Date of Birth:  Aug 11, 1955       BSA:          1.727 m Patient Age:    68 years       BP:           165/97 mmHg Patient Gender: F              HR:           86 bpm. Exam Location:  Inpatient Procedure: 2D Echo and Intracardiac Opacification Agent (Both Spectral and Color            Flow Doppler were utilized during procedure). Indications:    CHF  History:        Patient has no prior history of Echocardiogram examinations.                 CHF.  Sonographer:    Norleen Peter Referring Phys: [id number] DAVID MANUEL ORTIZ IMPRESSIONS  1. Left ventricular ejection fraction, by estimation, is 40 to 45%. The left ventricle has mildly decreased function. The left ventricle demonstrates global  hypokinesis. Left ventricular diastolic parameters are consistent with Grade I diastolic dysfunction (impaired relaxation).  2. Right ventricular systolic function is normal. The right ventricular size is normal.  3. Left atrial size was mildly dilated.  4. The mitral valve is normal in structure. Trivial mitral valve regurgitation. No evidence of mitral stenosis.  5. The aortic valve is normal in structure. Aortic valve regurgitation is not visualized. No aortic stenosis is present.  6. The inferior vena cava is normal in size with greater than 50% respiratory variability, suggesting right atrial pressure of 3 mmHg. FINDINGS  Left Ventricle: Left ventricular ejection fraction, by estimation, is 40 to 45%. The left ventricle has mildly decreased function. The left ventricle demonstrates global hypokinesis. Definity contrast agent was given IV to delineate the left ventricular  endocardial borders. The left ventricular internal cavity size was normal in size. There is no left ventricular hypertrophy. Abnormal (paradoxical) septal motion, consistent with left bundle branch block. Left ventricular diastolic parameters are consistent with Grade I diastolic dysfunction (impaired relaxation). Right Ventricle: The right ventricular size is normal. No increase in right ventricular wall thickness. Right ventricular systolic function is normal. Left Atrium: Left atrial size was mildly dilated. Right Atrium: Right atrial size was normal in size. Pericardium: There is no evidence of pericardial effusion. Mitral Valve: The mitral valve is normal in structure. Trivial mitral valve regurgitation. No evidence of mitral valve stenosis. Tricuspid Valve: The tricuspid valve is normal in structure. Tricuspid valve regurgitation is trivial. No evidence of tricuspid stenosis. Aortic Valve: The aortic valve is normal in structure. Aortic valve regurgitation is not visualized. No aortic stenosis is present. Aortic valve mean gradient  measures 6.0 mmHg. Aortic valve peak gradient measures 10.4 mmHg. Aortic valve area, by VTI measures 1.30 cm. Pulmonic Valve: The pulmonic valve was normal in structure. Pulmonic valve regurgitation is mild. No evidence of pulmonic stenosis. Aorta:  The aortic root is normal in size and structure. Venous: The inferior vena cava is normal in size with greater than 50% respiratory variability, suggesting right atrial pressure of 3 mmHg. IAS/Shunts: No atrial level shunt detected by color flow Doppler.  LEFT VENTRICLE PLAX 2D LVIDd:         5.20 cm      Diastology LVIDs:         4.20 cm      LV e' medial:    4.03 cm/s LV PW:         1.10 cm      LV E/e' medial:  26.6 LV IVS:        0.80 cm      LV e' lateral:   6.42 cm/s LVOT diam:     1.80 cm      LV E/e' lateral: 16.7 LV SV:         42 LV SV Index:   25 LVOT Area:     2.54 cm  LV Volumes (MOD) LV vol d, MOD A2C: 117.0 ml LV vol d, MOD A4C: 126.0 ml LV vol s, MOD A2C: 63.5 ml LV vol s, MOD A4C: 61.7 ml LV SV MOD A2C:     53.5 ml LV SV MOD A4C:     126.0 ml LV SV MOD BP:      62.3 ml RIGHT VENTRICLE             IVC RV Basal diam:  3.40 cm     IVC diam: 1.90 cm RV S prime:     22.50 cm/s TAPSE (M-mode): 2.2 cm LEFT ATRIUM             Index        RIGHT ATRIUM           Index LA diam:        3.60 cm 2.08 cm/m   RA Area:     15.70 cm LA Vol (A2C):   46.3 ml 26.81 ml/m  RA Volume:   44.40 ml  25.71 ml/m LA Vol (A4C):   60.4 ml 34.97 ml/m LA Biplane Vol: 56.6 ml 32.77 ml/m  AORTIC VALVE                     PULMONIC VALVE AV Area (Vmax):    1.41 cm      PV Vmax:       0.96 m/s AV Area (Vmean):   1.27 cm      PV Peak grad:  3.7 mmHg AV Area (VTI):     1.30 cm AV Vmax:           161.00 cm/s AV Vmean:          120.000 cm/s AV VTI:            0.326 m AV Peak Grad:      10.4 mmHg AV Mean Grad:      6.0 mmHg LVOT Vmax:         89.40 cm/s LVOT Vmean:        59.800 cm/s LVOT VTI:          0.167 m LVOT/AV VTI ratio: 0.51  AORTA Ao Root diam: 2.80 cm Ao Asc diam:  2.60 cm  MITRAL VALVE MV Area (PHT): 5.42 cm     SHUNTS MV Decel Time: 140 msec     Systemic VTI:  0.17 m MV E velocity: 107.00 cm/s  Systemic Diam: 1.80 cm MV  A velocity: 113.00 cm/s MV E/A ratio:  0.95 Oneil Parchment MD Electronically signed by Oneil Parchment MD Signature Date/Time: 12/13/2023/1:42:08 PM    Final     Cardiac Studies   2d echo 12/13/23  1. Left ventricular ejection fraction, by estimation, is 40 to 45%. The  left ventricle has mildly decreased function. The left ventricle  demonstrates global hypokinesis. Left ventricular diastolic parameters are  consistent with Grade I diastolic  dysfunction (impaired relaxation).   2. Right ventricular systolic function is normal. The right ventricular  size is normal.   3. Left atrial size was mildly dilated.   4. The mitral valve is normal in structure. Trivial mitral valve  regurgitation. No evidence of mitral stenosis.   5. The aortic valve is normal in structure. Aortic valve regurgitation is  not visualized. No aortic stenosis is present.   6. The inferior vena cava is normal in size with greater than 50%  respiratory variability, suggesting right atrial pressure of 3 mmHg.   Patient Profile     68 y.o. adult with borderline DM, HLD, elevated blood pressure without dx of HTN, anxiety, depression, remote mild bilateral carpal tunnel, asthma, GERD, sciatica, tobacco abuse, remote PUD, former tobacco use, recent cessation of vaping, remote cocaine use (none recently). Admitted with recent onset pulmonary edema and CHF. Retrospectively she has been experiencing DOE x several months.  Assessment & Plan    1. 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 All questions answered. Consent obtained in 10/6 note - give KCl 40meq x 1 for K 3.6 - received new 2.5mg  lisinopril on 10/5 subsequently discontinued to be able to utilize ARB/ARNI if needed -> GDMT TBD based on cath results - troponins negative   2. Essential HTN (new formal dx) - anticipate managing in context above - improving with diuresis   3. LBBB - new finding, no bradycardia seen, no chest pain   4. Normocytic anemia - present in 06/2022 as well, but variable in the past - stable, per primary team   5. HLD, aortic atherosclerosis - previous LDL 125 in 06/2022, here LDL 76, Lp(a) pending - atorvastatin 20mg  daily added by primary team, can consider dose titration if CAD discovered in workup   6. Pre DM - A1c 5.8   7. Asthma - per primary team  8. Suppressed TSH - Free T4 wnl, T3 pending  I sent msg to [facility] to please assist in HF [facility] navigation as I think she would benefit from their [facility] program  For questions or updates, please contact [facility] Please consult [email or web address] for contact info under Cardiology/STEMI.  Signed, Raphael LOISE Bring, PA-C 12/14/2023, 10:21 AM     I have seen and examined the patient along with Dayna N Dunn, PA-C.  I have reviewed the chart, notes and new data.  I agree with PA/NP's note.  Key new complaints: Feels much better.  No orthopnea.  Eager to go ahead with a heart catheterization as planned (scheduled for 3 PM today). Key examination changes: Body habitus limits detailed assessment  of volume status, but she does not have any edema or overt jugular venous pulsations or pulmonary rales.  Regular rate and rhythm. Key new findings / data: Normal creatinine and very slight bump in BUN level.  LDL cholesterol is actually pretty good at 76 and she has a great HDL 86.  Borderline  normocytic anemia with transferrin saturation 16% but normal ferritin 76.  Free T4 normal at 1.06 (suspect sick euthyroid syndrome).  PLAN: For right and left heart catheterization today, which we have discussed in detail with the patient and her sister at the bedside.  Most likely she has nonischemic cardiomyopathy.  Will start treatment with Entresto, SGLT2 inhibitor, aldosterone antagonist and carvedilol after cardiac catheterization.    It is possible (but less likely) that she has severe coronary disease that requires surgical revascularization, in which case she should remain at [facility] for surgical consultation.  We have discussed the complex current medical therapy for heart failure with multiple medications as listed above, the gradual titration of doses, importance of sodium restricted diet and daily weight monitoring in a lot of detail.  Jhon Mallozzi, MD, FACC [facility] [phone] 12/14/2023, 12:13 PM

## 2023-12-14 NOTE — Interval H&P Note (Signed)
 History and Physical Interval Note:  12/14/2023 5:35 PM  Tabitha Wilcox  has presented today for surgery, with the diagnosis of heart failure - ejection fracture.  The various methods of treatment have been discussed with the patient and family. After consideration of risks, benefits and other options for treatment, the patient has consented to  Procedure(s): RIGHT/LEFT HEART CATH AND CORONARY ANGIOGRAPHY (N/A) as a surgical intervention.  The patient's history has been reviewed, patient examined, no change in status, stable for surgery.  I have reviewed the patient's chart and labs.  Questions were answered to the patient's satisfaction.     Maude [facility] 12/14/2023 5:35 PM

## 2023-12-14 NOTE — Progress Notes (Signed)
 TR BAND REMOVAL  LOCATION: R. radial  DEFLATED PER PROTOCOL:   Yes  TIME BAND OFF / DRESSING APPLIED:    2053  SITE UPON ARRIVAL:    Level 0  SITE AFTER BAND REMOVAL:    Level 0  CIRCULATION SENSATION AND MOVEMENT:    Within Normal Limits warm, dry, cap refill less than 3 seconds:  COMMENTS:

## 2023-12-15 ENCOUNTER — Encounter (HOSPITAL_COMMUNITY): Payer: Self-pay | Admitting: Cardiology

## 2023-12-15 ENCOUNTER — Other Ambulatory Visit (HOSPITAL_COMMUNITY): Payer: Self-pay

## 2023-12-15 ENCOUNTER — Telehealth (HOSPITAL_COMMUNITY): Payer: Self-pay | Admitting: Pharmacy Technician

## 2023-12-15 DIAGNOSIS — I509 Heart failure, unspecified: Secondary | ICD-10-CM | POA: Diagnosis not present

## 2023-12-15 LAB — POCT I-STAT 7, (LYTES, BLD GAS, ICA,H+H)
Acid-base deficit: 2 mmol/L (ref 0.0–2.0)
Bicarbonate: 21 mmol/L (ref 20.0–28.0)
Calcium, Ion: 1.19 mmol/L (ref 1.15–1.40)
HCT: 36 % (ref 36.0–46.0)
Hemoglobin: 12.2 g/dL (ref 12.0–15.0)
O2 Saturation: 93 %
Potassium: 4.7 mmol/L (ref 3.5–5.1)
Sodium: 137 mmol/L (ref 135–145)
TCO2: 22 mmol/L (ref 22–32)
pCO2 arterial: 30.3 mmHg — ABNORMAL LOW (ref 32–48)
pH, Arterial: 7.449 (ref 7.35–7.45)
pO2, Arterial: 64 mmHg — ABNORMAL LOW (ref 83–108)

## 2023-12-15 LAB — POCT I-STAT EG7
Acid-base deficit: 1 mmol/L (ref 0.0–2.0)
Bicarbonate: 23.3 mmol/L (ref 20.0–28.0)
Calcium, Ion: 1.22 mmol/L (ref 1.15–1.40)
HCT: 37 % (ref 36.0–46.0)
Hemoglobin: 12.6 g/dL (ref 12.0–15.0)
O2 Saturation: 64 %
Potassium: 4.8 mmol/L (ref 3.5–5.1)
Sodium: 136 mmol/L (ref 135–145)
TCO2: 24 mmol/L (ref 22–32)
pCO2, Ven: 36.7 mmHg — ABNORMAL LOW (ref 44–60)
pH, Ven: 7.411 (ref 7.25–7.43)
pO2, Ven: 33 mmHg (ref 32–45)

## 2023-12-15 LAB — T3: T3, Total: 99 ng/dL (ref 71–180)

## 2023-12-15 LAB — LIPOPROTEIN A (LPA): Lipoprotein (a): <8.4 nmol/L (ref <75.0)

## 2023-12-15 MED ORDER — FUROSEMIDE 20 MG PO TABS: 20.0000 mg | ORAL_TABLET | Freq: Every day | ORAL | Status: DC | PRN

## 2023-12-15 MED ORDER — ATORVASTATIN CALCIUM 20 MG PO TABS: 20.0000 mg | ORAL_TABLET | Freq: Every day | ORAL | 0 refills | Status: AC

## 2023-12-15 MED ORDER — DAPAGLIFLOZIN PROPANEDIOL 10 MG PO TABS: 10.0000 mg | ORAL_TABLET | Freq: Every day | ORAL | 0 refills | Status: DC

## 2023-12-15 MED ORDER — SACUBITRIL-VALSARTAN 24-26 MG PO TABS: 1.0000 | ORAL_TABLET | Freq: Two times a day (BID) | ORAL | 0 refills | Status: AC

## 2023-12-15 MED ORDER — PREDNISONE 10 MG PO TABS: 40.0000 mg | ORAL_TABLET | Freq: Every day | ORAL | 0 refills | Status: AC

## 2023-12-15 MED ORDER — FUROSEMIDE 20 MG PO TABS: 20.0000 mg | ORAL_TABLET | Freq: Every day | ORAL | 0 refills | Status: AC | PRN

## 2023-12-15 MED ORDER — CARVEDILOL 3.125 MG PO TABS: 3.1250 mg | ORAL_TABLET | Freq: Two times a day (BID) | ORAL | 0 refills | Status: DC

## 2023-12-15 NOTE — Progress Notes (Addendum)
 Progress Note  Patient Name: Tabitha Wilcox Date of Encounter: 12/15/2023 [facility] Cardiologist: Jerel Balding, MD  Interval Summary   Patient reports feeling well this AM. No chest pain. Breathing has improved. Right radial cath site is soft, nontender. Hopeful to go home today. Tolerating medications   Vital Signs Vitals:   12/15/23 0123 12/15/23 0500 12/15/23 0506 12/15/23 0900  BP: 90/62  114/75 118/77  Pulse: 80  84 77  Resp: 18  17 18   Temp: (!) 97.5 F (36.4 C)  (!) 97.5 F (36.4 C) 98.2 F (36.8 C)  TempSrc: Oral  Oral Oral  SpO2: 96%  97% 97%  Weight:  71.8 kg    Height:        Intake/Output Summary (Last 24 hours) at 12/15/2023 1118 Last data filed at 12/15/2023 0542 Gross per 24 hour  Intake 620 ml  Output --  Net 620 ml      12/15/2023    5:00 AM 12/14/2023    4:36 AM 12/13/2023    6:15 AM  Last 3 Weights  Weight (lbs) 158 lb 4.6 oz 160 lb 0.9 oz 162 lb  Weight (kg) 71.8 kg 72.6 kg 73.483 kg      Telemetry/ECG  NSR  - Personally Reviewed  Physical Exam  GEN: No acute distress. Sitting comfortably in the bed with head elevated    Neck: No JVD Cardiac:  RRR, no murmurs, rubs, or gallops. Right radial cath site soft, nontender  Respiratory: Clear to auscultation bilaterally. Normal WOB on room air  GI: Soft, nontender, non-distended  MS: No edema in BLE   Assessment & Plan   Acute HFmrEF  HTN  LBBB  - Patient presented with SOB, orthopnea, decreased exercise endurance. Treated with IV lasix with good response  - Echocardiogram 12/13/23 showed EF 40-45%, grade I DD, normal RV systolic function, no significant valvular abnormalities  - R/L heart cath yesterday showed normal coronary arteries, normal LV filling pressures, normal R heart filling pressures, normal cardiac output  - Patient reports feeling well today. Normal WOB on room air, euvolemic on exam  - Continue carvedilol 3.125 mg BID, farxiga 10 mg daily, entresto 24-26 mg BID -  BP tolerating GDMT. BMP pending this AM  - Start lasix 20 mg PRN for weight gain, ankle edema. Educated patient on proper use   HLD  Aortic Atherosclerosis  - Continue lipitor 20 mg daily   Otherwise per primary  - Normocytic anemia  - Pre DM  - Asthma  - Suppressed TSH   For questions or updates, please contact [facility] Please consult [email or web address] for contact info under    Signed, Rollo FABIENE Louder, PA-C   I have seen and examined the patient along with Rollo FABIENE Louder, PA-C , PA NP.  I have reviewed the chart, notes and new data.  I agree with PA/NP's note.  Key new complaints: Feeling much better. Key examination changes: Appears clinically euvolemic.  No problems at cath exit site. Key new findings / data: Reviewed hemodynamics from yesterday's cardiac catheterization (left heart filling pressures at the upper limit of normal normal pulmonary pressure, normal cardiac output).  No evidence of coronary artery disease.  PLAN:  [facility] will sign off.   Medication Recommendations:   Entresto 24/26 mg twice daily Carvedilol 3.125 mg twice daily Farxiga 10 mg daily Atorvastatin 20 mg daily Furosemide 40 mg tablets - as needed for 3 pound weight gain in 24 hours or 5 pound  weight gain in 1 week or if home weight exceeds 165 pounds Other recommendations (labs, testing, etc): Sodium restricted diet, daily weight monitoring, adjust diuretic dose as described above Follow up as an outpatient: 12/30/2023 with Rollo Louder, NP   Jerel Balding, MD, [facility] [phone] 12/15/2023, 12:48 PM

## 2023-12-15 NOTE — Progress Notes (Signed)
 Heart Failure Navigator Progress Note  Assessed for Heart & Vascular TOC clinic readiness.  Patient does not meet criteria due to has a scheduled [facility] appointment on 12/30/2023 per Cardiology Rollo Louder, PA. No HF TOC. .   Navigator will sign off at this time.   Stephane Haddock, BSN, Scientist, clinical (histocompatibility and immunogenetics) Only

## 2023-12-15 NOTE — Telephone Encounter (Signed)
 Patient Product/process development scientist completed.    The patient is insured through [facility]. Patient has Medicare and is not eligible for a copay card, but may be able to apply for patient assistance or Medicare RX Payment Plan (Patient Must reach out to their plan, if eligible for payment plan), if available.    Ran test claim for Entresto 24-26 mg and the current 30 day co-pay is $0.00.  Ran test claim for Farxiga 10 mg and the current 30 day co-pay is $0.00.  This test claim was processed through [facility]- copay amounts may vary at other pharmacies due to pharmacy/plan contracts, or as the patient moves through the different stages of their insurance plan.     Reyes Sharps, CPHT Pharmacy Technician Patient Advocate Specialist Lead [facility] Patient Advocate Team Direct Number: [phone]  Fax: [phone]

## 2023-12-15 NOTE — Plan of Care (Signed)
  Problem: Education: Goal: Individualized Educational Video(s) 12/15/2023 1354 by Alaina Dozier PARAS, RN Outcome: Adequate for Discharge 12/15/2023 0750 by Alaina Dozier PARAS, RN Outcome: Progressing   Problem: Activity: Goal: Capacity to carry out activities will improve 12/15/2023 1354 by Alaina Dozier PARAS, RN Outcome: Adequate for Discharge 12/15/2023 0750 by Alaina Dozier PARAS, RN Outcome: Progressing   Problem: Cardiac: Goal: Ability to achieve and maintain adequate cardiopulmonary perfusion will improve 12/15/2023 1354 by Alaina Dozier PARAS, RN Outcome: Adequate for Discharge 12/15/2023 0750 by Alaina Dozier PARAS, RN Outcome: Progressing   Problem: Education: Goal: Knowledge of disease or condition will improve 12/15/2023 1354 by Alaina Dozier PARAS, RN Outcome: Adequate for Discharge 12/15/2023 0750 by Alaina Dozier PARAS, RN Outcome: Progressing Goal: Knowledge of the prescribed therapeutic regimen will improve Outcome: Adequate for Discharge Goal: Individualized Educational Video(s) Outcome: Adequate for Discharge   Problem: Activity: Goal: Ability to tolerate increased activity will improve Outcome: Adequate for Discharge Goal: Will verbalize the importance of balancing activity with adequate rest periods Outcome: Adequate for Discharge   Problem: Respiratory: Goal: Ability to maintain a clear airway will improve Outcome: Adequate for Discharge Goal: Levels of oxygenation will improve Outcome: Adequate for Discharge Goal: Ability to maintain adequate ventilation will improve Outcome: Adequate for Discharge   Problem: Education: Goal: Knowledge of General Education information will improve Description: Including pain rating scale, medication(s)/side effects and non-pharmacologic comfort measures Outcome: Adequate for Discharge   Problem: Health Behavior/Discharge Planning: Goal: Ability to manage health-related needs will improve Outcome:  Adequate for Discharge   Problem: Clinical Measurements: Goal: Ability to maintain clinical measurements within normal limits will improve Outcome: Adequate for Discharge Goal: Will remain free from infection Outcome: Adequate for Discharge Goal: Diagnostic test results will improve Outcome: Adequate for Discharge Goal: Respiratory complications will improve Outcome: Adequate for Discharge Goal: Cardiovascular complication will be avoided Outcome: Adequate for Discharge   Problem: Activity: Goal: Risk for activity intolerance will decrease Outcome: Adequate for Discharge   Problem: Nutrition: Goal: Adequate nutrition will be maintained Outcome: Adequate for Discharge   Problem: Coping: Goal: Level of anxiety will decrease Outcome: Adequate for Discharge   Problem: Pain Managment: Goal: General experience of comfort will improve and/or be controlled Outcome: Adequate for Discharge   Problem: Education: Goal: Understanding of CV disease, CV risk reduction, and recovery process will improve Outcome: Adequate for Discharge Goal: Individualized Educational Video(s) Outcome: Adequate for Discharge   Problem: Activity: Goal: Ability to return to baseline activity level will improve Outcome: Adequate for Discharge   Problem: Cardiovascular: Goal: Ability to achieve and maintain adequate cardiovascular perfusion will improve Outcome: Adequate for Discharge Goal: Vascular access site(s) Level 0-1 will be maintained Outcome: Adequate for Discharge   Problem: Health Behavior/Discharge Planning: Goal: Ability to safely manage health-related needs after discharge will improve Outcome: Adequate for Discharge

## 2023-12-15 NOTE — Discharge Summary (Signed)
 Physician Discharge Summary  Tabitha Wilcox FMW:[id number] DOB: Nov 14, 1955 DOA: 12/12/2023  PCP: Tabitha Atlas, MD  Admit date: 12/12/2023 Discharge date: 12/15/2023  Admitted From: Home Disposition: Home  Recommendations for Outpatient Follow-up:  Follow up with PCP in 1-2 weeks Follow-up with cardiology 12/30/2023 Started on carvedilol 3.125 mg p.o. twice daily, Entresto 24-26 mg p.o. twice daily, Farxiga, atorvastatin 20 mg p.o. daily, Lasix 20 mg p.o. daily as needed for 3 pound weight gain in 24 hours or 5 pound weight gain in 1 week or if home weight exceeds 165 pounds   Home Health: No Equipment/Devices: None  Discharge Condition: Stable CODE STATUS: Full code Diet recommendation: Heart healthy diet  History of present illness:  Tabitha Wilcox is a 68 year old female with past medical history significant for anxiety/depression, asthma, carpal tunnel syndrome, right rotator cuff injury, sciatica, PUD, GERD, tobacco use disorder, seasonal allergies who presented to [facility] ED on 12/12/2023 with progressive shortness of breath, worse on exertion.  In the ED, temperature 98.4 F, HR 102, RR 19, BP 148/75, SpO2 94% on room air.  WBC 8.9, hemoglobin 10.2, platelet count 370.  Sodium 142, potassium 3.7, chloride 104, CO2 26, glucose 137, BUN 12, creatinine 0.70.  BNP 350.0.  High sensitive troponin less than 15.  UDS positive for THC.  Chest x-ray with cardiomegaly with worsening vascular congestion and moderate diffuse interstitial edema, increased perihilar opacities likely alveolar edema.  TRH was consulted for admission for further evaluation and management for concerns of acute/new CHF.  Hospital course:  Combined systolic/diastolic congestive heart failure, new diagnosis Patient presenting with progressive shortness of breath, worse with exertion.  Patient is afebrile without leukocytosis.  Chest x-ray with findings of cardiomegaly, vascular congestion.  Echocardiogram with LVEF 40-45%,  LV with global hypokinesis, grade 1 diastolic dysfunction, normal RV systolic function, LA mildly dilated, IVC normal in size.  Cardiology was consulted and followed during hospital course.  Patient was started on IV diuresis.  Underwent right/left heart catheterization with normal coronary arteries, normal LV filling pressures, normal right heart filling pressures, normal cardiac output.  Patient was started on carvedilol 3.125 mg p.o. twice daily, Farxiga 10 mg p.o. daily, Entresto 24-26 mg p.o. twice daily.  Starting Lasix 20 mg p.o. daily for weight gain/ankle edema.  Outpatient follow-up with cardiology scheduled on 12/30/2023.  HLD Aortic atherosclerosis Continue Lipitor 20 mg p.o. daily  Normocytic anemia Hemoglobin 11.1, stable.  Suppressed TSH TSH 0.205, free T4 1.06. Outpatient follow-up with PCP.  Asthma Initially thought to dyspnea related to asthma exacerbation, treated with IV Solu-Medrol , transition to prednisone  to complete 5-day course.  Continue albuterol  inhaler/nebulizer as needed.  GERD Continue omeprazole  20 mg p.o. daily.  Tobacco use disorder Continue to encourage abstinence/cessation.  Seasonal allergies Flonase   Discharge Diagnoses:  Principal Problem:   New onset of congestive heart failure (HCC) Active Problems:   Asthma, chronic   GERD (gastroesophageal reflux disease)   Tobacco abuse   Alcohol dependence in remission (HCC)   Pre-diabetes   Class 1 obesity   Normochromic anemia   Hyperlipidemia   Glaucoma    Discharge Instructions  Discharge Instructions     (HEART FAILURE PATIENTS) Call MD:  Anytime you have any of the following symptoms: 1) 3 pound weight gain in 24 hours or 5 pounds in 1 week 2) shortness of breath, with or without a dry hacking cough 3) swelling in the hands, feet or stomach 4) if you have to sleep on extra pillows at night  in order to breathe.   Complete by: As directed    Call MD for:  difficulty breathing, headache or  visual disturbances   Complete by: As directed    Call MD for:  extreme fatigue   Complete by: As directed    Call MD for:  persistant dizziness or light-headedness   Complete by: As directed    Call MD for:  persistant nausea and vomiting   Complete by: As directed    Call MD for:  temperature >100.4   Complete by: As directed    Diet - low sodium heart healthy   Complete by: As directed    Increase activity slowly   Complete by: As directed       Allergies as of 12/15/2023       Reactions   Hydrocodone  Itching   Oxycodone  Itching        Medication List     STOP taking these medications    amoxicillin  500 MG tablet Commonly known as: AMOXIL    celecoxib  200 MG capsule Commonly known as: CELEBREX    traMADol  50 MG tablet Commonly known as: ULTRAM        TAKE these medications    albuterol  (2.5 MG/3ML) 0.083% nebulizer solution Commonly known as: PROVENTIL  Take 3 mLs (2.5 mg total) by nebulization every 6 (six) hours as needed for wheezing or shortness of breath.   albuterol  108 (90 Base) MCG/ACT inhaler Commonly known as: ProAir  HFA Inhale 1-2 puffs into the lungs every 6 (six) hours as needed for wheezing or shortness of breath.   atorvastatin 20 MG tablet Commonly known as: LIPITOR Take 1 tablet (20 mg total) by mouth at bedtime.   CALCIUM  600+D PO Take 1 tablet by mouth every other day.   carvedilol 3.125 MG tablet Commonly known as: COREG Take 1 tablet (3.125 mg total) by mouth 2 (two) times daily with a meal.   dapagliflozin propanediol 10 MG Tabs tablet Commonly known as: FARXIGA Take 1 tablet (10 mg total) by mouth daily. Start taking on: December 16, 2023   diclofenac  Sodium 1 % Gel Commonly known as: Voltaren  Apply 2 g topically 4 (four) times daily. What changed:  when to take this reasons to take this   fluticasone  50 MCG/ACT nasal spray Commonly known as: FLONASE  Place 2 sprays into both nostrils in the morning and at bedtime.    furosemide 20 MG tablet Commonly known as: LASIX Take 1 tablet (20 mg total) by mouth daily as needed for fluid or edema (Weight gain 3lbs in one day). as needed for 3 pound weight gain in 24 hours or 5 pound weight gain in 1 week or if home weight exceeds 165 pounds What changed:  when to take this reasons to take this additional instructions   gabapentin  600 MG tablet Commonly known as: NEURONTIN  Take 600 mg by mouth 2 (two) times daily.   Krill Oil 350 MG Caps Take 350 mg by mouth every other day.   latanoprost  0.005 % ophthalmic solution Commonly known as: XALATAN  Place 1 drop into both eyes at bedtime.   MAGNESIUM  PO Take 1 tablet by mouth at bedtime.   meloxicam  15 MG tablet Commonly known as: MOBIC  Take 15 mg by mouth daily.   multivitamin with minerals Tabs tablet Take 1 tablet by mouth every other day. Centrum   omeprazole  20 MG capsule Commonly known as: PRILOSEC Take 1 capsule (20 mg total) by mouth daily before lunch.   OVER THE COUNTER MEDICATION Take 1  capsule by mouth daily. Sea moss capsules   predniSONE  10 MG tablet Commonly known as: DELTASONE  Take 4 tablets (40 mg total) by mouth daily for 2 days. Start taking on: December 16, 2023   sacubitril-valsartan 24-26 MG Commonly known as: ENTRESTO Take 1 tablet by mouth 2 (two) times daily.   timolol  0.25 % ophthalmic solution Commonly known as: TIMOPTIC  Place 1 drop into both eyes every morning.   tiZANidine  4 MG tablet Commonly known as: ZANAFLEX  Take 2-4 mg by mouth See admin instructions. Take 2 mg by mouth in the morning and 4 mg by mouth at bedtime.   triamcinolone cream 0.5 % Commonly known as: KENALOG Apply 1 Application topically 2 (two) times daily as needed (rash).        Follow-up Information     Tabitha Atlas, MD. Schedule an appointment as soon as possible for a visit in 1 week(s).   Specialty: Internal Medicine Contact information: [address] [place]  [address] [phone]         Vicci Rollo SAUNDERS, PA-C. Go on 12/30/2023.   Specialty: Cardiology Contact information: [address] [place] [address] [phone]                Allergies  Allergen Reactions   Hydrocodone  Itching   Oxycodone  Itching    Consultations: Cardiology   Procedures/Studies: CARDIAC CATHETERIZATION Result Date: 12/14/2023   LV end diastolic pressure is normal. Normal coronary arteries Normal LV filling pressures. LVEDP 12 mm Hg, PCWP 16/15, mean 14 mm Hg Normal right heart pressures. PAP 29/14, mean 24 mm Hg Normal cardiac output 5.42 L/min, index 3.15 Plan: medical management.   ECHOCARDIOGRAM COMPLETE Result Date: 12/13/2023    ECHOCARDIOGRAM REPORT   Patient Name:   SHARISA TOVES Date of Exam: 12/13/2023 Medical Rec #:  [id number]      Height:       61.0 in Accession #:    [id number]     Weight:       162.0 lb Date of Birth:  1955/07/11       BSA:          1.727 m Patient Age:    68 years       BP:           165/97 mmHg Patient Gender: F              HR:           86 bpm. Exam Location:  Inpatient Procedure: 2D Echo and Intracardiac Opacification Agent (Both Spectral and Color            Flow Doppler were utilized during procedure). Indications:    CHF  History:        Patient has no prior history of Echocardiogram examinations.                 CHF.  Sonographer:    Norleen Peter Referring Phys: [id number] DAVID MANUEL ORTIZ IMPRESSIONS  1. Left ventricular ejection fraction, by estimation, is 40 to 45%. The left ventricle has mildly decreased function. The left ventricle demonstrates global hypokinesis. Left ventricular diastolic parameters are consistent with Grade I diastolic dysfunction (impaired relaxation).  2. Right ventricular systolic function is normal. The right ventricular size is normal.  3. Left atrial size was mildly dilated.  4. The mitral valve is normal in structure. Trivial mitral valve regurgitation. No evidence of mitral  stenosis.  5. The aortic valve is normal in structure. Aortic valve regurgitation  is not visualized. No aortic stenosis is present.  6. The inferior vena cava is normal in size with greater than 50% respiratory variability, suggesting right atrial pressure of 3 mmHg. FINDINGS  Left Ventricle: Left ventricular ejection fraction, by estimation, is 40 to 45%. The left ventricle has mildly decreased function. The left ventricle demonstrates global hypokinesis. Definity contrast agent was given IV to delineate the left ventricular  endocardial borders. The left ventricular internal cavity size was normal in size. There is no left ventricular hypertrophy. Abnormal (paradoxical) septal motion, consistent with left bundle branch block. Left ventricular diastolic parameters are consistent with Grade I diastolic dysfunction (impaired relaxation). Right Ventricle: The right ventricular size is normal. No increase in right ventricular wall thickness. Right ventricular systolic function is normal. Left Atrium: Left atrial size was mildly dilated. Right Atrium: Right atrial size was normal in size. Pericardium: There is no evidence of pericardial effusion. Mitral Valve: The mitral valve is normal in structure. Trivial mitral valve regurgitation. No evidence of mitral valve stenosis. Tricuspid Valve: The tricuspid valve is normal in structure. Tricuspid valve regurgitation is trivial. No evidence of tricuspid stenosis. Aortic Valve: The aortic valve is normal in structure. Aortic valve regurgitation is not visualized. No aortic stenosis is present. Aortic valve mean gradient measures 6.0 mmHg. Aortic valve peak gradient measures 10.4 mmHg. Aortic valve area, by VTI measures 1.30 cm. Pulmonic Valve: The pulmonic valve was normal in structure. Pulmonic valve regurgitation is mild. No evidence of pulmonic stenosis. Aorta: The aortic root is normal in size and structure. Venous: The inferior vena cava is normal in size with greater  than 50% respiratory variability, suggesting right atrial pressure of 3 mmHg. IAS/Shunts: No atrial level shunt detected by color flow Doppler.  LEFT VENTRICLE PLAX 2D LVIDd:         5.20 cm      Diastology LVIDs:         4.20 cm      LV e' medial:    4.03 cm/s LV PW:         1.10 cm      LV E/e' medial:  26.6 LV IVS:        0.80 cm      LV e' lateral:   6.42 cm/s LVOT diam:     1.80 cm      LV E/e' lateral: 16.7 LV SV:         42 LV SV Index:   25 LVOT Area:     2.54 cm  LV Volumes (MOD) LV vol d, MOD A2C: 117.0 ml LV vol d, MOD A4C: 126.0 ml LV vol s, MOD A2C: 63.5 ml LV vol s, MOD A4C: 61.7 ml LV SV MOD A2C:     53.5 ml LV SV MOD A4C:     126.0 ml LV SV MOD BP:      62.3 ml RIGHT VENTRICLE             IVC RV Basal diam:  3.40 cm     IVC diam: 1.90 cm RV S prime:     22.50 cm/s TAPSE (M-mode): 2.2 cm LEFT ATRIUM             Index        RIGHT ATRIUM           Index LA diam:        3.60 cm 2.08 cm/m   RA Area:     15.70 cm LA Vol (A2C):  46.3 ml 26.81 ml/m  RA Volume:   44.40 ml  25.71 ml/m LA Vol (A4C):   60.4 ml 34.97 ml/m LA Biplane Vol: 56.6 ml 32.77 ml/m  AORTIC VALVE                     PULMONIC VALVE AV Area (Vmax):    1.41 cm      PV Vmax:       0.96 m/s AV Area (Vmean):   1.27 cm      PV Peak grad:  3.7 mmHg AV Area (VTI):     1.30 cm AV Vmax:           161.00 cm/s AV Vmean:          120.000 cm/s AV VTI:            0.326 m AV Peak Grad:      10.4 mmHg AV Mean Grad:      6.0 mmHg LVOT Vmax:         89.40 cm/s LVOT Vmean:        59.800 cm/s LVOT VTI:          0.167 m LVOT/AV VTI ratio: 0.51  AORTA Ao Root diam: 2.80 cm Ao Asc diam:  2.60 cm MITRAL VALVE MV Area (PHT): 5.42 cm     SHUNTS MV Decel Time: 140 msec     Systemic VTI:  0.17 m MV E velocity: 107.00 cm/s  Systemic Diam: 1.80 cm MV A velocity: 113.00 cm/s MV E/A ratio:  0.95 Oneil Parchment MD Electronically signed by Oneil Parchment MD Signature Date/Time: 12/13/2023/1:42:08 PM    Final    DG Chest 2 View Result Date: 12/12/2023 CLINICAL DATA:   Shortness of breath and coughing. EXAM: CHEST - 2 VIEW COMPARISON:  Chest PA and lateral 04/10/2023, 12/08/2022. FINDINGS: Cardiomegaly. There is worsening central vascular congestion, flow cephalization and increased moderate interstitial edema. There are increased perihilar opacities in both lungs which are most likely alveolar edema. Pneumonic component not strictly excluded. Small pleural effusions are forming, but not significantly changed. The mediastinum is normally outlined. No new osseous findings.  Mild thoracic spondylosis. IMPRESSION: 1. Cardiomegaly with worsening vascular congestion and moderate diffuse interstitial edema. 2. Increased perihilar opacities in both lungs which are most likely alveolar edema. Pneumonic component not strictly excluded. 3. Small pleural effusions. Electronically Signed   By: Francis Quam M.D.   On: 12/12/2023 06:50   DG Chest 2 View Result Date: 12/08/2023 CLINICAL DATA:  cough and sob EXAM: CHEST - 2 VIEW COMPARISON:  Chest x-ray 12/08/2022 FINDINGS: The heart and mediastinal contours are within normal limits. Atherosclerotic plaque. No focal consolidation. Pulmonary edema. No pleural effusion. No pneumothorax. No acute osseous abnormality. IMPRESSION: 1. Pulmonary edema. 2.  Aortic Atherosclerosis (ICD10-I70.0). Electronically Signed   By: Morgane  Naveau M.D.   On: 12/08/2023 17:51     Subjective: Patient seen examined bedside, lying in bed.  No complaints this morning.  Dyspnea much improved.  Seen by cardiology and okay for discharge with outpatient follow-up scheduled.  No other questions or concerns at this time.  Denies headache, no dizziness, no chest pain, no palpitations, no shortness of breath, no abdominal pain, no fever/chills/night sweats, no nausea/vomiting/diarrhea, no focal weakness, no fatigue, no paresthesia.  No acute events overnight per nursing staff.  Discharge Exam: Vitals:   12/15/23 0506 12/15/23 0900  BP: 114/75 118/77  Pulse: 84  77  Resp: 17 18  Temp: (!) 97.5 F (  36.4 C) 98.2 F (36.8 C)  SpO2: 97% 97%   Vitals:   12/15/23 0123 12/15/23 0500 12/15/23 0506 12/15/23 0900  BP: 90/62  114/75 118/77  Pulse: 80  84 77  Resp: 18  17 18   Temp: (!) 97.5 F (36.4 C)  (!) 97.5 F (36.4 C) 98.2 F (36.8 C)  TempSrc: Oral  Oral Oral  SpO2: 96%  97% 97%  Weight:  71.8 kg    Height:        Physical Exam: GEN: NAD, alert and oriented x 3, wd/wn HEENT: NCAT, PERRL, EOMI, sclera clear, MMM PULM: CTAB w/o wheezes/crackles, normal respiratory effort, room air CV: RRR w/o M/G/R GI: abd soft, NTND, + BS MSK: no peripheral edema, moves all extremities independently NEURO: No focal neurological deficit PSYCH: normal mood/affect Integumentary: dry/intact, no rashes or wounds    The results of significant diagnostics from this hospitalization (including imaging, microbiology, ancillary and laboratory) are listed below for reference.     Microbiology: No results found for this or any previous visit (from the past 240 hours).   Labs: BNP (last 3 results) No results for input(s): BNP in the last 8760 hours. Basic Metabolic Panel: Recent Labs  Lab 12/12/23 0619 12/13/23 0456 12/14/23 0520 12/14/23 1811  NA 142 136 137 137  K 3.7 4.5 3.6 4.9  CL 104 100 100  --   CO2 26 23 24   --   GLUCOSE 137* 128* 113*  --   BUN 12 21 26*  --   CREATININE 0.70 0.84 0.80  --   CALCIUM  9.3 10.2 10.1  --   MG  --   --  2.6*  --    Liver Function Tests: Recent Labs  Lab 12/13/23 0456  AST 28  ALT 21  ALKPHOS 113  BILITOT 0.7  PROT 7.9  ALBUMIN 4.7   No results for input(s): LIPASE, AMYLASE in the last 168 hours. No results for input(s): AMMONIA in the last 168 hours. CBC: Recent Labs  Lab 12/12/23 0619 12/13/23 0456 12/14/23 0520 12/14/23 1811  WBC 8.9 12.3* 10.8*  --   HGB 10.2* 10.9* 11.1* 12.9  HCT 32.3* 35.0* 34.4* 38.0  MCV 91.8 89.5 89.8  --   PLT 370 452* 447*  --    Cardiac Enzymes: No  results for input(s): CKTOTAL, CKMB, CKMBINDEX, TROPONINI in the last 168 hours. BNP: Invalid input(s): POCBNP CBG: No results for input(s): GLUCAP in the last 168 hours. D-Dimer No results for input(s): DDIMER in the last 72 hours. Hgb A1c Recent Labs    12/13/23 1041  HGBA1C 5.8*   Lipid Profile Recent Labs    12/14/23 0520  CHOL 191  HDL 86  LDLCALC 76  TRIG 146  CHOLHDL 2.2   Thyroid function studies Recent Labs    12/13/23 1041  TSH 0.205*   Anemia work up Recent Labs    12/13/23 1041  VITAMINB12 372  FOLATE 17.9  FERRITIN 76  TIBC 452*  IRON 74  RETICCTPCT 2.5   Urinalysis    Component Value Date/Time   COLORURINE AMBER (A) 12/23/2018 1111   APPEARANCEUR CLOUDY (A) 12/23/2018 1111   LABSPEC 1.019 12/23/2018 1111   PHURINE 5.0 12/23/2018 1111   GLUCOSEU NEGATIVE 12/23/2018 1111   HGBUR NEGATIVE 12/23/2018 1111   BILIRUBINUR NEGATIVE 12/23/2018 1111   KETONESUR 5 (A) 12/23/2018 1111   PROTEINUR NEGATIVE 12/23/2018 1111   UROBILINOGEN 0.2 08/14/2014 1907   NITRITE NEGATIVE 12/23/2018 1111   LEUKOCYTESUR TRACE (A)  12/23/2018 1111   Sepsis Labs Recent Labs  Lab 12/12/23 0619 12/13/23 0456 12/14/23 0520  WBC 8.9 12.3* 10.8*   Microbiology No results found for this or any previous visit (from the past 240 hours).   Time coordinating discharge: Over 30 minutes  SIGNED:   Camellia PARAS [place], DO  [facility] 12/15/2023, 2:46 PM

## 2023-12-15 NOTE — Plan of Care (Signed)
  Problem: Education: Goal: Individualized Educational Video(s) Outcome: Progressing   Problem: Activity: Goal: Capacity to carry out activities will improve Outcome: Progressing   Problem: Cardiac: Goal: Ability to achieve and maintain adequate cardiopulmonary perfusion will improve Outcome: Progressing   Problem: Education: Goal: Knowledge of disease or condition will improve Outcome: Progressing

## 2023-12-15 NOTE — Discharge Instructions (Addendum)
 [facility] Resources for counseling Address: [address] [place], [place], [place] [address] Phone: [phone]  Open ? Closes 4:30?PM   Radial Site Care Refer to this sheet in the next few weeks. These instructions provide you with information on caring for yourself after your procedure. Your caregiver may also give you more specific instructions. Your treatment has been planned according to current medical practices, but problems sometimes occur. Call your caregiver if you have any problems or questions after your procedure. HOME CARE INSTRUCTIONS You may shower the day after the procedure. Remove the bandage (dressing) and gently wash the site with plain soap and water . Gently pat the site dry.  Do not apply powder or lotion to the site.  Do not submerge the affected site in water  for 3 to 5 days.  Inspect the site at least twice daily.  Do not flex or bend the affected arm for 24 hours.  No lifting over 5 pounds (2.3 kg) for 5 days after your procedure.  Do not drive home if you are discharged the same day of the procedure. Have someone else drive you.  You may drive 24 hours after the procedure unless otherwise instructed by your caregiver.  What to expect: Any bruising will usually fade within 1 to 2 weeks.  Blood that collects in the tissue (hematoma) may be painful to the touch. It should usually decrease in size and tenderness within 1 to 2 weeks.  SEEK IMMEDIATE MEDICAL CARE IF: You have unusual pain at the radial site.  You have redness, warmth, swelling, or pain at the radial site.  You have drainage (other than a small amount of blood on the dressing).  You have chills.  You have a fever or persistent symptoms for more than 72 hours.  You have a fever and your symptoms suddenly get worse.  Your arm becomes pale, cool, tingly, or numb.  You have heavy bleeding from the site. Hold pressure on the site.

## 2023-12-15 NOTE — TOC Transition Note (Signed)
 Transition of Care [facility]) - Discharge Note   Patient Details  Name: Tabitha Wilcox MRN: [id number] Date of Birth: 15-Aug-1955  Transition of Care [facility]) CM/SW Contact:  Bascom Service, RN Phone Number: 12/15/2023, 3:28 PM   Clinical Narrative: d/c home no orders or CM needs. See prior note for additional info.      Final next level of care: Home/Self Care Barriers to Discharge: No Barriers Identified   Patient Goals and CMS Choice Patient states their goals for this hospitalization and ongoing recovery are:: Home CMS Medicare.gov Compare Post Acute Care list provided to:: Patient Choice offered to / list presented to : Patient [place] ownership interest in [facility].provided to:: Patient    Discharge Placement                       Discharge Plan and Services Additional resources added to the After Visit Summary for     Discharge Planning Services: CM Consult                                 Social Drivers of Health (SDOH) Interventions SDOH Screenings   Food Insecurity: No Food Insecurity (12/12/2023)  Housing: Low Risk  (12/12/2023)  Transportation Needs: No Transportation Needs (12/12/2023)  Utilities: Not At Risk (12/12/2023)  Social Connections: Patient Declined (12/12/2023)  Tobacco Use: Medium Risk (12/12/2023)     Readmission Risk Interventions     No data to display

## 2023-12-15 NOTE — Progress Notes (Signed)
 IVs removed, dressed, instructions reviewed with understanding verbalized, belongings packed, transferred to front entrance via wheelchair.

## 2023-12-15 NOTE — TOC Progression Note (Signed)
 Transition of Care [facility]) - Progression Note    Patient Details  Name: Tabitha Wilcox MRN: [id number] Date of Birth: 1955-05-16  Transition of Care [facility]) CM/SW Contact  Tawana Pasch, Nathanel, RN Phone Number: 12/15/2023, 10:21 AM  Clinical Narrative: Spoke to patient about d/c plans-she had concerns about housing,additional asst @ home,counseling for stress-provided with Dept of social service resource list/shelter list/family justice center info provided/private duty agencies info given for patient to f/u. Has own transport home.     Expected Discharge Plan: Home/Self Care Barriers to Discharge: Continued Medical Work up               Expected Discharge Plan and Services   Discharge Planning Services: CM Consult   Living arrangements for the past 2 months: Single Family Home                                       Social Drivers of Health (SDOH) Interventions SDOH Screenings   Food Insecurity: No Food Insecurity (12/12/2023)  Housing: Low Risk  (12/12/2023)  Transportation Needs: No Transportation Needs (12/12/2023)  Utilities: Not At Risk (12/12/2023)  Social Connections: Patient Declined (12/12/2023)  Tobacco Use: Medium Risk (12/12/2023)    Readmission Risk Interventions     No data to display

## 2023-12-16 NOTE — Progress Notes (Signed)
 Cardiology Office Note   Date:  12/16/2023  ID:  Tabitha Wilcox, DOB 02/05/1956, MRN [id number] PCP: Shelda Atlas, MD  [facility] Providers Cardiologist:  Jerel Balding, MD    History of Present Illness Tabitha Wilcox is a 68 y.o. adult with a past medical history of CHF, HLD, Htn, anxiety, depression, remote bilateral carpal tunnel, asthma, GERD, tobacco use. Patient followed by Dr. Balding and presents today for a hospital follow up appointment   Patient recently admitted 10/5-10/8 with CHF. She had presented with worsening shortness of breath. Underwent echocardiogram that showed EF 40-45%, grade I DD, normal RV systolic function, no significant valvular abnormalities. Treated with IV lasix. Underwent R/L heart cath 12/14/23 that showed normal coronary arteries, normal LV and right heart pressures, normal cardiac output. Discharged on Lipitor, carvedilol, Farxiga, Entresto  Today, patient presents for a hospital follow-up appointment.  She reports that she has been doing very well since getting out of the hospital.  She has done a great job of tracking her weights.  Weight has been stable.  She denies shortness of breath, lower extremity swelling, orthopnea.  Denies chest pain.  She has not needed to take any of her as needed Lasix.  She is focusing on reducing salt intake in her diet.  She had labs checked by her primary care provider on Monday, potassium returned elevated at 6.3 and she has been started on Lokelma.  She does not have plans to get labs rechecked at this time.  She tells me that she checks her blood pressure at home, her blood pressure is often elevated. However, BP has been well-controlled in doctors offices recently.  With her primary care provider on Monday, BP was 103/67.  BP today 101/62.  She tells me that her blood pressure cuff might not be accurate.  We arranged a nursing visit on Monday to check her blood pressure cuff.  Also arranged for BMP on Monday.  She  does not need any refills of her medications.  We reviewed low-sodium diet and as needed indication for Lasix   Studies Reviewed Cardiac Studies & Procedures   ______________________________________________________________________________________________ CARDIAC CATHETERIZATION  CARDIAC CATHETERIZATION 12/14/2023  Conclusion   LV end diastolic pressure is normal.  Normal coronary arteries Normal LV filling pressures. LVEDP 12 mm Hg, PCWP 16/15, mean 14 mm Hg Normal right heart pressures. PAP 29/14, mean 24 mm Hg Normal cardiac output 5.42 L/min, index 3.15  Plan: medical management.  Findings Coronary Findings Diagnostic  Dominance: Right  Left Main Vessel was injected. Vessel is normal in caliber. Vessel is angiographically normal.  Left Anterior Descending Vessel was injected. Vessel is normal in caliber. Vessel is angiographically normal.  Left Circumflex Vessel was injected. Vessel is normal in caliber. Vessel is angiographically normal.  Right Coronary Artery Vessel was injected. Vessel is normal in caliber. Vessel is angiographically normal.  Intervention  No interventions have been documented.     ECHOCARDIOGRAM  ECHOCARDIOGRAM COMPLETE 12/13/2023  Narrative ECHOCARDIOGRAM REPORT    Patient Name:   Tabitha Wilcox Date of Exam: 12/13/2023 Medical Rec #:  [id number]      Height:       61.0 in Accession #:    [id number]     Weight:       162.0 lb Date of Birth:  30-Jul-1955       BSA:          1.727 m Patient Age:    28 years  BP:           165/97 mmHg Patient Gender: F              HR:           86 bpm. Exam Location:  Inpatient  Procedure: 2D Echo and Intracardiac Opacification Agent (Both Spectral and Color Flow Doppler were utilized during procedure).  Indications:    CHF  History:        Patient has no prior history of Echocardiogram examinations. CHF.  Sonographer:    Norleen Peter Referring Phys: [id number] DAVID MANUEL  ORTIZ  IMPRESSIONS   1. Left ventricular ejection fraction, by estimation, is 40 to 45%. The left ventricle has mildly decreased function. The left ventricle demonstrates global hypokinesis. Left ventricular diastolic parameters are consistent with Grade I diastolic dysfunction (impaired relaxation). 2. Right ventricular systolic function is normal. The right ventricular size is normal. 3. Left atrial size was mildly dilated. 4. The mitral valve is normal in structure. Trivial mitral valve regurgitation. No evidence of mitral stenosis. 5. The aortic valve is normal in structure. Aortic valve regurgitation is not visualized. No aortic stenosis is present. 6. The inferior vena cava is normal in size with greater than 50% respiratory variability, suggesting right atrial pressure of 3 mmHg.  FINDINGS Left Ventricle: Left ventricular ejection fraction, by estimation, is 40 to 45%. The left ventricle has mildly decreased function. The left ventricle demonstrates global hypokinesis. Definity contrast agent was given IV to delineate the left ventricular endocardial borders. The left ventricular internal cavity size was normal in size. There is no left ventricular hypertrophy. Abnormal (paradoxical) septal motion, consistent with left bundle branch block. Left ventricular diastolic parameters are consistent with Grade I diastolic dysfunction (impaired relaxation).  Right Ventricle: The right ventricular size is normal. No increase in right ventricular wall thickness. Right ventricular systolic function is normal.  Left Atrium: Left atrial size was mildly dilated.  Right Atrium: Right atrial size was normal in size.  Pericardium: There is no evidence of pericardial effusion.  Mitral Valve: The mitral valve is normal in structure. Trivial mitral valve regurgitation. No evidence of mitral valve stenosis.  Tricuspid Valve: The tricuspid valve is normal in structure. Tricuspid valve regurgitation is  trivial. No evidence of tricuspid stenosis.  Aortic Valve: The aortic valve is normal in structure. Aortic valve regurgitation is not visualized. No aortic stenosis is present. Aortic valve mean gradient measures 6.0 mmHg. Aortic valve peak gradient measures 10.4 mmHg. Aortic valve area, by VTI measures 1.30 cm.  Pulmonic Valve: The pulmonic valve was normal in structure. Pulmonic valve regurgitation is mild. No evidence of pulmonic stenosis.  Aorta: The aortic root is normal in size and structure.  Venous: The inferior vena cava is normal in size with greater than 50% respiratory variability, suggesting right atrial pressure of 3 mmHg.  IAS/Shunts: No atrial level shunt detected by color flow Doppler.   LEFT VENTRICLE PLAX 2D LVIDd:         5.20 cm      Diastology LVIDs:         4.20 cm      LV e' medial:    4.03 cm/s LV PW:         1.10 cm      LV E/e' medial:  26.6 LV IVS:        0.80 cm      LV e' lateral:   6.42 cm/s LVOT diam:     1.80 cm  LV E/e' lateral: 16.7 LV SV:         42 LV SV Index:   25 LVOT Area:     2.54 cm  LV Volumes (MOD) LV vol d, MOD A2C: 117.0 ml LV vol d, MOD A4C: 126.0 ml LV vol s, MOD A2C: 63.5 ml LV vol s, MOD A4C: 61.7 ml LV SV MOD A2C:     53.5 ml LV SV MOD A4C:     126.0 ml LV SV MOD BP:      62.3 ml  RIGHT VENTRICLE             IVC RV Basal diam:  3.40 cm     IVC diam: 1.90 cm RV S prime:     22.50 cm/s TAPSE (M-mode): 2.2 cm  LEFT ATRIUM             Index        RIGHT ATRIUM           Index LA diam:        3.60 cm 2.08 cm/m   RA Area:     15.70 cm LA Vol (A2C):   46.3 ml 26.81 ml/m  RA Volume:   44.40 ml  25.71 ml/m LA Vol (A4C):   60.4 ml 34.97 ml/m LA Biplane Vol: 56.6 ml 32.77 ml/m AORTIC VALVE                     PULMONIC VALVE AV Area (Vmax):    1.41 cm      PV Vmax:       0.96 m/s AV Area (Vmean):   1.27 cm      PV Peak grad:  3.7 mmHg AV Area (VTI):     1.30 cm AV Vmax:           161.00 cm/s AV Vmean:           120.000 cm/s AV VTI:            0.326 m AV Peak Grad:      10.4 mmHg AV Mean Grad:      6.0 mmHg LVOT Vmax:         89.40 cm/s LVOT Vmean:        59.800 cm/s LVOT VTI:          0.167 m LVOT/AV VTI ratio: 0.51  AORTA Ao Root diam: 2.80 cm Ao Asc diam:  2.60 cm  MITRAL VALVE MV Area (PHT): 5.42 cm     SHUNTS MV Decel Time: 140 msec     Systemic VTI:  0.17 m MV E velocity: 107.00 cm/s  Systemic Diam: 1.80 cm MV A velocity: 113.00 cm/s MV E/A ratio:  0.95  Oneil Parchment MD Electronically signed by Oneil Parchment MD Signature Date/Time: 12/13/2023/1:42:08 PM    Final          ______________________________________________________________________________________________      Risk Assessment/Calculations           Physical Exam VS:  There were no vitals taken for this visit.       Wt Readings from Last 3 Encounters:  12/15/23 158 lb 4.6 oz (71.8 kg)  12/08/23 182 lb 1.6 oz (82.6 kg)  12/21/22 182 lb (82.6 kg)    GEN: Well nourished, well developed in no acute distress.  Sitting comfortably on the exam table NECK: No JVD  CARDIAC:  RRR, no murmurs, rubs, gallops RESPIRATORY:  Clear to auscultation without rales, wheezing or rhonchi.  Normal work  breathing on room air ABDOMEN: Soft, non-tender, non-distended EXTREMITIES:  No edema in bilateral lower extremities; No deformity   ASSESSMENT AND PLAN  Chronic HFmrEF  LBBB  - Patient recently admitted with new onset CHF. Presented with SOB. Treated with IV lasix with good response  - Echocardiogram 12/13/23 showed EF 40-45%, grade I DD, normal RV systolic function, no significant valvular abnormalities  - R/L heart cath 10/7 showed normal coronary arteries, normal LV filling pressures, normal R heart filling pressures, normal cardiac output  - Patient tells me she has been doing very well since getting out of the hospital.  Notes improved energy.  No shortness of breath, chest pain, lower extremity swelling. Tolerating GDMT   -Patient checks her weight every day, this has been stable.  She has not had to take any as needed Lasix -Euvolemic on exam today -Continue as needed Lasix 20 mg PRN  -We reviewed indications for as needed Lasix.  Reviewed low sodium diet  - Continue carvedilol 3.125 mg BID, farxiga 10 mg daily, entresto 24-26 mg BID - Ordered repeat echo to be completed in 2 months.  Follow-up with Dr. JAYSON in 3 months after echo   Hyperkalemia  -Patient had labs drawn through her primary care office [facility]) on 10/20.  She brought labs for review today. -Labs showed creatinine 1.11, potassium 6.3. No history of hyperkalemia  - Scanned labs into chart  - Her primary care provider has already started her on a 4-day course of Lokelma - Arranged repeat BMP on Monday 10/27  - As long as potassium is within normal limits on recheck BMP Monday, plan to continue current GDMT as above. Repeat BMP in 2 weeks after Lokelma course  Hypertension -BP today 101/62.  BP at primary care office on Monday 103/67 -Patient thinks that her home blood pressure cuff is inaccurate.  She has been getting some readings in the 150s systolic. -Arrange nursing visit to check BP cuff on Monday at time with BMP   HLD  Aortic Atherosclerosis  -Lipid panel from 12/2023 showed LDL 76, HDL 86, triglycerides 146, total cholesterol 191 - Continue lipitor 20 mg daily    Dispo: Nursing visit Monday for BMP and blood pressure cuff calibration.  Follow-up with Dr. Francyne in 3 months  Signed, Rollo FABIENE Louder, PA-C

## 2023-12-28 LAB — LAB REPORT - SCANNED
Albumin, Urine POC: 0.2
Creatinine, POC: 62 mg/dL
EGFR: 54
Microalb Creat Ratio: 3

## 2023-12-30 ENCOUNTER — Encounter: Payer: Self-pay | Admitting: Cardiology

## 2023-12-30 ENCOUNTER — Ambulatory Visit: Attending: Internal Medicine | Admitting: Cardiology

## 2023-12-30 VITALS — BP 101/62 | HR 92 | Ht 61.0 in | Wt 163.2 lb

## 2023-12-30 DIAGNOSIS — I447 Left bundle-branch block, unspecified: Secondary | ICD-10-CM

## 2023-12-30 DIAGNOSIS — I5022 Chronic systolic (congestive) heart failure: Secondary | ICD-10-CM

## 2023-12-30 DIAGNOSIS — E875 Hyperkalemia: Secondary | ICD-10-CM

## 2023-12-30 DIAGNOSIS — I10 Essential (primary) hypertension: Secondary | ICD-10-CM

## 2023-12-30 DIAGNOSIS — E785 Hyperlipidemia, unspecified: Secondary | ICD-10-CM

## 2023-12-30 NOTE — Patient Instructions (Addendum)
 Medication Instructions:   Your physician recommends that you continue on your current medications as directed. Please refer to the Current Medication list given to you today.    *If you need a refill on your cardiac medications before your next appointment, please call your pharmacy*   Lab Work:   PLEASE GO DOWN STAIRS  [facility]  FIRST FLOOR   ( GET OFF ELEVATORS WALK TOWARDS WAITING AREA LAB LOCATED BY PHARMACY):  RETURN FOR BMET  ON MONDAY      If you have labs (blood work) drawn today and your tests are completely normal, you will receive your results only by: MyChart Message (if you have MyChart) OR A paper copy in the mail If you have any lab test that is abnormal or we need to change your treatment, we will call you to review the results.   Testing/Procedures: IN 2 MONTHS  Your physician has requested that you have an echocardiogram. Echocardiography is a painless test that uses sound waves to create images of your heart. It provides your doctor with information about the size and shape of your heart and how well your heart's chambers and valves are working. This procedure takes approximately one hour. There are no restrictions for this procedure. Please do NOT wear cologne, perfume, aftershave, or lotions (deodorant is allowed). Please arrive 15 minutes prior to your appointment time.  Please note: We ask at that you not bring children with you during ultrasound (echo/ vascular) testing. Due to room size and safety concerns, children are not allowed in the ultrasound rooms during exams. Our front office staff cannot provide observation of children in our lobby area while testing is being conducted. An adult accompanying a patient to their appointment will only be allowed in the ultrasound room at the discretion of the ultrasound technician under special circumstances. We apologize for any inconvenience.     Follow-Up: At [facility], you and your health needs are our  priority.  As part of our continuing mission to provide you with exceptional heart care, our providers are all part of one team.  This team includes your primary Cardiologist (physician) and Advanced Practice Providers or APPs (Physician Assistants and Nurse Practitioners) who all work together to provide you with the care you need, when you need it.  Your next appointment:  NURSE VISIT NEXT WEEK  MONDAY   FOR  BLOOD PRESSURE CHECK AND BLOOD PRESSURE MACHINE COMPARE  3 month(s)  Provider:    Jerel Balding, MD     We recommend signing up for the patient portal called MyChart.  Sign up information is provided on this After Visit Summary.  MyChart is used to connect with patients for Virtual Visits (Telemedicine).  Patients are able to view lab/test results, encounter notes, upcoming appointments, etc.  Non-urgent messages can be sent to your provider as well.   To learn more about what you can do with MyChart, go to [email or web address].   Other Instructions

## 2024-01-04 ENCOUNTER — Ambulatory Visit: Payer: Self-pay | Admitting: Cardiology

## 2024-01-04 DIAGNOSIS — E875 Hyperkalemia: Secondary | ICD-10-CM

## 2024-01-04 LAB — BASIC METABOLIC PANEL WITH GFR
BUN/Creatinine Ratio: 11 — ABNORMAL LOW (ref 12–28)
BUN: 10 mg/dL (ref 8–27)
CO2: 22 mmol/L (ref 20–29)
Calcium: 10 mg/dL (ref 8.7–10.3)
Chloride: 102 mmol/L (ref 96–106)
Creatinine, Ser: 0.93 mg/dL (ref 0.57–1.00)
Glucose: 110 mg/dL — ABNORMAL HIGH (ref 70–99)
Potassium: 4.8 mmol/L (ref 3.5–5.2)
Sodium: 140 mmol/L (ref 134–144)
eGFR: 67 mL/min/1.73 (ref >59)

## 2024-01-05 ENCOUNTER — Ambulatory Visit: Attending: Cardiology | Admitting: Cardiology

## 2024-01-05 ENCOUNTER — Other Ambulatory Visit (HOSPITAL_COMMUNITY): Payer: Self-pay

## 2024-01-05 VITALS — BP 124/72 | HR 92 | Resp 95 | Ht 61.0 in | Wt 164.0 lb

## 2024-01-05 DIAGNOSIS — I10 Essential (primary) hypertension: Secondary | ICD-10-CM | POA: Diagnosis not present

## 2024-01-05 MED ORDER — OMRON 7 SERIES BP MONITOR DEVI
0 refills | Status: AC
  Filled 2024-01-05: qty 1, 30d supply, fill #0

## 2024-01-05 NOTE — Progress Notes (Signed)
   Nurse Visit   Date of Encounter: 01/05/2024 ID: Tabitha Wilcox, DOB 07/11/1955, MRN [id number]  PCP:  Arloa Jarvis, NP   [facility] Providers Cardiologist:  Jerel Balding, MD {    Visit Details   VS:  BP 124/72 Comment: patient machine 150 80  Pulse 92   Resp (!) 95   Ht 5' 1 (1.549 m)   Wt 164 lb (74.4 kg)   BMI 30.99 kg/m  , BMI Body mass index is 30.99 kg/m.  Wt Readings from Last 3 Encounters:  01/05/24 164 lb (74.4 kg)  12/30/23 163 lb 3.2 oz (74 kg)  12/15/23 158 lb 4.6 oz (71.8 kg)     Reason for visit: Check blood pressure and calibrate home blood pressure cuff Performed today: Vitals Changes (medications, testing, etc.) : recommended pt replace old blood pressure cuff Length of Visit: 10 minutes    Medications Adjustments/Labs and Tests Ordered: No orders of the defined types were placed in this encounter.  Meds ordered this encounter  Medications   Blood Pressure Monitoring (OMRON 7 SERIES BP MONITOR) DEVI    Sig: Check blood pressure 2 hours after blood pressure medication    Dispense:  1 each    Refill:  0     Signed, Lakeishia Truluck L, RN  01/05/2024 11:42 AM

## 2024-01-05 NOTE — Patient Instructions (Signed)
 Thank you for choosing [facility]!     Medication Instructions:  Check your blood pressure 2 hours after taking your blood pressure medication.  A prescription was sent the pharmacy for a new blood pressure cuff.    *If you need a refill on your cardiac medications before your next appointment, please call your pharmacy*   Lab Work: No labs were ordered during today's visit.  If you have labs (blood work) drawn today and your tests are completely normal, you will receive your results only by: MyChart Message (if you have MyChart) OR A paper copy in the mail If you have any lab test that is abnormal or we need to change your treatment, we will call you to review the results.   Testing/Procedures: No procedures were ordered during today's visit.    Follow-Up: At [facility], you and your health needs are our priority.  As part of our continuing mission to provide you with exceptional heart care, we have created designated Provider Care Teams.  These Care Teams include your primary Cardiologist (physician) and Advanced Practice Providers (APPs -  Physician Assistants and Nurse Practitioners) who all work together to provide you with the care you need, when you need it. We recommend signing up for the patient portal called MyChart.  Sign up information is provided on this After Visit Summary.  MyChart is used to connect with patients for Virtual Visits (Telemedicine).  Patients are able to view lab/test results, encounter notes, upcoming appointments, etc.  Non-urgent messages can be sent to your provider as well.   To learn more about what you can do with MyChart, go to [email or web address].

## 2024-01-18 ENCOUNTER — Ambulatory Visit: Payer: Self-pay | Admitting: Cardiology

## 2024-01-18 LAB — LAB REPORT - SCANNED: EGFR: 80

## 2024-02-18 ENCOUNTER — Other Ambulatory Visit: Payer: Self-pay | Admitting: Nurse Practitioner

## 2024-02-18 DIAGNOSIS — Z1231 Encounter for screening mammogram for malignant neoplasm of breast: Secondary | ICD-10-CM

## 2024-02-28 ENCOUNTER — Ambulatory Visit (HOSPITAL_COMMUNITY)
Admission: RE | Admit: 2024-02-28 | Discharge: 2024-02-28 | Disposition: A | Discharge status: Home / Self Care | Source: Ambulatory Visit | Attending: Cardiology | Admitting: Cardiology

## 2024-02-28 DIAGNOSIS — I5022 Chronic systolic (congestive) heart failure: Secondary | ICD-10-CM | POA: Insufficient documentation

## 2024-02-28 MED ORDER — PERFLUTREN LIPID MICROSPHERE
1.0000 mL | INTRAVENOUS | Status: AC | PRN
  Administered 2024-02-28: 2 mL via INTRAVENOUS

## 2024-03-01 LAB — ECHOCARDIOGRAM COMPLETE
AR max vel: 2.22 cm2
AV Area VTI: 2.09 cm2
AV Area mean vel: 2.16 cm2
AV Mean grad: 5 mmHg
AV Peak grad: 8.8 mmHg
Ao pk vel: 1.48 m/s
Area-P 1/2: 4.15 cm2
S' Lateral: 4.05 cm

## 2024-03-06 NOTE — Progress Notes (Signed)
 Spoke with patient regarding echo. She will be in to have labs drawn this week.

## 2024-03-07 ENCOUNTER — Other Ambulatory Visit: Payer: Self-pay

## 2024-03-07 DIAGNOSIS — E875 Hyperkalemia: Secondary | ICD-10-CM

## 2024-03-07 LAB — BASIC METABOLIC PANEL WITH GFR
BUN/Creatinine Ratio: 23 (ref 12–28)
BUN: 26 mg/dL (ref 8–27)
CO2: 19 mmol/L — ABNORMAL LOW (ref 20–29)
Calcium: 10.2 mg/dL (ref 8.7–10.3)
Chloride: 104 mmol/L (ref 96–106)
Creatinine, Ser: 1.11 mg/dL — ABNORMAL HIGH (ref 0.57–1.00)
Glucose: 89 mg/dL (ref 70–99)
Potassium: 5.8 mmol/L (ref 3.5–5.2)
Sodium: 138 mmol/L (ref 134–144)
eGFR: 54 mL/min/1.73 — ABNORMAL LOW

## 2024-03-08 ENCOUNTER — Ambulatory Visit: Payer: Self-pay | Admitting: Cardiology

## 2024-03-08 DIAGNOSIS — E875 Hyperkalemia: Secondary | ICD-10-CM

## 2024-03-08 MED ORDER — LOKELMA 10 G PO PACK: 10.0000 g | PACK | Freq: Two times a day (BID) | ORAL | 0 refills | Status: AC

## 2024-03-11 LAB — BASIC METABOLIC PANEL WITH GFR
BUN/Creatinine Ratio: 22 (ref 12–28)
BUN: 22 mg/dL (ref 8–27)
CO2: 24 mmol/L (ref 20–29)
Calcium: 9.8 mg/dL (ref 8.7–10.3)
Chloride: 101 mmol/L (ref 96–106)
Creatinine, Ser: 1.01 mg/dL — ABNORMAL HIGH (ref 0.57–1.00)
Glucose: 96 mg/dL (ref 70–99)
Potassium: 4.8 mmol/L (ref 3.5–5.2)
Sodium: 138 mmol/L (ref 134–144)
eGFR: 61 mL/min/1.73

## 2024-03-13 ENCOUNTER — Other Ambulatory Visit: Payer: Self-pay | Admitting: Cardiovascular Disease

## 2024-03-13 NOTE — Telephone Encounter (Signed)
 PT returning call to nurse

## 2024-03-14 ENCOUNTER — Other Ambulatory Visit: Payer: Self-pay

## 2024-03-14 MED ORDER — DAPAGLIFLOZIN PROPANEDIOL 10 MG PO TABS: 10.0000 mg | ORAL_TABLET | Freq: Every day | ORAL | 3 refills | Status: AC

## 2024-03-16 ENCOUNTER — Ambulatory Visit
Admission: RE | Admit: 2024-03-16 | Discharge: 2024-03-16 | Disposition: A | Discharge status: Home / Self Care | Source: Ambulatory Visit | Attending: Nurse Practitioner | Admitting: Nurse Practitioner

## 2024-03-16 DIAGNOSIS — Z1231 Encounter for screening mammogram for malignant neoplasm of breast: Secondary | ICD-10-CM

## 2024-03-17 ENCOUNTER — Telehealth: Payer: Self-pay | Admitting: Cardiovascular Disease

## 2024-03-17 MED ORDER — CARVEDILOL 3.125 MG PO TABS: 3.1250 mg | ORAL_TABLET | Freq: Two times a day (BID) | ORAL | 3 refills | Status: AC

## 2024-03-17 NOTE — Telephone Encounter (Signed)
 RX sent in.

## 2024-03-17 NOTE — Telephone Encounter (Signed)
" °*  STAT* If patient is at the pharmacy, call can be transferred to refill team.   1. Which medications need to be refilled? (please list name of each medication and dose if known)   carvedilol  (COREG ) 3.125 MG tablet (Expired)    2. Which pharmacy/location (including street and city if local pharmacy) is medication to be sent to?  [facility] - [place], [place] - [address]      3. Do they need a 30 day or 90 day supply? 90 day    Pt is out of medication  "

## 2024-04-07 ENCOUNTER — Ambulatory Visit: Attending: Cardiovascular Disease | Admitting: Cardiovascular Disease

## 2024-04-07 ENCOUNTER — Encounter: Payer: Self-pay | Admitting: Cardiovascular Disease

## 2024-04-07 VITALS — BP 104/60 | HR 90 | Ht 61.0 in | Wt 164.0 lb

## 2024-04-07 DIAGNOSIS — Z79899 Other long term (current) drug therapy: Secondary | ICD-10-CM | POA: Diagnosis not present

## 2024-04-07 DIAGNOSIS — I5022 Chronic systolic (congestive) heart failure: Secondary | ICD-10-CM

## 2024-04-07 MED ORDER — LOSARTAN POTASSIUM 25 MG PO TABS: 25.0000 mg | ORAL_TABLET | Freq: Every day | ORAL | 3 refills | Status: AC

## 2024-04-07 NOTE — Patient Instructions (Signed)
 Medication Instructions:  Losartan  25 mg daily *If you need a refill on your cardiac medications before your next appointment, please call your pharmacy*  Lab Work: BMP, BNP- 2-3 weeks If you have labs (blood work) drawn today and your tests are completely normal, you will receive your results only by: MyChart Message (if you have MyChart) OR A paper copy in the mail If you have any lab test that is abnormal or we need to change your treatment, we will call you to review the results.  Testing/Procedures: Your physician has requested that you have an echocardiogram in 6 weeks. Echocardiography is a painless test that uses sound waves to create images of your heart. It provides your doctor with information about the size and shape of your heart and how well your hearts chambers and valves are working. This procedure takes approximately one hour. There are no restrictions for this procedure. Please do NOT wear cologne, perfume, aftershave, or lotions (deodorant is allowed). Please arrive 15 minutes prior to your appointment time.  Please note: We ask at that you not bring children with you during ultrasound (echo/ vascular) testing. Due to room size and safety concerns, children are not allowed in the ultrasound rooms during exams. Our front office staff cannot provide observation of children in our lobby area while testing is being conducted. An adult accompanying a patient to their appointment will only be allowed in the ultrasound room at the discretion of the ultrasound technician under special circumstances. We apologize for any inconvenience.   Follow-Up: At [facility], you and your health needs are our priority.  As part of our continuing mission to provide you with exceptional heart care, our providers are all part of one team.  This team includes your primary Cardiologist (physician) and Advanced Practice Providers or APPs (Physician Assistants and Nurse Practitioners) who all  work together to provide you with the care you need, when you need it.  Your next appointment:    Rollo Louder, PA-C- 6 weeks- after the Echo  Dr Francyne- 3-4 months  We recommend signing up for the patient portal called MyChart.  Sign up information is provided on this After Visit Summary.  MyChart is used to connect with patients for Virtual Visits (Telemedicine).  Patients are able to view lab/test results, encounter notes, upcoming appointments, etc.  Non-urgent messages can be sent to your provider as well.   To learn more about what you can do with MyChart, go to [email or web address].

## 2024-04-08 NOTE — Progress Notes (Signed)
 " Cardiology Office Note   Date:  04/08/2024  ID:  Tabitha Wilcox, DOB [birth year], MRN [id number] PCP: Arloa Jarvis, NP  [facility] Providers Cardiologist:  Jerel Balding, MD     History of Present Illness Tabitha Wilcox is a 69 y.o. adult recently diagnosed with heart failure (mildly reduced EF 40-45%) during hospitalization in October 2025.  She has a history of left bundle branch block, prediabetes, hyperlipidemia, mild HTN, remote history of smoking and vaping, PUD and GERD, sciatica, bilateral carpal tunnel syndrome, anxiety and depression.  She was treated for asthma for quite a while since she had a lot of wheezing, but continued to have problems with shortness of breath and wheezing.  After starting treatment for heart failure she has not required her inhaler and has not had any wheezing problems.  She has been under some emotional stress and has started smoking again, although she is very more so about this and really wants to quit again.  She has NYHA functional class II exertional dyspnea.  She does not have orthopnea or PND or lower extremity edema.  She has not had chest pain.  Denies palpitations, dizziness or syncope.  Her current weight is 164 pounds.  During her hospitalization echocardiogram showed LVEF 40-45% with global hypokinesis and a mildly dilated left atrium, no significant valve abnormalities.  A follow-up echocardiogram on medications some 02/28/2024 showed LVEF was even lower at 30-35%, although side-by-side comparison does not show a big change between the 2 studies..  Catheterization showed normal coronary arteries.  After diuresis she had normal hemodynamics at right heart catheterization on 12/14/2023 when she weighed 160 pounds.  Her Entresto  had to be stopped due to hyperkalemia.  Note that she did not have problems with hyperkalemia when taking lisinopril , albeit in a very low dose of 2.5 mg daily.  She is currently taking carvedilol  and Farxiga  and a  very low-dose of furosemide ..    Studies Reviewed     Echocardiogram 02/28/2024  1. Left ventricular ejection fraction, by estimation, is 30 to 35%. The  left ventricle has moderately decreased function. The left ventricle  demonstrates global hypokinesis. There is mild left ventricular  hypertrophy. Left ventricular diastolic  parameters are consistent with Grade I diastolic dysfunction (impaired  relaxation).   2. Right ventricular systolic function is normal. The right ventricular  size is normal. There is normal pulmonary artery systolic pressure. The  estimated right ventricular systolic pressure is 19.8 mmHg.   3. The mitral valve is grossly normal. Mild mitral valve regurgitation.  No evidence of mitral stenosis.   4. The aortic valve is grossly normal. Aortic valve regurgitation is not  visualized. No aortic stenosis is present.   5. The inferior vena cava is normal in size with greater than 50%  respiratory variability, suggesting right atrial pressure of 3 mmHg.   Comparison(s): Prior images reviewed side by side. Accounting for  differences in HR between the two exams, no significant change has occured  in LV function despite differences in reporting.   Echocardiogram 12/13/2023  1. Left ventricular ejection fraction, by estimation, is 40 to 45%. The  left ventricle has mildly decreased function. The left ventricle  demonstrates global hypokinesis. Left ventricular diastolic parameters are  consistent with Grade I diastolic  dysfunction (impaired relaxation).   2. Right ventricular systolic function is normal. The right ventricular  size is normal.   3. Left atrial size was mildly dilated.   4. The mitral valve is normal  in structure. Trivial mitral valve  regurgitation. No evidence of mitral stenosis.   5. The aortic valve is normal in structure. Aortic valve regurgitation is  not visualized. No aortic stenosis is present.   6. The inferior vena cava is normal in  size with greater than 50%  respiratory variability, suggesting right atrial pressure of 3 mmHg.  Cardiac catheterization 12/14/2023 Normal coronary arteries Normal LV filling pressures. LVEDP 12 mm Hg, PCWP 16/15, mean 14 mm Hg Normal right heart pressures. PAP 29/14, mean 24 mm Hg Normal cardiac output 5.42 L/min, index 3.15  Risk Assessment/Calculations           Physical Exam VS:  BP 104/60   Pulse 90   Ht 5' 1 (1.549 m)   Wt 164 lb (74.4 kg)   SpO2 97%   BMI 30.99 kg/m        Wt Readings from Last 3 Encounters:  04/07/24 164 lb (74.4 kg)  01/05/24 164 lb (74.4 kg)  12/30/23 163 lb 3.2 oz (74 kg)    GEN: Well nourished, well developed in no acute distress NECK: No JVD; No carotid bruits CARDIAC: RRR, no murmurs, rubs, gallops RESPIRATORY:  Clear to auscultation without rales, wheezing or rhonchi  ABDOMEN: Soft, non-tender, non-distended EXTREMITIES:  No edema; No deformity   ASSESSMENT AND PLAN HFREF: Etiology unclear.  Her blood pressure was minimally elevated above normal range.  She does not have coronary artery disease.  She does have a left bundle branch block and her reduced EF is partly explained by dyssynchrony.  May benefit from a cardiac MRI.  It is disappointing that she had to be taken off Entresto , but she was able to tolerate a low-dose of ACE inhibitor without hypokalemia.  Will start losartan  25 mg once daily and recheck labs shortly.  Will avoid spironolactone due to the potassium abnormalities.  Continue carvedilol  and Farxiga .  Monitor weight daily and report if her weight increases over 165 pounds.  Reinforced sodium restricted diet. LBBB: Option for referral for CRT-D if EF does not improve once we have maximized therapy. HTN: BP is actually borderline low.  There may not be a lot of room for escalation of medical therapy. PreDM: A1c borderline at 5.8%.  Lipid profile looks pretty good, continue atorvastatin . Aortic atherosclerosis: Incidentally noted  on imaging studies, but she did not have any significant coronary disease. Mild normocytic anemia: Chronic.  Iron studies did not show evidence of deficiency.       Dispo:  Patient Instructions  Medication Instructions:  Losartan  25 mg daily *If you need a refill on your cardiac medications before your next appointment, please call your pharmacy*  Lab Work: BMP, BNP- 2-3 weeks If you have labs (blood work) drawn today and your tests are completely normal, you will receive your results only by: MyChart Message (if you have MyChart) OR A paper copy in the mail If you have any lab test that is abnormal or we need to change your treatment, we will call you to review the results.  Testing/Procedures: Your physician has requested that you have an echocardiogram in 6 weeks. Echocardiography is a painless test that uses sound waves to create images of your heart. It provides your doctor with information about the size and shape of your heart and how well your hearts chambers and valves are working. This procedure takes approximately one hour. There are no restrictions for this procedure. Please do NOT wear cologne, perfume, aftershave, or lotions (deodorant is allowed). Please  arrive 15 minutes prior to your appointment time.  Please note: We ask at that you not bring children with you during ultrasound (echo/ vascular) testing. Due to room size and safety concerns, children are not allowed in the ultrasound rooms during exams. Our front office staff cannot provide observation of children in our lobby area while testing is being conducted. An adult accompanying a patient to their appointment will only be allowed in the ultrasound room at the discretion of the ultrasound technician under special circumstances. We apologize for any inconvenience.   Follow-Up: At [facility], you and your health needs are our priority.  As part of our continuing mission to provide you with exceptional heart  care, our providers are all part of one team.  This team includes your primary Cardiologist (physician) and Advanced Practice Providers or APPs (Physician Assistants and Nurse Practitioners) who all work together to provide you with the care you need, when you need it.  Your next appointment:    Rollo Louder, PA-C- 6 weeks- after the Echo  Dr Francyne- 3-4 months  We recommend signing up for the patient portal called MyChart.  Sign up information is provided on this After Visit Summary.  MyChart is used to connect with patients for Virtual Visits (Telemedicine).  Patients are able to view lab/test results, encounter notes, upcoming appointments, etc.  Non-urgent messages can be sent to your provider as well.   To learn more about what you can do with MyChart, go to [email or web address].           Signed, Jerel Francyne, MD   "

## 2024-05-19 ENCOUNTER — Ambulatory Visit (HOSPITAL_COMMUNITY)

## 2024-05-23 ENCOUNTER — Ambulatory Visit: Admitting: Cardiology

## 2024-07-26 ENCOUNTER — Ambulatory Visit: Admitting: Cardiovascular Disease
